# Patient Record
Sex: Male | Born: 1955
Health system: Southern US, Community
[De-identification: ages and names within clinical notes are randomized; demographics above are authoritative.]

## PROBLEM LIST (undated history)

## (undated) DIAGNOSIS — J309 Allergic rhinitis, unspecified: Secondary | ICD-10-CM

## (undated) DIAGNOSIS — I10 Essential (primary) hypertension: Secondary | ICD-10-CM

## (undated) DIAGNOSIS — K603 Anal fistula, unspecified: Secondary | ICD-10-CM

## (undated) DIAGNOSIS — G8929 Other chronic pain: Secondary | ICD-10-CM

## (undated) DIAGNOSIS — R0600 Dyspnea, unspecified: Secondary | ICD-10-CM

## (undated) DIAGNOSIS — M1990 Unspecified osteoarthritis, unspecified site: Secondary | ICD-10-CM

## (undated) DIAGNOSIS — K219 Gastro-esophageal reflux disease without esophagitis: Secondary | ICD-10-CM

## (undated) DIAGNOSIS — G4733 Obstructive sleep apnea (adult) (pediatric): Secondary | ICD-10-CM

## (undated) DIAGNOSIS — E1142 Type 2 diabetes mellitus with diabetic polyneuropathy: Secondary | ICD-10-CM

## (undated) DIAGNOSIS — H269 Unspecified cataract: Secondary | ICD-10-CM

## (undated) DIAGNOSIS — E119 Type 2 diabetes mellitus without complications: Secondary | ICD-10-CM

## (undated) DIAGNOSIS — Z9989 Dependence on other enabling machines and devices: Secondary | ICD-10-CM

## (undated) DIAGNOSIS — Z9889 Other specified postprocedural states: Secondary | ICD-10-CM

## (undated) DIAGNOSIS — IMO0002 Reserved for concepts with insufficient information to code with codable children: Secondary | ICD-10-CM

## (undated) DIAGNOSIS — E785 Hyperlipidemia, unspecified: Secondary | ICD-10-CM

## (undated) DIAGNOSIS — R112 Nausea with vomiting, unspecified: Secondary | ICD-10-CM

## (undated) HISTORY — DX: Morbid (severe) obesity due to excess calories: E66.01

## (undated) HISTORY — PX: CERVICAL FUSION: SHX112

## (undated) HISTORY — DX: Hyperlipidemia, unspecified: E78.5

## (undated) HISTORY — DX: Essential (primary) hypertension: I10

## (undated) HISTORY — DX: Allergic rhinitis, unspecified: J30.9

## (undated) HISTORY — DX: Other chronic pain: G89.29

## (undated) HISTORY — DX: Type 2 diabetes mellitus with diabetic polyneuropathy: E11.42

## (undated) HISTORY — DX: Unspecified cataract: H26.9

## (undated) HISTORY — PX: SHOULDER ARTHROSCOPY WITH ROTATOR CUFF REPAIR: SHX5685

## (undated) HISTORY — DX: Unspecified osteoarthritis, unspecified site: M19.90

## (undated) HISTORY — DX: Type 2 diabetes mellitus without complications: E11.9

---

## 1998-02-11 DIAGNOSIS — A084 Viral intestinal infection, unspecified: Secondary | ICD-10-CM

## 1998-02-11 HISTORY — DX: Viral intestinal infection, unspecified: A08.4

## 1999-07-26 ENCOUNTER — Emergency Department (HOSPITAL_COMMUNITY): Admission: EM | Admit: 1999-07-26 | Discharge: 1999-07-26 | Payer: Self-pay | Admitting: Emergency Medicine

## 1999-07-27 ENCOUNTER — Encounter: Payer: Self-pay | Admitting: Emergency Medicine

## 2001-06-17 ENCOUNTER — Emergency Department (HOSPITAL_COMMUNITY): Admission: EM | Admit: 2001-06-17 | Discharge: 2001-06-17 | Payer: Self-pay | Admitting: *Deleted

## 2001-07-28 ENCOUNTER — Ambulatory Visit (HOSPITAL_BASED_OUTPATIENT_CLINIC_OR_DEPARTMENT_OTHER): Admission: RE | Admit: 2001-07-28 | Discharge: 2001-07-28 | Payer: Self-pay | Admitting: Urology

## 2001-07-28 HISTORY — PX: OTHER SURGICAL HISTORY: SHX169

## 2002-01-01 ENCOUNTER — Ambulatory Visit (HOSPITAL_COMMUNITY): Admission: RE | Admit: 2002-01-01 | Discharge: 2002-01-01 | Payer: Self-pay | Admitting: *Deleted

## 2002-01-01 ENCOUNTER — Encounter: Payer: Self-pay | Admitting: *Deleted

## 2002-02-16 ENCOUNTER — Encounter: Payer: Self-pay | Admitting: *Deleted

## 2002-02-16 ENCOUNTER — Emergency Department (HOSPITAL_COMMUNITY): Admission: EM | Admit: 2002-02-16 | Discharge: 2002-02-16 | Payer: Self-pay | Admitting: *Deleted

## 2002-04-09 ENCOUNTER — Encounter: Payer: Self-pay | Admitting: Family Medicine

## 2002-04-09 ENCOUNTER — Ambulatory Visit (HOSPITAL_COMMUNITY): Admission: RE | Admit: 2002-04-09 | Discharge: 2002-04-09 | Payer: Self-pay | Admitting: Family Medicine

## 2006-06-13 ENCOUNTER — Ambulatory Visit: Payer: Self-pay | Admitting: Internal Medicine

## 2008-02-12 HISTORY — PX: LUMBAR FUSION: SHX111

## 2008-05-04 ENCOUNTER — Ambulatory Visit: Admission: RE | Admit: 2008-05-04 | Discharge: 2008-05-04 | Payer: Self-pay | Admitting: Family Medicine

## 2008-11-24 ENCOUNTER — Ambulatory Visit (HOSPITAL_COMMUNITY): Admission: RE | Admit: 2008-11-24 | Discharge: 2008-11-24 | Payer: Self-pay | Admitting: Neurosurgery

## 2010-06-26 NOTE — Procedures (Signed)
NAMEJOSPH, Evan Smith              ACCOUNT NO.:  [id number]   MEDICAL RECORD NO.:  [id number]          PATIENT TYPE:  OUT   LOCATION:  SLEE                          FACILITY:  [facility]   PHYSICIAN:  Kofi A. Gerilyn Pilgrim, M.D. DATE OF BIRTH:  Jun 12, 1955   DATE OF PROCEDURE:  05/04/2008  DATE OF DISCHARGE:  05/04/2008                             SLEEP DISORDER REPORT   REFERRING PHYSICIAN:  Ernestina Penna, M.D.   INDICATIONS:  A 52-year lady who presents with hyposomnia, snoring,  witnessed apnea and is being evaluated for obstructive sleep apnea  syndrome.   MEDICATIONS:  Singulair, amlodipine, benazepril, aspirin.   Epworth sleepiness scale 18.  BMI 42.   ARCHITECTURAL SUMMARY:  This is a split night recording with the first  half being a diagnostic and the second a titration. The total recording  time is 392 minutes.  Sleep efficiency 89%, sleep latency 13 minutes,  REM latency 53 minutes.   RESPIRATORY SUMMARY:  Baseline oxygen saturation 97%.  Lowest saturation  84%. The diagnostic AHI is 23.  The patient was titrated pressures  between 5 and 16 with the optimal pressure being 16 resulting in  resolution of obstructive events.   LIMB MOVEMENT SUMMARY:  PLM index 0.   ELECTROCARDIOGRAM SUMMARY:  Average heart rate 77 with isolated PVCs  observed.   IMPRESSION:  Moderate obstructive sleep apnea syndrome which responds  well to a CPAP of 16.  Thanks for this referral      Kofi A. Gerilyn Pilgrim, M.D.  Electronically Signed     KAD/MEDQ  D:  05/16/2008  T:  05/16/2008  Job:  [id number]

## 2010-06-29 NOTE — Op Note (Signed)
[facility]  Patient:    Evan Smith, Evan Smith Visit Number: [id number] MRN: [id number]          Service Type: NES Location: NESC Attending Physician:  Evlyn Clines Dictated by:   Excell Seltzer. Annabell Howells, M.D. Proc. Date: 07/28/01 Admit Date:  07/28/2001   CC:         Caren Macadam, M.D. [facility]   Operative Report  PROCEDURE:  Right hydrocelectomy.  PREOPERATIVE DIAGNOSIS:  Right hydrocele.  POSTOPERATIVE DIAGNOSIS:  Right hydrocele.  SURGEON:  Excell Seltzer. Annabell Howells, M.D.  ANESTHESIA:  General.  COMPLICATIONS:  None.  DRAIN:  Penrose.  INDICATIONS:  Mr. Melhorn is a 55 year old black male with a grapefruit-size, symptomatic right hydrocele.  He is to undergo hydrocelectomy.  FINDINGS AND PROCEDURE:  He was taken to the operating room where he was placed in a supine position.  A general anesthetic was induced.  His scrotum was shaved.  He was prepped with Betadine solution and draped in the usual sterile fashion.  An oblique right anterior scrotal incision was made along the skin folds with a knife.  This was carried down through the dartos with the Bovie.  The testicle within the hydrocele sac was delivered from the wound.  The hydrocele sac was opened, and the redundant sac was excised.  The sac was then embrocated behind the testicle in a water-bottle fashion with a running lock 3-0 chromic stitch.  Once hemostasis had been achieved, the testicle was delivered back into the right hemiscrotum.  A quarter-inch Penrose drain was placed through a separate stab wound in the dependent portion of the scrotum.  It was initially secured with a towel clip.  The wound was then closed in two layers using a running 3-0 chromic on the dartos and a running vertical mattress 3-0 chromic on the skin.  The drain was trimmed to length.  The towel clip was removed.  A dressing of 4 x 4 fluff Kerlix and a scrotal support was applied.  The  patients anesthetic was then reversed.  He was removed to the recovery room in stable condition.  There were no complications. Dictated by:   Excell Seltzer. Annabell Howells, M.D. Attending Physician:  Evlyn Clines DD:  07/28/01 TD:  07/29/01 Job: [id number]

## 2012-02-06 ENCOUNTER — Encounter (HOSPITAL_COMMUNITY): Payer: Self-pay | Admitting: Emergency Medicine

## 2012-02-06 ENCOUNTER — Emergency Department (HOSPITAL_COMMUNITY)
Admission: EM | Admit: 2012-02-06 | Discharge: 2012-02-06 | Disposition: A | Discharge status: Home / Self Care | Payer: Medicare Other | Attending: Emergency Medicine | Admitting: Emergency Medicine

## 2012-02-06 DIAGNOSIS — Z87891 Personal history of nicotine dependence: Secondary | ICD-10-CM | POA: Insufficient documentation

## 2012-02-06 DIAGNOSIS — R197 Diarrhea, unspecified: Secondary | ICD-10-CM | POA: Insufficient documentation

## 2012-02-06 DIAGNOSIS — Z79899 Other long term (current) drug therapy: Secondary | ICD-10-CM | POA: Insufficient documentation

## 2012-02-06 DIAGNOSIS — Z7982 Long term (current) use of aspirin: Secondary | ICD-10-CM | POA: Insufficient documentation

## 2012-02-06 DIAGNOSIS — K649 Unspecified hemorrhoids: Secondary | ICD-10-CM

## 2012-02-06 DIAGNOSIS — K644 Residual hemorrhoidal skin tags: Secondary | ICD-10-CM | POA: Insufficient documentation

## 2012-02-06 MED ORDER — DIPHENOXYLATE-ATROPINE 2.5-0.025 MG PO TABS
2.0000 | ORAL_TABLET | Freq: Once | ORAL | Status: AC
  Administered 2012-02-06: 2 via ORAL
  Filled 2012-02-06: qty 2

## 2012-02-06 MED ORDER — LIDOCAINE VISCOUS 2 % MT SOLN
OROMUCOSAL | Status: AC
  Filled 2012-02-06: qty 15

## 2012-02-06 MED ORDER — PRAMOXINE HCL 1 % RE FOAM: RECTAL | Status: DC | PRN

## 2012-02-06 MED ORDER — HYDROCORTISONE ACETATE 25 MG RE SUPP
25.0000 mg | Freq: Two times a day (BID) | RECTAL | Status: DC
  Filled 2012-02-06 (×3): qty 1

## 2012-02-06 MED ORDER — ONDANSETRON 4 MG PO TBDP
4.0000 mg | ORAL_TABLET | Freq: Once | ORAL | Status: AC
  Administered 2012-02-06: 4 mg via ORAL
  Filled 2012-02-06: qty 1

## 2012-02-06 MED ORDER — HYDROCODONE-ACETAMINOPHEN 5-325 MG PO TABS
1.0000 | ORAL_TABLET | Freq: Once | ORAL | Status: AC
  Administered 2012-02-06: 1 via ORAL
  Filled 2012-02-06: qty 1

## 2012-02-06 MED ORDER — HYDROCORTISONE ACETATE 25 MG RE SUPP: 25.0000 mg | Freq: Two times a day (BID) | RECTAL | Status: DC

## 2012-02-06 MED ORDER — HYDROCORTISONE 2.5 % RE CREA: TOPICAL_CREAM | RECTAL | Status: DC

## 2012-02-06 NOTE — ED Notes (Addendum)
Has tried creams, suppositories, "everything" with no relief.  Has small spots of bleeding from rectum at intervals.  Pain at all times - walking, sitting and currently while positioned on right side.

## 2012-02-06 NOTE — ED Provider Notes (Signed)
History     CSN: [id number]  Arrival date & time 02/06/12  1478   First MD Initiated Contact with Patient 02/06/12 [phone]      Chief Complaint  Patient presents with  . Hemorrhoids    (Consider location/radiation/quality/duration/timing/severity/associated sxs/prior treatment) HPI Evan Smith is a 56 y.o. male who presents to the Emergency Department complaining of diarrhea illness resulting in  hemorrhoid pain for a week. No nausea or vomiting. Hemorroids now very painful with wiping.  History reviewed. No pertinent past medical history.  Past Surgical History  Procedure Date  . Back surgery   . Shoulder arthroscopy with rotator cuff repair     LEFT  . Spine surgery     screws  - due to injury - ruptured disc  . Cervical disc surgery   . Hydrocele excision     No family history on file.  History  Substance Use Topics  . Smoking status: Former Games developer  . Smokeless tobacco: Not on file  . Alcohol Use: Yes     Comment: occassional      Review of Systems  Constitutional: Negative for fever.       10 Systems reviewed and are negative for acute change except as noted in the HPI.  HENT: Negative for congestion.   Eyes: Negative for discharge and redness.  Respiratory: Negative for cough and shortness of breath.   Cardiovascular: Negative for chest pain.  Gastrointestinal: Positive for rectal pain. Negative for vomiting and abdominal pain.  Musculoskeletal: Negative for back pain.  Skin: Negative for rash.  Neurological: Negative for syncope, numbness and headaches.  Psychiatric/Behavioral:       No behavior change.    Allergies  Hydrocodeine  Home Medications   Current Outpatient Rx  Name  Route  Sig  Dispense  Refill  . ASPIRIN 81 MG PO TABS   Oral   Take 81 mg by mouth daily.         . CHOLECALCIFEROL 400 UNITS PO TABS   Oral   Take 2,000 Units by mouth.         . MELOXICAM 15 MG PO TABS   Oral   Take 15 mg by mouth daily.         Marland Kitchen  OLMESARTAN MEDOXOMIL-HCTZ 40-25 MG PO TABS   Oral   Take 1 tablet by mouth daily.         Marland Kitchen OMEPRAZOLE 40 MG PO CPDR   Oral   Take 40 mg by mouth daily.         Marland Kitchen SIMVASTATIN 20 MG PO TABS   Oral   Take 20 mg by mouth every evening.           BP 125/70  Pulse 90  Temp 98.7 F (37.1 C) (Oral)  Resp 20  Ht 5\' 8"  (1.727 m)  Wt 300 lb (136.079 kg)  BMI 45.61 kg/m2  SpO2 97%  Physical Exam  Nursing note and vitals reviewed. Constitutional:       Awake, alert, nontoxic appearance.  HENT:  Head: Atraumatic.  Eyes: Right eye exhibits no discharge. Left eye exhibits no discharge.  Neck: Neck supple.  Cardiovascular: Normal heart sounds.   Pulmonary/Chest: Effort normal and breath sounds normal. He exhibits no tenderness.  Abdominal: Soft. There is no tenderness. There is no rebound.  Genitourinary:       External hemorrhoid with erythema, no thrombosis. Extremely tender with digital exam. Hemorrhoids at 4 and 8 o'clock position, no thrombosis.  Musculoskeletal:  He exhibits no tenderness.       Baseline ROM, no obvious new focal weakness.  Neurological:       Mental status and motor strength appears baseline for patient and situation.  Skin: No rash noted.  Psychiatric: He has a normal mood and affect.    ED Course  Procedures (including critical care time)     MDM  Patient with diarrheal illness causing hemorrhoid pain. Given analgesic, antiemetic, antidiarrheal . Pt stable in ED with no significant deterioration in condition.The patient appears reasonably screened and/or stabilized for discharge and I doubt any other medical condition or other [facility] requiring further screening, evaluation, or treatment in the ED at this time prior to discharge.  MDM Reviewed: nursing note and vitals           Nicoletta Dress. Colon Branch, MD 02/06/12 0630

## 2012-02-06 NOTE — ED Notes (Signed)
Onset of painful hemorrhoids a week ago, then two days ago more frequent bowel movements - loose and caused irritation to rectal area.  Painful now, cannot tolerate tissue touching him

## 2012-02-10 ENCOUNTER — Encounter (HOSPITAL_COMMUNITY): Payer: Self-pay | Admitting: Emergency Medicine

## 2012-02-10 ENCOUNTER — Emergency Department (HOSPITAL_COMMUNITY)
Admission: EM | Admit: 2012-02-10 | Discharge: 2012-02-10 | Disposition: A | Discharge status: Home / Self Care | Payer: Medicare Other | Attending: Emergency Medicine | Admitting: Emergency Medicine

## 2012-02-10 ENCOUNTER — Encounter (HOSPITAL_COMMUNITY): Payer: Self-pay | Admitting: *Deleted

## 2012-02-10 ENCOUNTER — Emergency Department (HOSPITAL_COMMUNITY): Payer: Medicare Other

## 2012-02-10 DIAGNOSIS — R198 Other specified symptoms and signs involving the digestive system and abdomen: Secondary | ICD-10-CM | POA: Insufficient documentation

## 2012-02-10 DIAGNOSIS — K649 Unspecified hemorrhoids: Secondary | ICD-10-CM

## 2012-02-10 DIAGNOSIS — K612 Anorectal abscess: Secondary | ICD-10-CM | POA: Insufficient documentation

## 2012-02-10 DIAGNOSIS — Z9889 Other specified postprocedural states: Secondary | ICD-10-CM | POA: Insufficient documentation

## 2012-02-10 DIAGNOSIS — Z7982 Long term (current) use of aspirin: Secondary | ICD-10-CM | POA: Insufficient documentation

## 2012-02-10 DIAGNOSIS — Z79899 Other long term (current) drug therapy: Secondary | ICD-10-CM | POA: Insufficient documentation

## 2012-02-10 DIAGNOSIS — Z87891 Personal history of nicotine dependence: Secondary | ICD-10-CM | POA: Insufficient documentation

## 2012-02-10 DIAGNOSIS — K6289 Other specified diseases of anus and rectum: Secondary | ICD-10-CM | POA: Insufficient documentation

## 2012-02-10 DIAGNOSIS — K5732 Diverticulitis of large intestine without perforation or abscess without bleeding: Secondary | ICD-10-CM | POA: Insufficient documentation

## 2012-02-10 DIAGNOSIS — K610 Anal abscess: Secondary | ICD-10-CM

## 2012-02-10 DIAGNOSIS — K5792 Diverticulitis of intestine, part unspecified, without perforation or abscess without bleeding: Secondary | ICD-10-CM

## 2012-02-10 DIAGNOSIS — K5900 Constipation, unspecified: Secondary | ICD-10-CM | POA: Insufficient documentation

## 2012-02-10 DIAGNOSIS — Z8669 Personal history of other diseases of the nervous system and sense organs: Secondary | ICD-10-CM | POA: Insufficient documentation

## 2012-02-10 LAB — BASIC METABOLIC PANEL
Calcium: 9.9 mg/dL (ref 8.4–10.5)
GFR calc Af Amer: 61 mL/min — ABNORMAL LOW (ref >90)
GFR calc non Af Amer: 53 mL/min — ABNORMAL LOW (ref >90)
Glucose, Bld: 105 mg/dL — ABNORMAL HIGH (ref 70–99)
Potassium: 4.4 mEq/L (ref 3.5–5.1)
Sodium: 137 mEq/L (ref 135–145)

## 2012-02-10 LAB — CBC WITH DIFFERENTIAL/PLATELET
Basophils Absolute: 0 10*3/uL (ref 0.0–0.1)
Basophils Relative: 0 % (ref 0–1)
Eosinophils Absolute: 0.1 10*3/uL (ref 0.0–0.7)
Lymphs Abs: 1.4 10*3/uL (ref 0.7–4.0)
MCH: 31.8 pg (ref 26.0–34.0)
Neutrophils Relative %: 76 % (ref 43–77)
Platelets: 202 10*3/uL (ref 150–400)
RBC: 4.06 MIL/uL — ABNORMAL LOW (ref 4.22–5.81)
RDW: 13.3 % (ref 11.5–15.5)

## 2012-02-10 MED ORDER — HYDROCODONE-ACETAMINOPHEN 5-325 MG PO TABS
1.0000 | ORAL_TABLET | Freq: Once | ORAL | Status: AC
  Administered 2012-02-10: 1 via ORAL
  Filled 2012-02-10: qty 1

## 2012-02-10 MED ORDER — OXYCODONE-ACETAMINOPHEN 5-325 MG PO TABS
1.0000 | ORAL_TABLET | Freq: Once | ORAL | Status: AC
  Administered 2012-02-10: 1 via ORAL
  Filled 2012-02-10: qty 1

## 2012-02-10 MED ORDER — ONDANSETRON HCL 4 MG/2ML IJ SOLN: 4.0000 mg | Freq: Once | INTRAMUSCULAR | Status: DC

## 2012-02-10 MED ORDER — HYDROMORPHONE HCL PF 1 MG/ML IJ SOLN: 0.5000 mg | Freq: Once | INTRAMUSCULAR | Status: DC

## 2012-02-10 MED ORDER — METRONIDAZOLE 500 MG PO TABS: 500.0000 mg | ORAL_TABLET | Freq: Four times a day (QID) | ORAL | Status: DC

## 2012-02-10 MED ORDER — HYDROCODONE-ACETAMINOPHEN 5-325 MG PO TABS: 1.0000 | ORAL_TABLET | ORAL | Status: DC | PRN

## 2012-02-10 MED ORDER — CIPROFLOXACIN HCL 750 MG PO TABS: 750.0000 mg | ORAL_TABLET | Freq: Two times a day (BID) | ORAL | Status: DC

## 2012-02-10 MED ORDER — ONDANSETRON 8 MG PO TBDP: 8.0000 mg | ORAL_TABLET | Freq: Three times a day (TID) | ORAL | Status: DC | PRN

## 2012-02-10 MED ORDER — SODIUM CHLORIDE 0.9 % IV BOLUS (SEPSIS): 500.0000 mL | Freq: Once | INTRAVENOUS | Status: DC

## 2012-02-10 MED ORDER — POLYETHYLENE GLYCOL 3350 17 GM/SCOOP PO POWD: 17.0000 g | Freq: Every day | ORAL | Status: DC

## 2012-02-10 NOTE — ED Notes (Signed)
Pt sent from Dr. Malvin Johns office for rectal abscess.

## 2012-02-10 NOTE — ED Notes (Signed)
Pt returning to the er about hemorrhoids. Was seen this week about the same. Pt states so tender now he cant take it. Needing something for pain.

## 2012-02-10 NOTE — ED Notes (Signed)
Patient states he was sent over for evaluation of rectal abscess by Dr. Malvin Johns.

## 2012-02-10 NOTE — ED Provider Notes (Signed)
History     CSN: [id number]  Arrival date & time 02/10/12  1218   First MD Initiated Contact with Patient 02/10/12 1423      Chief Complaint  Patient presents with  . Abscess    (Consider location/radiation/quality/duration/timing/severity/associated sxs/prior treatment) Patient is a 56 y.o. male presenting with abscess. The history is provided by the patient.  Abscess  Pertinent negatives include no diarrhea and no vomiting.   patient has had some rectal pain over the last week. His been seen in ER twice and was diagnosed with hemorrhoids. He followed up today with Dr. Malvin Johns from general surgery. He reportedly put a probe or cotton swab internally in his rectum and had clot and purulence come out. Patient states he was sent in to get a CT scan. No fevers. He states he has had decreased bowel movements. He has not had episodes of this before. Dr. Malvin Johns called and states he does not want to see the patient in followup. No abdominal pain.  History reviewed. No pertinent past medical history.  Past Surgical History  Procedure Date  . Back surgery   . Shoulder arthroscopy with rotator cuff repair     LEFT  . Spine surgery     screws  - due to injury - ruptured disc  . Cervical disc surgery   . Hydrocele excision     History reviewed. No pertinent family history.  History  Substance Use Topics  . Smoking status: Former Games developer  . Smokeless tobacco: Not on file  . Alcohol Use: Yes     Comment: occassional      Review of Systems  Constitutional: Negative for activity change and appetite change.  HENT: Negative for neck stiffness.   Eyes: Negative for pain.  Respiratory: Negative for chest tightness and shortness of breath.   Cardiovascular: Negative for chest pain and leg swelling.  Gastrointestinal: Positive for constipation and rectal pain. Negative for nausea, vomiting, abdominal pain and diarrhea.  Genitourinary: Negative for flank pain and discharge.    Musculoskeletal: Negative for back pain.  Skin: Negative for rash.  Neurological: Negative for weakness, numbness and headaches.  Psychiatric/Behavioral: Negative for behavioral problems.    Allergies  Hydrocodeine  Home Medications   Current Outpatient Rx  Name  Route  Sig  Dispense  Refill  . OLMESARTAN-AMLODIPINE-HCTZ 40-5-25 MG PO TABS   Oral   Take 1 tablet by mouth daily.         . ASPIRIN 81 MG PO TABS   Oral   Take 81 mg by mouth daily.         . CHOLECALCIFEROL 400 UNITS PO TABS   Oral   Take 2,000 Units by mouth.         Marland Kitchen CIPROFLOXACIN HCL 750 MG PO TABS   Oral   Take 1 tablet (750 mg total) by mouth 2 (two) times daily.   20 tablet   0   . HYDROCODONE-ACETAMINOPHEN 5-325 MG PO TABS   Oral   Take 1 tablet by mouth every 4 (four) hours as needed for pain.   15 tablet   0   . HYDROCORTISONE ACETATE 25 MG RE SUPP   Rectal   Place 1 suppository (25 mg total) rectally 2 (two) times daily.   12 suppository   0   . MELOXICAM 15 MG PO TABS   Oral   Take 15 mg by mouth daily.         Marland Kitchen METRONIDAZOLE 500 MG PO TABS  Oral   Take 1 tablet (500 mg total) by mouth 4 (four) times daily.   50 tablet   0   . OMEPRAZOLE 40 MG PO CPDR   Oral   Take 40 mg by mouth daily.         Marland Kitchen POLYETHYLENE GLYCOL 3350 PO POWD   Oral   Take 17 g by mouth daily.   255 g   0   . PRAMOXINE HCL 1 % RE FOAM   Rectal   Place rectally every 2 (two) hours as needed for hemorrhoids.   15 g   0   . SIMVASTATIN 20 MG PO TABS   Oral   Take 20 mg by mouth every evening.           BP 118/58  Pulse 98  Temp 99.3 F (37.4 C) (Oral)  Resp 20  Ht 5\' 8"  (1.727 m)  Wt 300 lb (136.079 kg)  BMI 45.61 kg/m2  SpO2 99%  Physical Exam  Nursing note and vitals reviewed. Constitutional: He is oriented to person, place, and time. He appears well-developed and well-nourished.  HENT:  Head: Normocephalic and atraumatic.  Eyes: EOM are normal. Pupils are equal,  round, and reactive to light.  Neck: Normal range of motion. Neck supple.  Cardiovascular: Normal rate, regular rhythm and normal heart sounds.   No murmur heard. Pulmonary/Chest: Effort normal and breath sounds normal.  Abdominal: Soft. Bowel sounds are normal. He exhibits no distension and no mass. There is no tenderness. There is no rebound and no guarding.       Some tenderness on external rectal exam. Internal rectal exam not done.  Musculoskeletal: Normal range of motion. He exhibits no edema.  Neurological: He is alert and oriented to person, place, and time. No cranial nerve deficit.  Skin: Skin is warm and dry.  Psychiatric: He has a normal mood and affect.    ED Course  Procedures (including critical care time)  Labs Reviewed  CBC WITH DIFFERENTIAL - Abnormal; Notable for the following:    RBC 4.06 (*)     Hemoglobin 12.9 (*)     HCT 37.8 (*)     All other components within normal limits  BASIC METABOLIC PANEL - Abnormal; Notable for the following:    Glucose, Bld 105 (*)     Creatinine, Ser 1.44 (*)     GFR calc non Af Amer 53 (*)     GFR calc Af Amer 61 (*)     All other components within normal limits   Ct Pelvis Wo Contrast  02/10/2012  *RADIOLOGY REPORT*  Clinical Data:  Chronic rectal pain.  Worsening over the past week. Question perirectal abscess.  Unable to obtain IV contrast.  CT PELVIS WITHOUT CONTRAST  Technique:  Multidetector CT imaging of the pelvis was performed following the standard protocol without intravenous contrast.  Comparison:  None  Findings:  Decrease sensitivity exam secondary to unenhanced technique.  There is subtle asymmetry to the perianal fat plans, eccentric right, including on image 52/series 2.  No well-defined fluid or gas collection is seen.  The appearance of the rectum and perirectal fat is normal.  Sigmoid diverticulosis.  A suggestion of mild pericolonic edema and fascial thickening adjacent the descending/sigmoid junction, including  on image 18/series 2.  Normal terminal ileum and appendix.  Pelvic small bowel loops are within normal limits.  Mildly prominent bilateral inguinal nodes, which are likely reactive. Normal urinary bladder and prostate.  No significant free  fluid.  Lumbosacral spine fixation.  Right-sided iliac sclerosis is favored to be degenerative.  Similar to 2010.  IMPRESSION:  1.  Low sensitivity exam, secondary to unenhanced CT technique. 2.  Subtle soft tissue asymmetry about the right perianal region. Cannot exclude cellulitis or a tiny perianal abscess/fistula.  The test of choice is dedicated anorectal MRI with contrast.  This could be performed as an outpatient. 3.  Findings suspicious for descending/sigmoid colon junction diverticulitis.   Original Report Authenticated By: Jeronimo Greaves, M.D.      1. Perianal abscess   2. Diverticulitis       MDM  Patient sent from Dr. Daisy Blossom office. possible hernia or abscess. We have been unable to get IV access on him. CT scan was done with only oral contrast. It showed possible perianal cellulitis versus tiny abscess. No clear drainability on my exam. Patient also had diverticulitis on the CT. He is not tender on the abdomen, but has had some bowel symptoms. He'll be treated with antibiotics and will followup with Dr. Leretha Pol.Marland Kitchen He        Juliet Rude. Rubin Payor, MD 02/10/12 [phone]

## 2012-02-10 NOTE — ED Notes (Signed)
No IV access able to be obtained. MD aware, given verbal order to do CT without contrast.

## 2012-02-10 NOTE — ED Provider Notes (Signed)
History     CSN: [id number]  Arrival date & time 02/10/12  0448   First MD Initiated Contact with Patient 02/10/12 [phone]      Chief Complaint  Patient presents with  . Hemorrhoids    (Consider location/radiation/quality/duration/timing/severity/associated sxs/prior treatment) HPI Evan Smith is a 56 y.o. male who presents to the Emergency Department complaining of continuing hemorrhoid pain. He was seen in the ER last week and given both protofoam and suppositories. He has used both with some improvement in pain. The diarrhea illness he had last week has resolved. The pain with wiping has become worse. He is here for pain relief. He has been using Preparation H with no relief.  History reviewed. No pertinent past medical history.  Past Surgical History  Procedure Date  . Back surgery   . Shoulder arthroscopy with rotator cuff repair     LEFT  . Spine surgery     screws  - due to injury - ruptured disc  . Cervical disc surgery   . Hydrocele excision     No family history on file.  History  Substance Use Topics  . Smoking status: Former Games developer  . Smokeless tobacco: Not on file  . Alcohol Use: Yes     Comment: occassional      Review of Systems  Constitutional: Negative for fever.       10 Systems reviewed and are negative for acute change except as noted in the HPI.  HENT: Negative for congestion.   Eyes: Negative for discharge and redness.  Respiratory: Negative for cough and shortness of breath.   Cardiovascular: Negative for chest pain.  Gastrointestinal: Negative for vomiting and abdominal pain.       Anal pain  Musculoskeletal: Negative for back pain.  Skin: Negative for rash.  Neurological: Negative for syncope, numbness and headaches.  Psychiatric/Behavioral:       No behavior change.    Allergies  Hydrocodeine  Home Medications   Current Outpatient Rx  Name  Route  Sig  Dispense  Refill  . ASPIRIN 81 MG PO TABS   Oral   Take 81 mg by mouth  daily.         . CHOLECALCIFEROL 400 UNITS PO TABS   Oral   Take 2,000 Units by mouth.         Marland Kitchen HYDROCORTISONE 2.5 % RE CREA      Apply rectally 2 times daily   30 g   0   . HYDROCORTISONE ACETATE 25 MG RE SUPP   Rectal   Place 1 suppository (25 mg total) rectally 2 (two) times daily.   12 suppository   0   . MELOXICAM 15 MG PO TABS   Oral   Take 15 mg by mouth daily.         Marland Kitchen OLMESARTAN MEDOXOMIL-HCTZ 40-25 MG PO TABS   Oral   Take 1 tablet by mouth daily.         Marland Kitchen OMEPRAZOLE 40 MG PO CPDR   Oral   Take 40 mg by mouth daily.         Marland Kitchen PRAMOXINE HCL 1 % RE FOAM   Rectal   Place rectally every 2 (two) hours as needed for hemorrhoids.   15 g   0   . SIMVASTATIN 20 MG PO TABS   Oral   Take 20 mg by mouth every evening.           BP 149/79  Pulse 92  Temp 97.9 F (36.6 C) (Oral)  Resp 18  Ht 5\' 8"  (1.727 m)  Wt 300 lb (136.079 kg)  BMI 45.61 kg/m2  SpO2 97%  Physical Exam  Nursing note and vitals reviewed. Constitutional:       Awake, alert, nontoxic appearance.  HENT:  Head: Atraumatic.  Eyes: Right eye exhibits no discharge. Left eye exhibits no discharge.  Neck: Neck supple.  Cardiovascular: Normal heart sounds.   Pulmonary/Chest: Effort normal and breath sounds normal. He exhibits no tenderness.  Abdominal: Soft. There is no tenderness. There is no rebound.  Musculoskeletal: He exhibits no tenderness.       Baseline ROM, no obvious new focal weakness.  Neurological:       Mental status and motor strength appears baseline for patient and situation.  Skin: No rash noted.  Psychiatric: He has a normal mood and affect.    ED Course  Procedures (including critical care time)     MDM  Patient presents with continued hemorrhoidal pain. Given analgesic. Referral to general surgery. Pt stable in ED with no significant deterioration in condition.The patient appears reasonably screened and/or stabilized for discharge and I doubt any  other medical condition or other [facility] requiring further screening, evaluation, or treatment in the ED at this time prior to discharge.  MDM Reviewed: nursing note, previous chart and vitals            Nicoletta Dress. Colon Branch, MD 02/10/12 [phone]

## 2012-02-13 ENCOUNTER — Encounter (HOSPITAL_COMMUNITY)
Admission: EM | Disposition: A | Discharge status: Home / Self Care | Payer: Self-pay | Source: Home / Self Care | Attending: Emergency Medicine

## 2012-02-13 ENCOUNTER — Inpatient Hospital Stay: Admit: 2012-02-13 | Payer: Self-pay | Admitting: Surgery

## 2012-02-13 ENCOUNTER — Encounter (HOSPITAL_COMMUNITY): Payer: Self-pay | Admitting: Certified Registered"

## 2012-02-13 ENCOUNTER — Emergency Department (HOSPITAL_COMMUNITY): Payer: Medicare Other | Admitting: Certified Registered"

## 2012-02-13 ENCOUNTER — Encounter (HOSPITAL_COMMUNITY): Payer: Self-pay | Admitting: Cardiology

## 2012-02-13 ENCOUNTER — Observation Stay (HOSPITAL_COMMUNITY)
Admission: EM | Admit: 2012-02-13 | Discharge: 2012-02-16 | Disposition: A | Discharge status: Home / Self Care | Payer: Medicare Other | Attending: Surgery | Admitting: Surgery

## 2012-02-13 ENCOUNTER — Encounter (HOSPITAL_COMMUNITY): Payer: Self-pay | Admitting: Surgery

## 2012-02-13 DIAGNOSIS — I10 Essential (primary) hypertension: Secondary | ICD-10-CM | POA: Insufficient documentation

## 2012-02-13 DIAGNOSIS — K611 Rectal abscess: Secondary | ICD-10-CM | POA: Diagnosis present

## 2012-02-13 DIAGNOSIS — E785 Hyperlipidemia, unspecified: Secondary | ICD-10-CM | POA: Insufficient documentation

## 2012-02-13 DIAGNOSIS — K612 Anorectal abscess: Secondary | ICD-10-CM

## 2012-02-13 DIAGNOSIS — G4730 Sleep apnea, unspecified: Secondary | ICD-10-CM | POA: Insufficient documentation

## 2012-02-13 DIAGNOSIS — K6289 Other specified diseases of anus and rectum: Secondary | ICD-10-CM | POA: Insufficient documentation

## 2012-02-13 HISTORY — PX: INCISION AND DRAINAGE PERIRECTAL ABSCESS: SHX1804

## 2012-02-13 LAB — CBC WITH DIFFERENTIAL/PLATELET
Hemoglobin: 12.1 g/dL — ABNORMAL LOW (ref 13.0–17.0)
Lymphocytes Relative: 6 % — ABNORMAL LOW (ref 12–46)
Lymphs Abs: 1 10*3/uL (ref 0.7–4.0)
Monocytes Relative: 9 % (ref 3–12)
Neutro Abs: 15.6 10*3/uL — ABNORMAL HIGH (ref 1.7–7.7)
Neutrophils Relative %: 86 % — ABNORMAL HIGH (ref 43–77)
Platelets: 248 10*3/uL (ref 150–400)
RBC: 3.89 MIL/uL — ABNORMAL LOW (ref 4.22–5.81)
WBC: 18.2 10*3/uL — ABNORMAL HIGH (ref 4.0–10.5)

## 2012-02-13 LAB — BASIC METABOLIC PANEL
BUN: 28 mg/dL — ABNORMAL HIGH (ref 6–23)
Chloride: 95 mEq/L — ABNORMAL LOW (ref 96–112)
GFR calc non Af Amer: 37 mL/min — ABNORMAL LOW (ref >90)
Glucose, Bld: 110 mg/dL — ABNORMAL HIGH (ref 70–99)
Potassium: 4.1 mEq/L (ref 3.5–5.1)
Sodium: 134 mEq/L — ABNORMAL LOW (ref 135–145)

## 2012-02-13 SURGERY — INCISION AND DRAINAGE, ABSCESS, PERIRECTAL
Anesthesia: General | Site: Buttocks | Wound class: Dirty or Infected

## 2012-02-13 MED ORDER — SODIUM CHLORIDE 0.9 % IV SOLN
INTRAVENOUS | Status: DC
  Administered 2012-02-13: 13:00:00 via INTRAVENOUS

## 2012-02-13 MED ORDER — ONDANSETRON HCL 4 MG/2ML IJ SOLN
INTRAMUSCULAR | Status: DC | PRN
  Administered 2012-02-13: 8 mg via INTRAVENOUS

## 2012-02-13 MED ORDER — SODIUM CHLORIDE 0.9 % IR SOLN
Status: DC | PRN
  Administered 2012-02-13: 1

## 2012-02-13 MED ORDER — KCL IN DEXTROSE-NACL 30-5-0.45 MEQ/L-%-% IV SOLN
INTRAVENOUS | Status: DC
  Administered 2012-02-13: 20:00:00 via INTRAVENOUS
  Administered 2012-02-14: 1000 mL via INTRAVENOUS
  Filled 2012-02-13 (×7): qty 1000

## 2012-02-13 MED ORDER — MIDAZOLAM HCL 5 MG/5ML IJ SOLN
INTRAMUSCULAR | Status: DC | PRN
  Administered 2012-02-13: 2 mg via INTRAVENOUS

## 2012-02-13 MED ORDER — SODIUM CHLORIDE 0.9 % IV SOLN
3.0000 g | Freq: Four times a day (QID) | INTRAVENOUS | Status: DC
  Administered 2012-02-13 – 2012-02-14 (×3): 3 g via INTRAVENOUS
  Filled 2012-02-13 (×9): qty 3

## 2012-02-13 MED ORDER — FENTANYL CITRATE 0.05 MG/ML IJ SOLN
INTRAMUSCULAR | Status: DC | PRN
  Administered 2012-02-13: 50 ug via INTRAVENOUS
  Administered 2012-02-13: 100 ug via INTRAVENOUS
  Administered 2012-02-13 (×2): 50 ug via INTRAVENOUS

## 2012-02-13 MED ORDER — AMLODIPINE BESYLATE 5 MG PO TABS
5.0000 mg | ORAL_TABLET | Freq: Every day | ORAL | Status: DC
  Administered 2012-02-13 – 2012-02-16 (×4): 5 mg via ORAL
  Filled 2012-02-13 (×4): qty 1

## 2012-02-13 MED ORDER — HYDROMORPHONE HCL PF 1 MG/ML IJ SOLN
0.2500 mg | INTRAMUSCULAR | Status: DC | PRN
  Administered 2012-02-13 (×2): 0.5 mg via INTRAVENOUS

## 2012-02-13 MED ORDER — ONDANSETRON HCL 4 MG PO TABS
4.0000 mg | ORAL_TABLET | Freq: Four times a day (QID) | ORAL | Status: DC | PRN
  Administered 2012-02-16: 4 mg via ORAL
  Filled 2012-02-13: qty 1

## 2012-02-13 MED ORDER — HYDROMORPHONE HCL 2 MG PO TABS
2.0000 mg | ORAL_TABLET | ORAL | Status: DC | PRN
  Administered 2012-02-15 (×2): 2 mg via ORAL
  Filled 2012-02-13 (×2): qty 1

## 2012-02-13 MED ORDER — IRBESARTAN 300 MG PO TABS
300.0000 mg | ORAL_TABLET | Freq: Every day | ORAL | Status: DC
  Administered 2012-02-13 – 2012-02-16 (×4): 300 mg via ORAL
  Filled 2012-02-13 (×4): qty 1

## 2012-02-13 MED ORDER — METOCLOPRAMIDE HCL 5 MG/ML IJ SOLN
INTRAMUSCULAR | Status: DC | PRN
  Administered 2012-02-13: 10 mg via INTRAVENOUS

## 2012-02-13 MED ORDER — SUCCINYLCHOLINE CHLORIDE 20 MG/ML IJ SOLN
INTRAMUSCULAR | Status: DC | PRN
  Administered 2012-02-13: 140 mg via INTRAVENOUS

## 2012-02-13 MED ORDER — OXYCODONE HCL 5 MG/5ML PO SOLN: 5.0000 mg | Freq: Once | ORAL | Status: DC | PRN

## 2012-02-13 MED ORDER — HYDROMORPHONE HCL PF 1 MG/ML IJ SOLN
INTRAMUSCULAR | Status: AC
  Filled 2012-02-13: qty 1

## 2012-02-13 MED ORDER — LIDOCAINE HCL (CARDIAC) 20 MG/ML IV SOLN
INTRAVENOUS | Status: DC | PRN
  Administered 2012-02-13: 100 mg via INTRAVENOUS

## 2012-02-13 MED ORDER — MORPHINE SULFATE 4 MG/ML IJ SOLN
4.0000 mg | Freq: Once | INTRAMUSCULAR | Status: AC
  Administered 2012-02-13: 4 mg via INTRAVENOUS
  Filled 2012-02-13: qty 1

## 2012-02-13 MED ORDER — ONDANSETRON HCL 4 MG/2ML IJ SOLN: 4.0000 mg | Freq: Four times a day (QID) | INTRAMUSCULAR | Status: DC | PRN

## 2012-02-13 MED ORDER — PROPOFOL 10 MG/ML IV BOLUS
INTRAVENOUS | Status: DC | PRN
  Administered 2012-02-13: 200 mg via INTRAVENOUS

## 2012-02-13 MED ORDER — ARTIFICIAL TEARS OP OINT
TOPICAL_OINTMENT | OPHTHALMIC | Status: DC | PRN
  Administered 2012-02-13: 1 via OPHTHALMIC

## 2012-02-13 MED ORDER — HYDROMORPHONE HCL PF 1 MG/ML IJ SOLN
1.0000 mg | INTRAMUSCULAR | Status: DC | PRN
  Administered 2012-02-13 – 2012-02-14 (×12): 1 mg via INTRAVENOUS
  Filled 2012-02-13: qty 1
  Filled 2012-02-13: qty 2
  Filled 2012-02-13 (×10): qty 1

## 2012-02-13 MED ORDER — HYDROCHLOROTHIAZIDE 25 MG PO TABS
25.0000 mg | ORAL_TABLET | Freq: Every day | ORAL | Status: DC
  Administered 2012-02-13 – 2012-02-16 (×4): 25 mg via ORAL
  Filled 2012-02-13 (×4): qty 1

## 2012-02-13 MED ORDER — PHENYLEPHRINE HCL 10 MG/ML IJ SOLN
INTRAMUSCULAR | Status: DC | PRN
  Administered 2012-02-13 (×2): 120 ug via INTRAVENOUS
  Administered 2012-02-13: 160 ug via INTRAVENOUS
  Administered 2012-02-13: 120 ug via INTRAVENOUS

## 2012-02-13 MED ORDER — OXYCODONE HCL 5 MG PO TABS: 5.0000 mg | ORAL_TABLET | Freq: Once | ORAL | Status: DC | PRN

## 2012-02-13 MED ORDER — ACETAMINOPHEN 325 MG PO TABS: 650.0000 mg | ORAL_TABLET | ORAL | Status: DC | PRN

## 2012-02-13 MED ORDER — ONDANSETRON HCL 4 MG/2ML IJ SOLN
4.0000 mg | Freq: Once | INTRAMUSCULAR | Status: AC
  Administered 2012-02-13: 4 mg via INTRAVENOUS
  Filled 2012-02-13: qty 2

## 2012-02-13 MED ORDER — SODIUM CHLORIDE 0.9 % IV SOLN
3.0000 g | Freq: Once | INTRAVENOUS | Status: AC
  Administered 2012-02-13: 3 g via INTRAVENOUS
  Filled 2012-02-13: qty 3

## 2012-02-13 MED ORDER — OLMESARTAN-AMLODIPINE-HCTZ 40-5-25 MG PO TABS: 1.0000 | ORAL_TABLET | Freq: Every day | ORAL | Status: DC

## 2012-02-13 MED ORDER — METOCLOPRAMIDE HCL 5 MG/ML IJ SOLN: 10.0000 mg | Freq: Once | INTRAMUSCULAR | Status: DC | PRN

## 2012-02-13 SURGICAL SUPPLY — 35 items
BANDAGE GAUZE ELAST BULKY 4 IN (GAUZE/BANDAGES/DRESSINGS) IMPLANT
BLADE SURG 15 STRL LF DISP TIS (BLADE) ×1 IMPLANT
BLADE SURG 15 STRL SS (BLADE) ×1
CANISTER SUCTION 2500CC (MISCELLANEOUS) ×2 IMPLANT
CLEANER TIP ELECTROSURG 2X2 (MISCELLANEOUS) IMPLANT
CLOTH BEACON ORANGE TIMEOUT ST (SAFETY) ×2 IMPLANT
COVER SURGICAL LIGHT HANDLE (MISCELLANEOUS) ×2 IMPLANT
DRAPE LAPAROTOMY T 102X78X121 (DRAPES) IMPLANT
DRAPE UTILITY 15X26 W/TAPE STR (DRAPE) IMPLANT
DRSG PAD ABDOMINAL 8X10 ST (GAUZE/BANDAGES/DRESSINGS) ×2 IMPLANT
GAUZE PACKING IODOFORM 1 (PACKING) ×4 IMPLANT
GAUZE SPONGE 4X4 16PLY XRAY LF (GAUZE/BANDAGES/DRESSINGS) ×2 IMPLANT
GLOVE BIO SURGEON STRL SZ7.5 (GLOVE) ×2 IMPLANT
GLOVE BIOGEL PI IND STRL 6.5 (GLOVE) ×1 IMPLANT
GLOVE BIOGEL PI INDICATOR 6.5 (GLOVE) ×1
GLOVE SURG ORTHO 8.0 STRL STRW (GLOVE) ×2 IMPLANT
GOWN STRL NON-REIN LRG LVL3 (GOWN DISPOSABLE) ×2 IMPLANT
GOWN STRL REIN XL XLG (GOWN DISPOSABLE) ×2 IMPLANT
KIT BASIN OR (CUSTOM PROCEDURE TRAY) ×2 IMPLANT
KIT ROOM TURNOVER OR (KITS) ×2 IMPLANT
NS IRRIG 1000ML POUR BTL (IV SOLUTION) ×2 IMPLANT
PACK LITHOTOMY IV (CUSTOM PROCEDURE TRAY) ×2 IMPLANT
PAD ARMBOARD 7.5X6 YLW CONV (MISCELLANEOUS) ×4 IMPLANT
PENCIL BUTTON HOLSTER BLD 10FT (ELECTRODE) IMPLANT
SPONGE GAUZE 4X4 12PLY (GAUZE/BANDAGES/DRESSINGS) ×2 IMPLANT
SPONGE LAP 18X18 X RAY DECT (DISPOSABLE) IMPLANT
SWAB COLLECTION DEVICE MRSA (MISCELLANEOUS) ×2 IMPLANT
TAPE CLOTH SURG 6X10 WHT LF (GAUZE/BANDAGES/DRESSINGS) ×2 IMPLANT
TOWEL OR 17X24 6PK STRL BLUE (TOWEL DISPOSABLE) IMPLANT
TOWEL OR 17X26 10 PK STRL BLUE (TOWEL DISPOSABLE) ×2 IMPLANT
TUBE ANAEROBIC SPECIMEN COL (MISCELLANEOUS) ×2 IMPLANT
TUBE CONNECTING 12X1/4 (SUCTIONS) ×2 IMPLANT
UNDERPAD 30X30 INCONTINENT (UNDERPADS AND DIAPERS) ×2 IMPLANT
WATER STERILE IRR 1000ML POUR (IV SOLUTION) ×2 IMPLANT
YANKAUER SUCT BULB TIP NO VENT (SUCTIONS) ×2 IMPLANT

## 2012-02-13 NOTE — Anesthesia Preprocedure Evaluation (Addendum)
Anesthesia Evaluation  Patient identified by MRN, date of birth, ID band Patient awake    Reviewed: Allergy & Precautions, H&P , NPO status , Patient's Chart, lab work & pertinent test results, reviewed documented beta blocker date and time   History of Anesthesia Complications (+) PROLONGED EMERGENCE  Airway Mallampati: III TM Distance: >3 FB Neck ROM: full    Dental  (+) Teeth Intact, Missing, Dental Advisory Given and Chipped   Pulmonary sleep apnea ,  breath sounds clear to auscultation        Cardiovascular hypertension, Rhythm:regular     Neuro/Psych negative neurological ROS  negative psych ROS   GI/Hepatic negative GI ROS, Neg liver ROS,   Endo/Other  negative endocrine ROS  Renal/GU negative Renal ROS  negative genitourinary   Musculoskeletal   Abdominal   Peds  Hematology negative hematology ROS (+)   Anesthesia Other Findings See surgeon's H&P   Reproductive/Obstetrics negative OB ROS                         Anesthesia Physical Anesthesia Plan  ASA: III and emergent  Anesthesia Plan: General   Post-op Pain Management:    Induction: Intravenous  Airway Management Planned: Oral ETT and Video Laryngoscope Planned  Additional Equipment:   Intra-op Plan:   Post-operative Plan: Extubation in OR  Informed Consent: I have reviewed the patients History and Physical, chart, labs and discussed the procedure including the risks, benefits and alternatives for the proposed anesthesia with the patient or authorized representative who has indicated his/her understanding and acceptance.   Dental Advisory Given  Plan Discussed with: CRNA and Surgeon  Anesthesia Plan Comments:         Anesthesia Quick Evaluation

## 2012-02-13 NOTE — Anesthesia Procedure Notes (Signed)
Procedure Name: Intubation Date/Time: 02/13/2012 4:26 PM Performed by: Jefm Miles E Pre-anesthesia Checklist: Patient identified, Timeout performed, Emergency Drugs available, Suction available and Patient being monitored Patient Re-evaluated:Patient Re-evaluated prior to inductionOxygen Delivery Method: Circle system utilized Preoxygenation: Pre-oxygenation with 100% oxygen Intubation Type: IV induction, Rapid sequence and Cricoid Pressure applied Laryngoscope Size: Mac Grade View: Grade I Tube type: Oral Tube size: 7.5 mm Number of attempts: 1 Airway Equipment and Method: Stylet and Video-laryngoscopy Placement Confirmation: ETT inserted through vocal cords under direct vision,  breath sounds checked- equal and bilateral and positive ETCO2 Secured at: 23 cm Tube secured with: Tape Dental Injury: Teeth and Oropharynx as per pre-operative assessment

## 2012-02-13 NOTE — ED Provider Notes (Signed)
History     CSN: [id number]  Arrival date & time 02/13/12  1130   First MD Initiated Contact with Patient 02/13/12 1201      Chief Complaint  Patient presents with  . Abdominal Pain    (Consider location/radiation/quality/duration/timing/severity/associated sxs/prior treatment) HPI Comments: Patient reports that he has been having rectal pain for about a week. He was seen by his primary doctor initially treated for hemorrhoids. Patient reports that he was seen at [facility] 2 days ago and had a CAT scan. At that time he was noted that he had abscess and he was started on antibiotics. Patient reports that his pain and swelling has progressively worsened. He now has severe pain and cannot sit down.  Patient is a 57 y.o. male presenting with abdominal pain.  Abdominal Pain The primary symptoms of the illness do not include fever.    History reviewed. No pertinent past medical history.  Past Surgical History  Procedure Date  . Back surgery   . Shoulder arthroscopy with rotator cuff repair     LEFT  . Spine surgery     screws  - due to injury - ruptured disc  . Cervical disc surgery   . Hydrocele excision     History reviewed. No pertinent family history.  History  Substance Use Topics  . Smoking status: Former Games developer  . Smokeless tobacco: Not on file  . Alcohol Use: Yes     Comment: occassional      Review of Systems  Constitutional: Negative for fever.  Gastrointestinal: Positive for blood in stool and rectal pain.  Skin:       Pain and swelling right buttock  All other systems reviewed and are negative.    Allergies  Hydrocodeine  Home Medications   Current Outpatient Rx  Name  Route  Sig  Dispense  Refill  . ASPIRIN 81 MG PO TABS   Oral   Take 81 mg by mouth daily.         . CHOLECALCIFEROL 400 UNITS PO TABS   Oral   Take 2,000 Units by mouth.         Marland Kitchen CIPROFLOXACIN HCL 750 MG PO TABS   Oral   Take 1 tablet (750 mg total) by mouth 2  (two) times daily.   20 tablet   0   . HYDROCODONE-ACETAMINOPHEN 5-325 MG PO TABS   Oral   Take 1 tablet by mouth every 4 (four) hours as needed for pain.   15 tablet   0   . HYDROCORTISONE ACETATE 25 MG RE SUPP   Rectal   Place 1 suppository (25 mg total) rectally 2 (two) times daily.   12 suppository   0   . MELOXICAM 15 MG PO TABS   Oral   Take 15 mg by mouth daily.         Marland Kitchen METRONIDAZOLE 500 MG PO TABS   Oral   Take 1 tablet (500 mg total) by mouth 4 (four) times daily.   50 tablet   0   . OLMESARTAN-AMLODIPINE-HCTZ 40-5-25 MG PO TABS   Oral   Take 1 tablet by mouth daily.         Marland Kitchen OMEPRAZOLE 40 MG PO CPDR   Oral   Take 40 mg by mouth daily.         Marland Kitchen POLYETHYLENE GLYCOL 3350 PO POWD   Oral   Take 17 g by mouth daily.   255 g   0   .  PRAMOXINE HCL 1 % RE FOAM   Rectal   Place rectally every 2 (two) hours as needed for hemorrhoids.   15 g   0   . PROMETHAZINE HCL 25 MG PO TABS   Oral   Take 25 mg by mouth every 6 (six) hours as needed. For nausea         . SIMVASTATIN 20 MG PO TABS   Oral   Take 20 mg by mouth every evening.           There were no vitals taken for this visit.  Physical Exam  Constitutional: He is oriented to person, place, and time. He appears well-developed and well-nourished. No distress.  HENT:  Head: Normocephalic and atraumatic.  Right Ear: Hearing normal.  Nose: Nose normal.  Mouth/Throat: Oropharynx is clear and moist and mucous membranes are normal.  Eyes: Conjunctivae normal and EOM are normal. Pupils are equal, round, and reactive to light.  Neck: Normal range of motion. Neck supple.  Cardiovascular: Normal rate, regular rhythm, S1 normal and S2 normal.  Exam reveals no gallop and no friction rub.   No murmur heard. Pulmonary/Chest: Effort normal and breath sounds normal. No respiratory distress. He exhibits no tenderness.  Abdominal: Soft. Normal appearance and bowel sounds are normal. There is no  hepatosplenomegaly. There is no tenderness. There is no rebound, no guarding, no tenderness at McBurney's point and negative Murphy's sign. No hernia.  Genitourinary: Rectal exam shows tenderness.          Patient declined rectal exam. External examination, her, reveals significant erythema, swelling, tenderness and induration of much of the right buttock  Musculoskeletal: Normal range of motion.  Neurological: He is alert and oriented to person, place, and time. He has normal strength. No cranial nerve deficit or sensory deficit. Coordination normal. GCS eye subscore is 4. GCS verbal subscore is 5. GCS motor subscore is 6.  Skin: Skin is warm, dry and intact. No rash noted. There is erythema. No cyanosis.     Psychiatric: He has a normal mood and affect. His speech is normal and behavior is normal. Thought content normal.    ED Course  Procedures (including critical care time)  Labs Reviewed - No data to display No results found.   No diagnosis found.    MDM  Patient presents to ER for progression of right-sided buttock and rectal pain over a period of the week. He was initially treated for hemorrhoids without improvement. He then was seen in the patent had a CAT scan. The CAT scan showed possible early diverticulitis as well as possible cellulitis with an area of possible abscess in the soft tissues of the buttock. He was treated with oral antibiotics and has worsened. Examination today shows a significant area of induration without any obvious fluctuant region for drainage. I do suspect deep abscess. Case discussed with general surgery who will evaluate the patient in the ER.        Gilda Crease, MD 02/13/12 1921

## 2012-02-13 NOTE — ED Notes (Signed)
General sx at bedside assessing pt.

## 2012-02-13 NOTE — Consult Note (Signed)
Reason for Consult: Rectal/buttock pain Referring Physician: Dr. Blinda Leatherwood (ED physician)  Evan Smith is an 57 y.o. male.  HPI: 57 y/o male reports that he has been having rectal pain for about a week. He was seen by his primary doctor multiple times and treated for hemorrhoids over the last 5 years. He was again seen by his PCP who suspected a peri-rectal abscess and started him on oral antibiotics.  Patient reports that he was seen at [facility] 2 days ago and had a CT scan.  He saw general surgeon Dr. Malvin Johns who I&D'ed an area and he was started on antibiotics. The CT scan showed subtle soft tissue asymmetry about the right perianal region consistent with cellulitis or abscess.    Patient returns to [facility] and states that his pain and swelling has progressively worsened despite being on cipro and flagyl. He now has severe pain and cannot sit down.  Pt denies discharge from the rectum but noticed BRBPR.  He denies any fevers   History reviewed. No pertinent past medical history.  Past Surgical History  Procedure Date  . Back surgery   . Shoulder arthroscopy with rotator cuff repair     LEFT  . Spine surgery     screws  - due to injury - ruptured disc  . Cervical disc surgery   . Hydrocele excision     History reviewed. No pertinent family history.  Social History:  reports that he has quit smoking. He does not have any smokeless tobacco history on file. He reports that he drinks alcohol. His drug history not on file.  Allergies:  Allergies  Allergen Reactions  . Hydrocodeine (Dihydrocodeine) Nausea And Vomiting    Medications: reviewed  Results for orders placed during the hospital encounter of 02/13/12 (from the past 48 hour(s))  CBC WITH DIFFERENTIAL     Status: Abnormal   Collection Time   02/13/12 12:55 PM      Component Value Range Comment   WBC 18.2 (*) 4.0 - 10.5 K/uL    RBC 3.89 (*) 4.22 - 5.81 MIL/uL    Hemoglobin 12.1 (*) 13.0 - 17.0 g/dL    HCT 16.1  (*) 09.6 - 52.0 %    MCV 92.5  78.0 - 100.0 fL    MCH 31.1  26.0 - 34.0 pg    MCHC 33.6  30.0 - 36.0 g/dL    RDW 04.5  40.9 - 81.1 %    Platelets 248  150 - 400 K/uL    Neutrophils Relative 86 (*) 43 - 77 %    Neutro Abs 15.6 (*) 1.7 - 7.7 K/uL    Lymphocytes Relative 6 (*) 12 - 46 %    Lymphs Abs 1.0  0.7 - 4.0 K/uL    Monocytes Relative 9  3 - 12 %    Monocytes Absolute 1.6 (*) 0.1 - 1.0 K/uL    Eosinophils Relative 0  0 - 5 %    Eosinophils Absolute 0.0  0.0 - 0.7 K/uL    Basophils Relative 0  0 - 1 %    Basophils Absolute 0.0  0.0 - 0.1 K/uL   BASIC METABOLIC PANEL     Status: Abnormal   Collection Time   02/13/12 12:55 PM      Component Value Range Comment   Sodium 134 (*) 135 - 145 mEq/L    Potassium 4.1  3.5 - 5.1 mEq/L    Chloride 95 (*) 96 - 112 mEq/L  CO2 26  19 - 32 mEq/L    Glucose, Bld 110 (*) 70 - 99 mg/dL    BUN 28 (*) 6 - 23 mg/dL    Creatinine, Ser 4.54 (*) 0.50 - 1.35 mg/dL    Calcium 9.4  8.4 - 09.8 mg/dL    GFR calc non Af Amer 37 (*) >90 mL/min    GFR calc Af Amer 42 (*) >90 mL/min     No results found.  Review of Systems  Constitutional: Positive for chills, malaise/fatigue and diaphoresis. Negative for fever.  Respiratory: Negative for shortness of breath and wheezing.   Cardiovascular: Negative for chest pain.  Gastrointestinal: Positive for nausea, diarrhea and blood in stool (blood tinged toilet paper). Negative for heartburn, vomiting, abdominal pain and constipation.       Rectal/perirectal pain, swelling, redness  Genitourinary: Negative for dysuria.  Skin: Negative for rash.   Blood pressure 139/57, pulse 107, temperature 99.8 F (37.7 C), temperature source Oral, resp. rate 18, SpO2 95.00%. Physical Exam  Constitutional: He is oriented to person, place, and time. He appears well-developed and well-nourished.  HENT:  Head: Normocephalic and atraumatic.  Eyes: Conjunctivae normal are normal. Pupils are equal, round, and reactive to light.  Right eye exhibits no discharge.  Neck: Normal range of motion.  Cardiovascular: Normal rate and regular rhythm.  Exam reveals no gallop and no friction rub.   No murmur heard. Respiratory: Effort normal and breath sounds normal. No respiratory distress. He has no wheezes. He has no rales.  GI: Soft. Bowel sounds are normal. He exhibits no distension. There is no tenderness. There is no rebound and no guarding.  Genitourinary:       Large area of rectal/perirectal pain on right buttock, +induration, very firm, no evident fluctuance or purulent drainage, +BRBPR  Neurological: He is alert and oriented to person, place, and time.  Skin: Skin is warm and dry.  Psychiatric: He has a normal mood and affect. His behavior is normal.    Assessment/Plan: 57 y/o male with likely perirectal buttock abscess 1.  Admit to [facility] for I&D of perirectal/buttock abscess today 2.  IVF, Antibiotics, pain control   DORT, Sloane Palmer 02/13/2012, 3:20 PM

## 2012-02-13 NOTE — ED Notes (Signed)
Pt reports he was recently dx with abcess on his intestines, and had been given antibiotics. States he was seen by Dr. Malvin Johns and was initially dx there. Bright red blood noted in his stools. Denies any n/v. Feels like the pain is getting worse and is unable to sit down.

## 2012-02-13 NOTE — Transfer of Care (Signed)
Immediate Anesthesia Transfer of Care Note  Patient: Evan Smith  Procedure(s) Performed: Procedure(s) (LRB) with comments: IRRIGATION AND DEBRIDEMENT PERIRECTAL ABSCESS (N/A)  Patient Location: PACU  Anesthesia Type:General  Level of Consciousness: awake, alert  and oriented  Airway & Oxygen Therapy: Patient Spontanous Breathing and Patient connected to nasal cannula oxygen  Post-op Assessment: Report given to PACU RN  Post vital signs: Reviewed and stable  Complications: No apparent anesthesia complications

## 2012-02-13 NOTE — ED Notes (Signed)
Pt c/o pain in buttocks.

## 2012-02-13 NOTE — Brief Op Note (Signed)
02/13/2012  4:59 PM  PATIENT:  [name]  57 y.o. male  PRE-OPERATIVE DIAGNOSIS:  Peri-rectal Abscess  POST-OPERATIVE DIAGNOSIS:  same  PROCEDURE:  Incision, drainage, and open packing of right sided perirectal abscess  SURGEON:  Surgeon(s) and Role:    * Velora Heckler, MD - Primary  ANESTHESIA:   general  EBL:     BLOOD ADMINISTERED:none  DRAINS: 1 inch iodoform gauze packing strip   LOCAL MEDICATIONS USED:  NONE  SPECIMEN:  Source of Specimen:  abscess, aerobic and anaerobic cultures to lab  DISPOSITION OF SPECIMEN:  lab  COUNTS:  YES  TOURNIQUET:  * No tourniquets in log *  DICTATION: .Other Dictation: Dictation Number [id number]  PLAN OF CARE: Admit for overnight observation  PATIENT DISPOSITION:  PACU - hemodynamically stable.   Delay start of Pharmacological VTE agent (>24hrs) due to surgical blood loss or risk of bleeding: yes  Velora Heckler, MD, [facility] Office: [phone]

## 2012-02-13 NOTE — Anesthesia Postprocedure Evaluation (Signed)
Anesthesia Post Note  Patient: Evan Smith  Procedure(s) Performed: Procedure(s) (LRB): IRRIGATION AND DEBRIDEMENT PERIRECTAL ABSCESS (N/A)  Anesthesia type: general  Patient location: PACU  Post pain: Pain level controlled  Post assessment: Patient's Cardiovascular Status Stable  Last Vitals:  Filed Vitals:   02/13/12 1741  BP:   Pulse: 112  Temp:   Resp: 21    Post vital signs: Reviewed and stable  Level of consciousness: sedated  Complications: No apparent anesthesia complications

## 2012-02-13 NOTE — Consult Note (Signed)
General Surgery [facility]  Patient seen and examined in ER.  Patient has a large perirectal abscess extending into the right buttock on physical exam.  Will proceed to OR urgently for I&D.  Patient and wife understand and agree to proceed.  The risks and benefits of the procedure have been discussed at length with the patient.  The patient understands the proposed procedure, potential alternative treatments, and the course of recovery to be expected.  All of the patient's questions have been answered at this time.  The patient wishes to proceed with surgery.  Velora Heckler, MD, [facility] Office: [phone]

## 2012-02-13 NOTE — ED Notes (Signed)
Unable to get 2nd set of blood culture. Dr. Blinda Leatherwood aware.

## 2012-02-13 NOTE — Preoperative (Signed)
Beta Blockers   Reason not to administer Beta Blockers:Not Applicable 

## 2012-02-13 NOTE — ED Notes (Signed)
Pt c/o pain in rectum and right buttocks. Pt reports he feels like the right side of buttocks is more swollen and hard than left. Pt reports he has been dx with an abscess in his intestine & has noticed some bright red spotting when he wipes. Pt reports this has been going on for years and he always thought it was hemorrhoids but just found out that it is an abscess. Pt was given antibiotics for abscess by PCP.

## 2012-02-13 NOTE — ED Notes (Signed)
OR called, ready for pt

## 2012-02-13 NOTE — ED Notes (Signed)
Blood culture collected

## 2012-02-14 LAB — CBC
HCT: 33.4 % — ABNORMAL LOW (ref 39.0–52.0)
MCH: 30.9 pg (ref 26.0–34.0)
MCV: 93 fL (ref 78.0–100.0)
RDW: 13.7 % (ref 11.5–15.5)
WBC: 16 10*3/uL — ABNORMAL HIGH (ref 4.0–10.5)

## 2012-02-14 MED ORDER — CLONAZEPAM 0.5 MG PO TABS
0.5000 mg | ORAL_TABLET | Freq: Three times a day (TID) | ORAL | Status: DC | PRN
  Administered 2012-02-14: 0.5 mg via ORAL
  Filled 2012-02-14: qty 1

## 2012-02-14 MED ORDER — CHLORPROMAZINE HCL 25 MG PO TABS
25.0000 mg | ORAL_TABLET | Freq: Four times a day (QID) | ORAL | Status: DC | PRN
  Filled 2012-02-14 (×2): qty 1

## 2012-02-14 MED ORDER — AMOXICILLIN-POT CLAVULANATE 875-125 MG PO TABS: 1.0000 | ORAL_TABLET | Freq: Two times a day (BID) | ORAL | Status: DC

## 2012-02-14 MED ORDER — HYDROMORPHONE HCL PF 1 MG/ML IJ SOLN: 2.0000 mg | Freq: Once | INTRAMUSCULAR | Status: AC

## 2012-02-14 MED ORDER — AMOXICILLIN-POT CLAVULANATE 875-125 MG PO TABS
1.0000 | ORAL_TABLET | Freq: Two times a day (BID) | ORAL | Status: DC
  Administered 2012-02-14 – 2012-02-16 (×4): 1 via ORAL
  Filled 2012-02-14 (×6): qty 1

## 2012-02-14 MED ORDER — HYDROCODONE-ACETAMINOPHEN 5-325 MG PO TABS
0.5000 | ORAL_TABLET | ORAL | Status: DC | PRN
  Administered 2012-02-14 – 2012-02-15 (×3): 2 via ORAL
  Filled 2012-02-14 (×3): qty 2

## 2012-02-14 MED ORDER — HYDROCODONE-ACETAMINOPHEN 5-325 MG PO TABS: 0.5000 | ORAL_TABLET | ORAL | Status: DC | PRN

## 2012-02-14 NOTE — Care Management Note (Signed)
  Page 1 of 1   02/14/2012     2:23:48 PM   CARE MANAGEMENT NOTE 02/14/2012  Patient:  Evan Smith, Evan Smith   Account Number:  [id number]  Date Initiated:  02/14/2012  Documentation initiated by:  Ronny Flurry  Subjective/Objective Assessment:     Action/Plan:   Anticipated DC Date:     Anticipated DC Plan:  HOME W HOME HEALTH SERVICES         Choice offered to / List presented to:          [facility] arranged  HH-1 RN      [facility] agency  [facility]   Status of service:  In process, will continue to follow Medicare Important Message given?   (If response is "NO", the following Medicare IM given date fields will be blank) Date Medicare IM given:   Date Additional Medicare IM given:    Discharge Disposition:  HOME W HOME HEALTH SERVICES  Per UR Regulation:  Reviewed for med. necessity/level of care/duration of stay  If discussed at Long Length of Stay Meetings, dates discussed:    Comments:  02-14-12 Facesheet information correct. Ronny Flurry RN BSN [phone]

## 2012-02-14 NOTE — Progress Notes (Signed)
2350 Patient up to bathroom to have bm. Dressing to buttocks removed due to stool on dressing. Outer dressing replaced packing intact.

## 2012-02-14 NOTE — Discharge Summary (Signed)
Physician Discharge Summary  Patient ID: Evan Smith MRN: [id number] DOB/AGE: [birth year] 57 y.o.  Admit date: 02/13/2012 Discharge date: 02/16/2012  Admitting Diagnosis: Perirectal Abscess and Cellulitis Buttock/rectal pain Leukocytosis  Discharge Diagnosis Patient Active Problem List   Diagnosis Date Noted  . Hypertension 02/13/2012  . Hyperlipidemia 02/13/2012  . Sleep apnea 02/13/2012  . Perirectal cellulitis 02/13/2012  . Perirectal abscess 02/13/2012    Consultants None  Procedures Dr Gerrit Friends (02/13/12):  Incision, drainage, and open packing of perirectal abscess.  Hospital Course:  57 y/o male reports that he has been having rectal pain for about a week. He was seen by his primary doctor multiple times and treated for hemorrhoids over the last 5 years. He was again seen by his PCP who suspected a peri-rectal abscess and started him on oral antibiotics. Patient reports that he was seen at [facility] 2 days ago and had a CT scan. He saw general surgeon Dr. Malvin Johns who I&D'ed an area and he was started on antibiotics. The CT scan showed subtle soft tissue asymmetry about the right perianal region consistent with cellulitis or abscess.   Patient returns to [facility] and states that his pain and swelling has progressively worsened despite being on cipro and flagyl. He now has severe pain and cannot sit down. Pt denies discharge from the rectum but noticed BRBPR. He denies any fevers.  Workup showed a wide area of cellulitis and evidence of perirectal abscess.  Patient was admitted and underwent procedure listed above.  Tolerated procedure well and was transferred to the floor.  He experienced constant diarrhea on POD #1.  It was suspected that he arrived to the ED with a viral GI illness.  Diet was advanced as tolerated.  On POD#3, the patient was voiding well, tolerating diet, ambulating well, pain well controlled, vital signs stable, packing in place and felt stable for  discharge home.  Patient will follow up in our office in 2 weeks and knows to call with questions or concerns.      Medication List     As of 02/16/2012  9:35 AM    STOP taking these medications         ciprofloxacin 750 MG tablet   Commonly known as: CIPRO      HYDROcodone-acetaminophen 5-325 MG per tablet   Commonly known as: NORCO/VICODIN      hydrocortisone 25 MG suppository   Commonly known as: ANUSOL-HC      metroNIDAZOLE 500 MG tablet   Commonly known as: FLAGYL      pramoxine 1 % foam   Commonly known as: PROCTOFOAM      promethazine 25 MG tablet   Commonly known as: PHENERGAN      TAKE these medications         amoxicillin-clavulanate 875-125 MG per tablet   Commonly known as: AUGMENTIN   Take 1 tablet by mouth every 12 (twelve) hours.      aspirin 81 MG tablet   Take 81 mg by mouth daily.      cholecalciferol 400 UNITS Tabs   Commonly known as: VITAMIN D   Take 2,000 Units by mouth.      loperamide 2 MG capsule   Commonly known as: IMODIUM   Take 1 capsule (2 mg total) by mouth as needed for diarrhea or loose stools.      meloxicam 15 MG tablet   Commonly known as: MOBIC   Take 15 mg by mouth daily.  omeprazole 40 MG capsule   Commonly known as: PRILOSEC   Take 40 mg by mouth daily.      oxyCODONE-acetaminophen 7.5-325 MG per tablet   Commonly known as: PERCOCET   Take 1-2 tablets by mouth every 4 (four) hours as needed for pain.      polyethylene glycol powder powder   Commonly known as: GLYCOLAX/MIRALAX   Take 17 g by mouth daily.      simvastatin 20 MG tablet   Commonly known as: ZOCOR   Take 20 mg by mouth every evening.      TRIBENZOR 40-5-25 MG Tabs   Generic drug: Olmesartan-Amlodipine-HCTZ   Take 1 tablet by mouth daily.              Follow-up Information    Follow up with [facility]. Schedule an appointment as soon as possible for a visit in 2 weeks.   Contact information:   [address]   [place] [address] [phone]          Signed: Aris Georgia, [facility] [phone]  Freeman Caldron, PA-C Pager: [phone]    02/14/2012, 1:44 PM

## 2012-02-14 NOTE — OR Nursing (Signed)
02-14-12 late entry to surgical dressing.

## 2012-02-14 NOTE — Progress Notes (Signed)
[facility]  Patient Status: New  [facility] is providing the following services: RN  If patient discharges after hours, please call [phone].   Wynelle Bourgeois 02/14/2012, 3:17 PM

## 2012-02-14 NOTE — Progress Notes (Signed)
General Surgery [facility]  Patient improved post op.  Less pain.  WBC down to 16K.  Diarrhea present pre-op.  Local wound care.  Continue abx po.  Velora Heckler, MD, [facility] Office: [phone]

## 2012-02-14 NOTE — Op Note (Signed)
Evan Smith, Evan Smith NO.:  [id number]  MEDICAL RECORD NO.:  [id number]  LOCATION:  6N12C                        FACILITY:  [facility]  PHYSICIAN:  Velora Heckler, MD      DATE OF BIRTH:  April 01, 1955  DATE OF PROCEDURE:  02/13/2012                               OPERATIVE REPORT   PREOPERATIVE DIAGNOSIS:  Perirectal abscess.  POSTOPERATIVE DIAGNOSIS:  Perirectal abscess.  PROCEDURE:  Incision, drainage, and open packing of perirectal abscess.  SURGEON:  Velora Heckler, MD, FACS  ANESTHESIA:  General per Dr. Hart Robinsons.  ESTIMATED BLOOD LOSS:  Minimal.  PREPARATION:  Betadine.  COMPLICATIONS:  None.  INDICATIONS:  The patient is a 57 year old black male, who presented to the emergency department with a 1-1/2 week history of perianal pain. The patient had been evaluated on 3 occasions in the last 3 days at [facility] Emergency Department.  He had been referred to Dr. Malvin Johns in [place] for evaluation.  The patient became frustrated and came to [facility] Emergency Department today for evaluation.  The patient was seen by the emergency room physician.  His white blood cell count was elevated 18,000.  General Surgery was consulted and a diagnosis of perirectal abscess was made.  The patient was prepared urgently and brought to the operating room for incision and drainage.  BODY OF REPORT:  Procedures done in OR #1 at the [facility].  The patient was brought to the operating room, placed in supine position on the operating room table.  Following administration of general anesthesia, the patient was placed in lithotomy and prepped and draped in the usual aseptic fashion.  After ascertaining that an adequate level of anesthesia had been achieved, the abscess in the medial right buttock was incised with a #15 blade.  Yellowish golden purulent fluid is evacuated from the abscess cavity.  This may contain some fecal  material.  An ellipse of skin measuring 2 cm x 1 cm was excised with the electrocautery.  Cavity is manually explored and loculations were broken up.  Cavity was then irrigated with a Betadine solution extensively. Cavity was then packed with 1 inch iodoform gauze packing and covered with dry gauze dressings and an ABD pad.  The patient tolerated the procedure well.  He was taken out of lithotomy and brought to the recovery room in stable condition.  Velora Heckler, MD, [facility] Office: [phone]    TMG/MEDQ  D:  02/13/2012  T:  02/14/2012  Job:  [id number]

## 2012-02-14 NOTE — Progress Notes (Signed)
0630 Patient has had dressing to buttocks changed x4 due to going to [facility]. Patient had two small bowel movements area clean well with NS and dry dressing applied each time. Packing still intact.

## 2012-02-14 NOTE — Progress Notes (Signed)
1 Day Post-Op  Subjective: Pt's pain much improved, well controlled with pain meds.  Pt having frequent BM's and having to have wound dressings replaced.  Pt ambulating without assistance.  Pt tolerating diet.  Objective: Vital signs in last 24 hours: Temp:  [98 F (36.7 C)-101.4 F (38.6 C)] 98.7 F (37.1 C) (01/03 0520) Pulse Rate:  [98-117] 98  (01/03 0520) Resp:  [16-21] 18  (01/03 0520) BP: (109-139)/(51-81) 122/68 mmHg (01/03 0520) SpO2:  [94 %-100 %] 97 % (01/03 0520) Last BM Date: 02/13/12  Intake/Output from previous day: 01/02 0701 - 01/03 0700 In: 2255 [P.O.:360; I.V.:1595; IV Piggyback:300] Out: -   PE: Gen:  Alert, NAD, pleasant Perirectal:  Packing intact, dry; will change packing with RN at next wound check  Lab Results:   Basename 02/13/12 1255  WBC 18.2*  HGB 12.1*  HCT 36.0*  PLT 248   BMET  Basename 02/13/12 1255  NA 134*  K 4.1  CL 95*  CO2 26  GLUCOSE 110*  BUN 28*  CREATININE 1.94*  CALCIUM 9.4   PT/INR No results found for this basename: LABPROT:2,INR:2 in the last 72 hours CMP     Component Value Date/Time   NA 134* 02/13/2012 1255   K 4.1 02/13/2012 1255   CL 95* 02/13/2012 1255   CO2 26 02/13/2012 1255   GLUCOSE 110* 02/13/2012 1255   BUN 28* 02/13/2012 1255   CREATININE 1.94* 02/13/2012 1255   CALCIUM 9.4 02/13/2012 1255   GFRNONAA 37* 02/13/2012 1255   GFRAA 42* 02/13/2012 1255   Lipase  No results found for this basename: lipase       Studies/Results: No results found.  Anti-infectives: Anti-infectives     Start     Dose/Rate Route Frequency Ordered Stop   02/13/12 1800   Ampicillin-Sulbactam (UNASYN) 3 g in sodium chloride 0.9 % 100 mL IVPB        3 g 100 mL/hr over 60 Minutes Intravenous Every 6 hours 02/13/12 1719     02/13/12 1545   Ampicillin-Sulbactam (UNASYN) 3 g in sodium chloride 0.9 % 100 mL IVPB        3 g 100 mL/hr over 60 Minutes Intravenous  Once 02/13/12 1530 02/13/12 1634           Assessment/Plan 57 y/o  male with perirectal buttock abscess/cellulitis, s/p surgical I&D 1.  Wound care,  will switch to W/D dresings and sitz baths 2.  Cont. IV antibiotics, will switch to oral antibiotics upon discharge (Augmentin) 3.  Pain control, ambulation 4.  Reg diet 5.  Pt will need to follow up in the clinic in 1-2 weeks for recheck  Diarrhea/abdominal cramping 1.  Likely GI illness from outpatient, pt was apparently stooling in the OR just after arriving 2.  Try rectal tube, sitz baths/shower after every BM 3.  Switch ABX to orals 4.  Will hold off on packing patient until diarrhea lessens    LOS: 1 day    DORT, Conway Fedora 02/14/2012, 8:19 AM Pager: [phone]

## 2012-02-15 LAB — CBC
Hemoglobin: 10.4 g/dL — ABNORMAL LOW (ref 13.0–17.0)
MCH: 30.8 pg (ref 26.0–34.0)
MCHC: 32.9 g/dL (ref 30.0–36.0)
RDW: 13.8 % (ref 11.5–15.5)

## 2012-02-15 MED ORDER — LOPERAMIDE HCL 2 MG PO CAPS: 2.0000 mg | ORAL_CAPSULE | ORAL | Status: DC | PRN

## 2012-02-15 MED ORDER — OXYCODONE HCL 5 MG PO TABS
5.0000 mg | ORAL_TABLET | ORAL | Status: DC | PRN
  Administered 2012-02-15 – 2012-02-16 (×5): 15 mg via ORAL
  Filled 2012-02-15 (×5): qty 3

## 2012-02-15 MED ORDER — HYDROMORPHONE HCL PF 1 MG/ML IJ SOLN: 1.0000 mg | INTRAMUSCULAR | Status: DC | PRN

## 2012-02-15 MED ORDER — LOPERAMIDE HCL 2 MG PO CAPS: 4.0000 mg | ORAL_CAPSULE | Freq: Once | ORAL | Status: DC

## 2012-02-15 NOTE — Progress Notes (Signed)
General Surgery [facility]  Patient complains of pain.  Clinically much improved.  Agree with removing rectal tube, Rx for diarrhea, sitz baths, home later today or tomorrow.  Velora Heckler, MD, [facility] Office: [phone]

## 2012-02-15 NOTE — Progress Notes (Signed)
Lost IV access, pt did not want restart at this time because he said IV team had a difficult time getting in the IV.

## 2012-02-15 NOTE — Progress Notes (Signed)
Patient ID: Evan Smith, male   DOB: Jun 18, 1955, 57 y.o.   MRN: [id number]   LOS: 2 days   Subjective: Oral pain medicine not very effective. Had no drainage from rectal tube since insertion until just now.  Objective: Vital signs in last 24 hours: Temp:  [97.9 F (36.6 C)-98.9 F (37.2 C)] 97.9 F (36.6 C) (01/04 0527) Pulse Rate:  [96-97] 97  (01/04 0527) Resp:  [16-18] 18  (01/04 0527) BP: (107-122)/(56-68) 122/56 mmHg (01/04 0527) SpO2:  [95 %-100 %] 100 % (01/04 0527) Last BM Date: 02/14/12  Lab Results:  CBC  Basename 02/15/12 0625 02/14/12 1240  WBC 9.2 16.0*  HGB 10.4* 11.1*  HCT 31.6* 33.4*  PLT 241 215    General appearance: alert and mild distress Incision/Wound: Moderate TTP, no worsening of his cellulitis   Assessment/Plan: Perirectal abscess s/p I&D -- Continue sitz baths tid, increase Norco dose. Enteritis -- Will d/c rectal tube, give loperamide Dispo -- Home if pain better controlled, diarrhea    Freeman Caldron, PA-C Pager: [phone] General Trauma PA Pager: [phone]   02/15/2012

## 2012-02-16 LAB — CULTURE, ROUTINE-ABSCESS

## 2012-02-16 MED ORDER — LOPERAMIDE HCL 2 MG PO CAPS: 2.0000 mg | ORAL_CAPSULE | ORAL | Status: DC | PRN

## 2012-02-16 MED ORDER — OXYCODONE-ACETAMINOPHEN 7.5-325 MG PO TABS: 1.0000 | ORAL_TABLET | ORAL | Status: DC | PRN

## 2012-02-16 NOTE — Progress Notes (Signed)
Patient ID: Evan Smith, male   DOB: 1955-04-21, 57 y.o.   MRN: [id number]   LOS: 3 days   Subjective: No new c/o. Diarrhea has slowed but not stopped. Pain better controlled.  Objective: Vital signs in last 24 hours: Temp:  [97.9 F (36.6 C)-98.5 F (36.9 C)] 97.9 F (36.6 C) (01/05 0501) Pulse Rate:  [92-108] 99  (01/05 0501) Resp:  [18-20] 18  (01/05 0501) BP: (113-127)/(68-93) 127/68 mmHg (01/05 0501) SpO2:  [96 %-100 %] 97 % (01/05 0501) Last BM Date: 02/15/12   GI: Wound unchanged. A few ml of pus were able to be expressed.   Assessment/Plan: Perirectal abscess s/p I&D -- Continue sitz baths tid Enteritis  Dispo -- Home     Freeman Caldron, PA-C Pager: [phone] General Trauma PA Pager: [phone]   02/16/2012

## 2012-02-16 NOTE — Progress Notes (Signed)
General Surgery [facility]  Agree with attached.  Continue local wound care, sitz baths.  Will follow up at [facility] office 1-2 weeks.  Velora Heckler, MD, [facility] Office: [phone]

## 2012-02-16 NOTE — Discharge Summary (Signed)
General Surgery [facility]  As noted.  Will follow up at [facility] office.  Velora Heckler, MD, [facility] Office: [phone]

## 2012-02-17 ENCOUNTER — Encounter (HOSPITAL_COMMUNITY): Payer: Self-pay | Admitting: Surgery

## 2012-02-18 LAB — ANAEROBIC CULTURE

## 2012-02-19 ENCOUNTER — Telehealth (INDEPENDENT_AMBULATORY_CARE_PROVIDER_SITE_OTHER): Payer: Self-pay

## 2012-02-19 LAB — CULTURE, BLOOD (ROUTINE X 2): Culture: NO GROWTH

## 2012-02-19 NOTE — Telephone Encounter (Signed)
Pt home doing well. F/U appt made for 03-10-12. Pt to call with any concerns.

## 2012-03-10 ENCOUNTER — Ambulatory Visit (INDEPENDENT_AMBULATORY_CARE_PROVIDER_SITE_OTHER): Payer: Medicare Other | Admitting: Surgery

## 2012-03-10 ENCOUNTER — Encounter (INDEPENDENT_AMBULATORY_CARE_PROVIDER_SITE_OTHER): Payer: Self-pay | Admitting: Surgery

## 2012-03-10 VITALS — BP 122/78 | HR 71 | Temp 97.6°F | Resp 18 | Ht 68.0 in | Wt 296.2 lb

## 2012-03-10 DIAGNOSIS — K612 Anorectal abscess: Secondary | ICD-10-CM

## 2012-03-10 DIAGNOSIS — K611 Rectal abscess: Secondary | ICD-10-CM

## 2012-03-10 NOTE — Progress Notes (Signed)
General Surgery [facility]  Visit Diagnoses: 1. Perirectal abscess     HISTORY: The patient is a 57 year old black male who underwent incision drainage and open packing of a large perirectal abscess on 02/13/2012. Postoperative course required 3 days of inpatient stay for cellulitis with intravenous antibiotics. Patient has been irrigating the wound with saline and packing with moist gauze.  EXAM: External examination shows a relatively normal anoderm. Surgical wound in the right posterior position is clean and granulating. It measures 3 cm in length by less than 1 cm in width by less than 1 cm in depth. There is no drainage. There is no bleeding. There is no induration. There is no sign of residual cellulitis.  IMPRESSION: Status post incision and drainage of large perirectal abscess with cellulitis  PLAN: Patient will cleanse the wound daily in the shower with soap and water. I've asked him to wear a pad to track the drainage. The wound is not require further irrigation and repacking at this time. Patient will return in 4 weeks for final wound check.  We did discuss the possibility of fistula formation. We will continue to monitor for this.  Velora Heckler, MD, FACS General & Endocrine Surgery [facility]

## 2012-03-10 NOTE — Patient Instructions (Signed)
Stop saline irrigation and packing of wound.  Shower once daily and cleanse with soap and water.  Wear pad to track drainage.  Velora Heckler, MD, [facility] Office: [phone]

## 2012-04-07 ENCOUNTER — Ambulatory Visit (INDEPENDENT_AMBULATORY_CARE_PROVIDER_SITE_OTHER): Payer: Medicare Other | Admitting: General Surgery

## 2012-04-07 ENCOUNTER — Encounter (INDEPENDENT_AMBULATORY_CARE_PROVIDER_SITE_OTHER): Payer: Self-pay | Admitting: General Surgery

## 2012-04-07 VITALS — BP 110/78 | HR 87 | Temp 97.6°F | Resp 18 | Ht 68.0 in | Wt 303.6 lb

## 2012-04-07 DIAGNOSIS — K612 Anorectal abscess: Secondary | ICD-10-CM

## 2012-04-07 DIAGNOSIS — K611 Rectal abscess: Secondary | ICD-10-CM

## 2012-04-07 MED ORDER — SULFAMETHOXAZOLE-TRIMETHOPRIM 800-160 MG PO TABS: 1.0000 | ORAL_TABLET | Freq: Two times a day (BID) | ORAL | Status: AC

## 2012-04-07 NOTE — Patient Instructions (Signed)
Pick up antibiotic Call if symptoms worsen or if develop Temp >101, pain with urination Take a laxative (milk of magnesia or Miralax) if you go more than 1 day without urination Drink plenty of liquids

## 2012-04-07 NOTE — Progress Notes (Signed)
Subjective:     Patient ID: Evan Smith, male   DOB: 1955-12-18, 57 y.o.   MRN: [id number]  HPI 57 year old African American male comes in complaining of increased rectal bleeding. The patient underwent incision and drainage of a large right Perirectal abscess in early January. He last saw Dr. Gerrit Friends on January 28. He states he has had continued intermittent clear drainage. He changes his pad several times a day; however, a few days ago he started having some increased bloody drainage mainly after having a bowel movement when wiping. The blood would be on the toilet paper. He denies any pain with defecation. He denies any fever or chills. He reports a good appetite. He denies any pain with urination.He states that he is having about 2 bowel movements a day. He is doing modified version of the sitz bath  Review of Systems     Objective:   Physical Exam BP 110/78  Pulse 87  Temp(Src) 97.6 F (36.4 C) (Temporal)  Resp 18  Ht 5\' 8"  (1.727 m)  Wt 303 lb 9.6 oz (137.712 kg)  BMI 46.17 kg/m2 Alert, no apparent distress Rectal-right anterior incision is about 2-1/2 cm long by half a centimeter wide, minimal depth; granulation tissue. There is a little bit of underlying induration. I do not appreciate any cellulitis or fluctuance. He is a little bit tender on digital rectal exam today but I do not appreciate any palpable or fluctuant masses    Assessment:     Status post incision and drainage of a large right perirectal abscess January 2014     Plan:     I do not appreciate any fluctuance or cellulitis. However there is a little bit of induration directly underneath the prior incision. This could just be normal healing process or early signs of recurrence. I believe the bleeding is from the incision's granulation tissue. Because this may be an early sign of recurrence, I will put him on a seven-day course of Bactrim DS. He was instructed to call for worsening symptoms. I encouraged him to drink  plenty of water and to avoid constipation. Followup with Dr. Gerrit Friends this Monday   Mary Sella. Andrey Campanile, MD, FACS General, Bariatric, & Minimally Invasive Surgery [facility], [place]

## 2012-04-13 ENCOUNTER — Encounter (INDEPENDENT_AMBULATORY_CARE_PROVIDER_SITE_OTHER): Payer: Self-pay | Admitting: Surgery

## 2012-04-13 ENCOUNTER — Ambulatory Visit (INDEPENDENT_AMBULATORY_CARE_PROVIDER_SITE_OTHER): Payer: Medicare Other | Admitting: Surgery

## 2012-04-13 VITALS — BP 138/72 | HR 103 | Temp 97.3°F | Resp 18 | Ht 68.0 in | Wt 301.8 lb

## 2012-04-13 DIAGNOSIS — K612 Anorectal abscess: Secondary | ICD-10-CM

## 2012-04-13 DIAGNOSIS — K6289 Other specified diseases of anus and rectum: Secondary | ICD-10-CM

## 2012-04-13 DIAGNOSIS — K611 Rectal abscess: Secondary | ICD-10-CM

## 2012-04-13 MED ORDER — HYDROCORTISONE ACETATE 25 MG RE SUPP: 25.0000 mg | Freq: Two times a day (BID) | RECTAL | Status: DC

## 2012-04-13 MED ORDER — HYDROCORTISONE ACE-PRAMOXINE 2.5-1 % RE CREA: TOPICAL_CREAM | RECTAL | Status: DC

## 2012-04-13 NOTE — Patient Instructions (Signed)
ANORECTAL PROCEDURES: 1.  Tub soaks 2-3 times daily in warm water (may add Epsom salts if desired) 2.  Stool softener for one month (store brand Miralax or Colace) 3.  Avoid toilet paper - use baby wipes or Tucks pads 4.  Increase water intake - 6-8 glasses daily 5.  Apply dry pad to area until drainage stops 

## 2012-04-13 NOTE — Progress Notes (Signed)
General Surgery [facility]  Visit Diagnoses: 1. Perirectal abscess   2. Anal or rectal pain     HISTORY: The patient returns for followup. He had undergone incision and drainage of perirectal abscess. Patient now complains of a "raw" feeling around the anus. He has seen bleeding per rectum with bowel movements on 2 occasions.  EXAM: External examination shows a relatively normal anoderm. The site of incision and drainage in the right anterior anal region is completely epithelialized. There is no fluctuance. There is no drainage. There is no erythema.  IMPRESSION: #1 resolved perirectal abscess #2 probable internal hemorrhoids  PLAN: Anoscopy is deferred today due to patient discomfort. I am going to treat him empirically with topical Analpram cream externally and Anusol suppositories rectally. Hopefully this will provide symptomatic relief. If the patient has persistent symptoms, we may consider referring him for colonoscopy sensitive has been 7 or 8 years since his last study. I did reassure the patient that there was no evidence on examination today of persistent perirectal abscess. I saw no evidence of fistula.  Patient will contact us if symptoms persist.  Velora Heckler, MD, FACS General & Endocrine Surgery [facility]

## 2012-04-15 ENCOUNTER — Encounter (INDEPENDENT_AMBULATORY_CARE_PROVIDER_SITE_OTHER): Payer: Medicare Other | Admitting: Surgery

## 2012-06-03 ENCOUNTER — Telehealth: Payer: Self-pay | Admitting: Nurse Practitioner

## 2012-06-04 NOTE — Telephone Encounter (Signed)
Please advise 

## 2012-06-04 NOTE — Telephone Encounter (Signed)
SAMPLES UP FRONT  

## 2012-06-04 NOTE — Telephone Encounter (Signed)
Ok for [employer]

## 2012-07-29 ENCOUNTER — Ambulatory Visit (INDEPENDENT_AMBULATORY_CARE_PROVIDER_SITE_OTHER): Payer: Medicare Other | Admitting: Surgery

## 2012-07-29 ENCOUNTER — Encounter (INDEPENDENT_AMBULATORY_CARE_PROVIDER_SITE_OTHER): Payer: Self-pay | Admitting: Surgery

## 2012-07-29 VITALS — BP 152/86 | HR 92 | Temp 98.2°F | Resp 16 | Ht 68.0 in | Wt 307.8 lb

## 2012-07-29 DIAGNOSIS — K603 Anal fistula, unspecified: Secondary | ICD-10-CM

## 2012-07-29 DIAGNOSIS — R159 Full incontinence of feces: Secondary | ICD-10-CM

## 2012-07-29 NOTE — Patient Instructions (Signed)
Anal Fistula °An anal fistula is an abnormal tunnel that develops between the bowel and skin near the outside of the anus, where feces comes out. The anus has a number of tiny glands that make lubricating fluid. Sometimes these glands can become plugged and infected. This may lead to the development of a fluid-filled pocket (abscess). An anal fistula often develops after this infection or abscess. It is nearly always caused by a past or current anal abscess.  °CAUSES  °Though an anal fistula is almost always caused by a past or current anal abscess, other causes can include: °· A complication of surgery. °· Trauma to the rectal area. °· Radiation to the area. °· Other medical conditions or diseases, such as:   °· Chronic inflammatory bowel disease, such as Crohn disease or ulcerative colitis.   °· Colon or rectal cancer.   °· Diverticular disease, such as diverticulitis.   °· A sexually transmitted disease, such as gonorrhea, chlamydia, or syphilis. °· An HIV infection or AIDS.   °SYMPTOMS  °· Throbbing or constant pain that may be worse when sitting.   °· Swelling or irritation around the anus.   °· Drainage of pus or blood from an opening near the anus.   °· Pain with bowel movements. °· Fever or chills. °DIAGNOSIS  °Your caregiver will examine the area to find the openings of the anal fistula and the fistula tract. The external opening of the anal fistula may be seen during a physical examination. Other examinations that may be performed include:  °· Examination of the rectal area with a gloved hand (digital rectal exam).   °· Examination with a probe or scope to help locate the internal opening of the fistula.   °· Injection of a dye into the fistula opening. X-rays can be taken to find the exact location and path of the fistula.   °· An MRI or ultrasound of the anal area.   °Other tests may be performed to find the cause of the anal fistula.    °TREATMENT  °The most common treatment for an anal fistula is  surgery. There are different surgery options depending on where your fistula is located and how complex the fistula is. Surgical options include: °· A fistulotomy. This surgery involves opening up the whole fistula and draining the contents inside to promote healing. °· Seton placement. A silk string (seton) is placed into the fistula during a fistulotomy to drain any infection to promote healing. °· Advancement flap procedure. Tissue is removed from your rectum or the skin around the anus and is attached to the opening of the fistula. °· Bioprosthetic plug. A cone-shaped plug is made from your tissue and is used to block the opening of the fistula. °Some anal fistulas do not require surgery. A fibrin glue is a non-surgical option that involves injecting the glue to seal the fistula. You also may be prescribed an antibiotic medicine to treat an infection.  °HOME CARE INSTRUCTIONS  °· Take your antibiotics as directed. Finish them even if you start to feel better. °· Only take over-the-counter or prescription medicines as directed by your caregiver. Use a stool softener or laxative, if recommended.   °· Eat a high-fiber diet to help avoid constipation or as directed by your caregiver. °· Drink enough water to keep your urine clear or pale yellow.   °· A warm sitz bath may be soothing and help with healing. You may take warm sitz baths for 15 20 minutes, 3 4 times a day to ease pain and discomfort.   °· Follow excellent hygiene to keep the   anal area as clean and dry as possible. Use wet toilet paper or moist towelettes after each bowel movement.   °SEEK MEDICAL CARE IF: °You have increased pain not controlled with medicines.  °SEEK IMMEDIATE MEDICAL CARE IF: °· You have severe, intolerable pain. °· You have new swelling, redness, or discharge around the anal area. °· You have tenderness or warmth around the anal area. °· You have chills or diarrhea. °· You have severe problems urinating or having a bowel movement.    °· You have a fever or persistent symptoms for more than 2 3 days.   °· You have a fever and your symptoms suddenly get worse.   °MAKE SURE YOU:  °· Understand these instructions. °· Will watch your condition. °· Will get help right away if you are not doing well or get worse. °Document Released: 01/11/2008 Document Revised: 01/15/2012 Document Reviewed: 12/03/2010 °ExitCare® Patient Information ©2014 [employer]. ° °

## 2012-07-29 NOTE — Progress Notes (Signed)
General Surgery [facility]  Visit Diagnoses: 1. Anal fistula   2. Fecal incontinence, mild     HISTORY: Patient is a 57 year old male treated for large perirectal abscess approximately 6 months ago on the general surgical service. Postoperatively the patient did well. Over the past few months however he has noted occasional bleeding with bowel movements. He has had mild discomfort with bowel movements. And he has noted passing gas through a small opening external to the anus.  Patient has had no signs or symptoms of recurrent infection. He does note mild fecal incontinence on occasion.  PERTINENT REVIEW OF SYSTEMS: Intermittent bleeding with bowel movements as noted. Passage of gas and small particulate matter external to the anus. Occasional mild fecal incontinence.  EXAM: HEENT: normocephalic; pupils equal and reactive; sclerae clear; dentition good; mucous membranes moist NECK:  symmetric on extension; no palpable anterior or posterior cervical lymphadenopathy; no supraclavicular masses; no tenderness RECTAL: External examination shows a well-healed scar in the right anterior position; with eversion there is a punctate opening measuring approximately 5 mm at the base of the scar adjacent to the anal orifice; this is probed with a blunt tipped probe for a depth of approximately 1 cm with moderate discomfort; no fluctuance; digital rectal exam shows some induration along the right anterior anal canal EXT:  non-tender without edema; no deformity NEURO: no gross focal deficits; no sign of tremor   IMPRESSION: #1 fistula in ano following treatment of perirectal abscess #2 mild fecal incontinence  PLAN: I provided the patient with written literature regarding anal fistula and there surgical management. We reviewed the diagrams in the brochure. I explained to him that this would require a surgical procedure for treatment.  Given the patient's history of mild fecal  incontinence, I would like him to be evaluated by my partner, Dr. Romie Levee, who is a colorectal specialist. We have made arrangements for consultation with her tomorrow morning.  Velora Heckler, MD, [facility] Office: [phone]

## 2012-07-30 ENCOUNTER — Encounter (INDEPENDENT_AMBULATORY_CARE_PROVIDER_SITE_OTHER): Payer: Self-pay | Admitting: General Surgery

## 2012-07-30 ENCOUNTER — Ambulatory Visit (INDEPENDENT_AMBULATORY_CARE_PROVIDER_SITE_OTHER): Payer: Medicare Other | Admitting: General Surgery

## 2012-07-30 ENCOUNTER — Encounter (HOSPITAL_BASED_OUTPATIENT_CLINIC_OR_DEPARTMENT_OTHER): Payer: Self-pay | Admitting: *Deleted

## 2012-07-30 VITALS — BP 132/78 | HR 72 | Temp 97.4°F | Resp 14 | Ht 68.0 in | Wt 310.0 lb

## 2012-07-30 DIAGNOSIS — K603 Anal fistula: Secondary | ICD-10-CM

## 2012-07-30 NOTE — Progress Notes (Signed)
Chief Complaint  Patient presents with  . established patient    eval fistula and mild fecal incont.     HISTORY:  Patient is a 57-year-old male treated for large perirectal abscess approximately 6 months ago on the general surgical service. Postoperatively the patient did well. Over the past few months however he has noted occasional bleeding with bowel movements. He has had mild discomfort with bowel movements, and he has noted passing gas through a small opening external to the anus.  It is intermittent in nature.  His bowel habits are regular and his bowel movements are soft.  His fiber intake is dietary.  His last colonoscopy was ~5-6 yrs ago.  He is due again in 4-5 yrs.     Past Medical History  Diagnosis Date  . Sleep apnea     CPAP q night- last study 2010  . Arthritis   . Hypertension       Past Surgical History  Procedure Laterality Date  . Back surgery    . Shoulder arthroscopy with rotator cuff repair      LEFT  . Spine surgery      screws  - due to injury - ruptured disc  . Cervical disc surgery    . Hydrocele excision    . Incision and drainage perirectal abscess  02/13/2012    Procedure: IRRIGATION AND DEBRIDEMENT PERIRECTAL ABSCESS;  Surgeon: Todd M Gerkin, MD;  Location: [facility] OR;  Service: General;  Laterality: N/A;        Current Outpatient Prescriptions  Medication Sig Dispense Refill  . aspirin 81 MG tablet Take 81 mg by mouth daily.      . cholecalciferol (VITAMIN D) 400 UNITS TABS Take 2,000 Units by mouth.      . hydrocortisone-pramoxine (ANALPRAM-HC) 2.5-1 % rectal cream Apply to anal area three times daily as needed.  Do NOT use applicator.  30 g  1  . loperamide (IMODIUM) 2 MG capsule Take 1 capsule (2 mg total) by mouth as needed for diarrhea or loose stools.  30 capsule  0  . meloxicam (MOBIC) 15 MG tablet Take 15 mg by mouth daily.      . Olmesartan-Amlodipine-HCTZ (TRIBENZOR) 40-5-25 MG TABS Take 1 tablet by mouth daily.      . omeprazole (PRILOSEC) 40  MG capsule Take 40 mg by mouth daily.      . simvastatin (ZOCOR) 20 MG tablet Take 20 mg by mouth every evening.      . sulfamethoxazole-trimethoprim (BACTRIM DS) 800-160 MG per tablet        No current facility-administered medications for this visit.      Allergies  Allergen Reactions  . Hydrocodeine (Dihydrocodeine) Nausea And Vomiting      History reviewed. No pertinent family history.  History   Social History  . Marital Status: Married    Spouse Name: N/A    Number of Children: N/A  . Years of Education: N/A   Social History Main Topics  . Smoking status: Former Smoker    Types: Cigarettes    Quit date: 02/12/2004  . Smokeless tobacco: Never Used  . Alcohol Use: Yes     Comment: occassional  . Drug Use: No  . Sexually Active:    Other Topics Concern  . None   Social History Narrative  . None      REVIEW OF SYSTEMS - PERTINENT POSITIVES ONLY: Review of Systems - General ROS: negative for - chills, fever or weight loss Hematological and   Lymphatic ROS: negative for - bleeding problems, blood clots or bruising Respiratory ROS: no cough, shortness of breath, or wheezing Cardiovascular ROS: no chest pain or dyspnea on exertion Gastrointestinal ROS: no abdominal pain, change in bowel habits, or black or bloody stools Genito-Urinary ROS: no dysuria, trouble voiding, or hematuria  EXAM: Filed Vitals:   07/30/12 1057  BP: 132/78  Pulse: 72  Temp: 97.4 F (36.3 C)  Resp: 14    General appearance: alert and cooperative Resp: clear to auscultation bilaterally Cardio: regular rate and rhythm GI: normal findings: soft, non-tender  Anal Exam Findings: R ant external opening, no palpable internal masses noted.  Good sphincter tone and squeeze    ASSESSMENT AND PLAN: Evan Smith is a 57 y.o. M s/p abscess I&D several months prior.  He is still having occasional bloody drainage from a site external to his anus.  On exam he has an external opening at the R  anterior position.  I think he would benefit from an anal EUA.  Given his mild incontinence to gas, there is a strong possibility that he would need a seton placed for drainage and possible MAF or LIFT procedure after the tract has matured.  If the fistula tract is superficial, then I will just perform a fistulotomy.  We discussed the different procedures in detail, including risk of incontinence and cure rates.  He understands that the final decision will be made intraoperatively.      Jakhia Buxton C Jarissa Sheriff, MD Colon and Rectal Surgery / General Surgery [facility]      Visit Diagnoses: 1. Perianal fistula     Primary Care Physician: STEADMAN, DONNA F, NP  

## 2012-07-30 NOTE — Patient Instructions (Addendum)

## 2012-07-30 NOTE — Progress Notes (Signed)
NPO AFTER MN. ARRIVES AT 0600. NEEDS ISTAT AND EKG. WILL TAKE PRILOSEC AM OF SURG W/ SIP OF WATER.

## 2012-08-03 ENCOUNTER — Ambulatory Visit (HOSPITAL_BASED_OUTPATIENT_CLINIC_OR_DEPARTMENT_OTHER)
Admission: RE | Admit: 2012-08-03 | Discharge: 2012-08-03 | Disposition: A | Discharge status: Home / Self Care | Payer: Medicare Other | Source: Ambulatory Visit | Attending: General Surgery | Admitting: General Surgery

## 2012-08-03 ENCOUNTER — Encounter (HOSPITAL_BASED_OUTPATIENT_CLINIC_OR_DEPARTMENT_OTHER)
Admission: RE | Disposition: A | Discharge status: Home / Self Care | Payer: Self-pay | Source: Ambulatory Visit | Attending: General Surgery

## 2012-08-03 ENCOUNTER — Ambulatory Visit (HOSPITAL_BASED_OUTPATIENT_CLINIC_OR_DEPARTMENT_OTHER): Payer: Medicare Other | Admitting: Anesthesiology

## 2012-08-03 ENCOUNTER — Encounter (HOSPITAL_BASED_OUTPATIENT_CLINIC_OR_DEPARTMENT_OTHER): Payer: Self-pay | Admitting: *Deleted

## 2012-08-03 ENCOUNTER — Encounter (HOSPITAL_BASED_OUTPATIENT_CLINIC_OR_DEPARTMENT_OTHER): Payer: Self-pay | Admitting: Anesthesiology

## 2012-08-03 DIAGNOSIS — K603 Anal fistula, unspecified: Secondary | ICD-10-CM | POA: Insufficient documentation

## 2012-08-03 DIAGNOSIS — I10 Essential (primary) hypertension: Secondary | ICD-10-CM | POA: Insufficient documentation

## 2012-08-03 DIAGNOSIS — K219 Gastro-esophageal reflux disease without esophagitis: Secondary | ICD-10-CM | POA: Insufficient documentation

## 2012-08-03 DIAGNOSIS — Z7982 Long term (current) use of aspirin: Secondary | ICD-10-CM | POA: Insufficient documentation

## 2012-08-03 DIAGNOSIS — Z79899 Other long term (current) drug therapy: Secondary | ICD-10-CM | POA: Insufficient documentation

## 2012-08-03 DIAGNOSIS — G4730 Sleep apnea, unspecified: Secondary | ICD-10-CM | POA: Insufficient documentation

## 2012-08-03 DIAGNOSIS — K648 Other hemorrhoids: Secondary | ICD-10-CM | POA: Insufficient documentation

## 2012-08-03 HISTORY — DX: Reserved for concepts with insufficient information to code with codable children: IMO0002

## 2012-08-03 HISTORY — DX: Obstructive sleep apnea (adult) (pediatric): G47.33

## 2012-08-03 HISTORY — DX: Anal fistula, unspecified: K60.30

## 2012-08-03 HISTORY — DX: Obstructive sleep apnea (adult) (pediatric): Z99.89

## 2012-08-03 HISTORY — DX: Other specified postprocedural states: Z98.890

## 2012-08-03 HISTORY — DX: Nausea with vomiting, unspecified: R11.2

## 2012-08-03 HISTORY — DX: Anal fistula: K60.3

## 2012-08-03 HISTORY — DX: Gastro-esophageal reflux disease without esophagitis: K21.9

## 2012-08-03 HISTORY — PX: EVALUATION UNDER ANESTHESIA WITH ANAL FISTULECTOMY: SHX5621

## 2012-08-03 LAB — POCT I-STAT 4, (NA,K, GLUC, HGB,HCT)
Hemoglobin: 16 g/dL (ref 13.0–17.0)
Sodium: 139 mEq/L (ref 135–145)

## 2012-08-03 SURGERY — EXAM UNDER ANESTHESIA WITH ANAL FISTULECTOMY
Anesthesia: Monitor Anesthesia Care | Site: Anus | Wound class: Clean Contaminated

## 2012-08-03 MED ORDER — SODIUM CHLORIDE 0.9 % IJ SOLN
3.0000 mL | INTRAMUSCULAR | Status: DC | PRN
  Filled 2012-08-03: qty 3

## 2012-08-03 MED ORDER — LACTATED RINGERS IV SOLN
INTRAVENOUS | Status: DC
  Administered 2012-08-03: 07:00:00 via INTRAVENOUS
  Filled 2012-08-03: qty 1000

## 2012-08-03 MED ORDER — DIAZEPAM 5 MG PO TABS: 5.0000 mg | ORAL_TABLET | Freq: Four times a day (QID) | ORAL | Status: DC | PRN

## 2012-08-03 MED ORDER — PROPOFOL 10 MG/ML IV BOLUS
INTRAVENOUS | Status: DC | PRN
  Administered 2012-08-03 (×2): 20 mg via INTRAVENOUS

## 2012-08-03 MED ORDER — ACETAMINOPHEN 325 MG PO TABS
650.0000 mg | ORAL_TABLET | ORAL | Status: DC | PRN
  Filled 2012-08-03: qty 2

## 2012-08-03 MED ORDER — MIDAZOLAM HCL 5 MG/5ML IJ SOLN
INTRAMUSCULAR | Status: DC | PRN
  Administered 2012-08-03: 2 mg via INTRAVENOUS
  Administered 2012-08-03 (×2): 1 mg via INTRAVENOUS

## 2012-08-03 MED ORDER — LIDOCAINE 5 % EX OINT
TOPICAL_OINTMENT | CUTANEOUS | Status: DC | PRN
  Administered 2012-08-03: 1

## 2012-08-03 MED ORDER — DEXAMETHASONE SODIUM PHOSPHATE 4 MG/ML IJ SOLN
INTRAMUSCULAR | Status: DC | PRN
  Administered 2012-08-03: 10 mg via INTRAVENOUS

## 2012-08-03 MED ORDER — MEPERIDINE HCL 25 MG/ML IJ SOLN
6.2500 mg | INTRAMUSCULAR | Status: DC | PRN
  Filled 2012-08-03: qty 1

## 2012-08-03 MED ORDER — OXYCODONE HCL 5 MG PO TABS
5.0000 mg | ORAL_TABLET | ORAL | Status: DC | PRN
  Filled 2012-08-03: qty 2

## 2012-08-03 MED ORDER — ACETAMINOPHEN 10 MG/ML IV SOLN
1000.0000 mg | Freq: Once | INTRAVENOUS | Status: DC | PRN
  Filled 2012-08-03: qty 100

## 2012-08-03 MED ORDER — DOCUSATE SODIUM 100 MG PO CAPS: 100.0000 mg | ORAL_CAPSULE | Freq: Two times a day (BID) | ORAL | Status: DC

## 2012-08-03 MED ORDER — PROPOFOL 10 MG/ML IV EMUL
INTRAVENOUS | Status: DC | PRN
  Administered 2012-08-03: 50 ug/kg/min via INTRAVENOUS

## 2012-08-03 MED ORDER — KETOROLAC TROMETHAMINE 30 MG/ML IJ SOLN
INTRAMUSCULAR | Status: DC | PRN
  Administered 2012-08-03: 30 mg via INTRAVENOUS

## 2012-08-03 MED ORDER — OXYCODONE HCL 5 MG PO TABS
5.0000 mg | ORAL_TABLET | Freq: Once | ORAL | Status: DC | PRN
  Filled 2012-08-03: qty 1

## 2012-08-03 MED ORDER — BUPIVACAINE-EPINEPHRINE 0.25% -1:200000 IJ SOLN
INTRAMUSCULAR | Status: DC | PRN
  Administered 2012-08-03: 20 mL

## 2012-08-03 MED ORDER — ONDANSETRON HCL 4 MG/2ML IJ SOLN
INTRAMUSCULAR | Status: DC | PRN
  Administered 2012-08-03: 4 mg via INTRAVENOUS

## 2012-08-03 MED ORDER — ACETAMINOPHEN 650 MG RE SUPP
650.0000 mg | RECTAL | Status: DC | PRN
  Filled 2012-08-03: qty 1

## 2012-08-03 MED ORDER — OXYCODONE HCL 5 MG/5ML PO SOLN
5.0000 mg | Freq: Once | ORAL | Status: DC | PRN
  Filled 2012-08-03: qty 5

## 2012-08-03 MED ORDER — HYDROMORPHONE HCL PF 1 MG/ML IJ SOLN
0.2500 mg | INTRAMUSCULAR | Status: DC | PRN
  Filled 2012-08-03: qty 1

## 2012-08-03 MED ORDER — LACTATED RINGERS IV SOLN
INTRAVENOUS | Status: DC | PRN
  Administered 2012-08-03: 07:00:00 via INTRAVENOUS

## 2012-08-03 MED ORDER — PROMETHAZINE HCL 25 MG/ML IJ SOLN
6.2500 mg | INTRAMUSCULAR | Status: DC | PRN
  Filled 2012-08-03: qty 1

## 2012-08-03 MED ORDER — FENTANYL CITRATE 0.05 MG/ML IJ SOLN
INTRAMUSCULAR | Status: DC | PRN
  Administered 2012-08-03 (×8): 12.5 ug via INTRAVENOUS

## 2012-08-03 MED ORDER — LIDOCAINE HCL (CARDIAC) 20 MG/ML IV SOLN
INTRAVENOUS | Status: DC | PRN
  Administered 2012-08-03: 60 mg via INTRAVENOUS

## 2012-08-03 MED ORDER — ONDANSETRON HCL 4 MG/2ML IJ SOLN
4.0000 mg | Freq: Four times a day (QID) | INTRAMUSCULAR | Status: DC | PRN
  Filled 2012-08-03: qty 2

## 2012-08-03 MED ORDER — OXYCODONE HCL 5 MG PO TABS
5.0000 mg | ORAL_TABLET | Freq: Four times a day (QID) | ORAL | Status: DC | PRN
Start: 2012-08-03 — End: 2012-10-08

## 2012-08-03 MED ORDER — PSYLLIUM 28 % PO PACK: 1.0000 | PACK | Freq: Two times a day (BID) | ORAL | Status: DC

## 2012-08-03 MED ORDER — SODIUM CHLORIDE 0.9 % IV SOLN
250.0000 mL | INTRAVENOUS | Status: DC | PRN
  Filled 2012-08-03: qty 250

## 2012-08-03 MED ORDER — SODIUM CHLORIDE 0.9 % IJ SOLN
3.0000 mL | Freq: Two times a day (BID) | INTRAMUSCULAR | Status: DC
  Filled 2012-08-03: qty 3

## 2012-08-03 SURGICAL SUPPLY — 47 items
BENZOIN TINCTURE PRP APPL 2/3 (GAUZE/BANDAGES/DRESSINGS) ×2 IMPLANT
BLADE HEX COATED 2.75 (ELECTRODE) ×2 IMPLANT
BLADE SURG 10 STRL SS (BLADE) IMPLANT
BLADE SURG 15 STRL LF DISP TIS (BLADE) ×1 IMPLANT
BLADE SURG 15 STRL SS (BLADE) ×1
BRIEF STRETCH FOR OB PAD LRG (UNDERPADS AND DIAPERS) ×2 IMPLANT
CANISTER SUCTION 2500CC (MISCELLANEOUS) ×2 IMPLANT
CLOTH BEACON ORANGE TIMEOUT ST (SAFETY) ×2 IMPLANT
COVER MAYO STAND STRL (DRAPES) ×2 IMPLANT
COVER TABLE BACK 60X90 (DRAPES) ×2 IMPLANT
DECANTER SPIKE VIAL GLASS SM (MISCELLANEOUS) IMPLANT
DRAPE LG THREE QUARTER DISP (DRAPES) ×4 IMPLANT
DRAPE PED LAPAROTOMY (DRAPES) ×2 IMPLANT
DRAPE UNDERBUTTOCKS STRL (DRAPE) IMPLANT
DRSG PAD ABDOMINAL 8X10 ST (GAUZE/BANDAGES/DRESSINGS) ×2 IMPLANT
ELECT BLADE 6.5 .24CM SHAFT (ELECTRODE) IMPLANT
ELECT REM PT RETURN 9FT ADLT (ELECTROSURGICAL) ×2
ELECTRODE REM PT RTRN 9FT ADLT (ELECTROSURGICAL) ×1 IMPLANT
GAUZE SPONGE 4X4 12PLY STRL LF (GAUZE/BANDAGES/DRESSINGS) ×2 IMPLANT
GAUZE SPONGE 4X4 16PLY XRAY LF (GAUZE/BANDAGES/DRESSINGS) IMPLANT
GAUZE VASELINE 3X9 (GAUZE/BANDAGES/DRESSINGS) IMPLANT
GLOVE BIO SURGEON STRL SZ 6.5 (GLOVE) ×4 IMPLANT
GLOVE INDICATOR 7.0 STRL GRN (GLOVE) ×4 IMPLANT
GOWN PREVENTION PLUS LG XLONG (DISPOSABLE) ×2 IMPLANT
GOWN PREVENTION PLUS XLARGE (GOWN DISPOSABLE) IMPLANT
GOWN STRL REIN XL XLG (GOWN DISPOSABLE) IMPLANT
LEGGING LITHOTOMY PAIR STRL (DRAPES) IMPLANT
MESH PANTY ×2 IMPLANT
NDL SAFETY ECLIPSE 18X1.5 (NEEDLE) IMPLANT
NEEDLE HYPO 18GX1.5 SHARP (NEEDLE)
NEEDLE HYPO 25X1 1.5 SAFETY (NEEDLE) ×2 IMPLANT
NS IRRIG 500ML POUR BTL (IV SOLUTION) ×2 IMPLANT
PACK BASIN DAY SURGERY FS (CUSTOM PROCEDURE TRAY) ×2 IMPLANT
PENCIL BUTTON HOLSTER BLD 10FT (ELECTRODE) ×2 IMPLANT
SPONGE GAUZE 4X4 12PLY (GAUZE/BANDAGES/DRESSINGS) ×2 IMPLANT
SPONGE SURGIFOAM ABS GEL 12-7 (HEMOSTASIS) IMPLANT
SUT CHROMIC 2 0 SH (SUTURE) ×4 IMPLANT
SUT CHROMIC 3 0 SH 27 (SUTURE) IMPLANT
SUT MON AB 3-0 SH 27 (SUTURE) ×1
SUT MON AB 3-0 SH27 (SUTURE) ×1 IMPLANT
SUT VIC AB 4-0 P-3 18XBRD (SUTURE) IMPLANT
SUT VIC AB 4-0 P3 18 (SUTURE)
SYR CONTROL 10ML LL (SYRINGE) ×2 IMPLANT
TOWEL OR 17X24 6PK STRL BLUE (TOWEL DISPOSABLE) ×2 IMPLANT
TRAY DSU PREP LF (CUSTOM PROCEDURE TRAY) ×2 IMPLANT
TUBE CONNECTING 12X1/4 (SUCTIONS) ×2 IMPLANT
YANKAUER SUCT BULB TIP NO VENT (SUCTIONS) ×2 IMPLANT

## 2012-08-03 NOTE — Anesthesia Postprocedure Evaluation (Signed)
Anesthesia Post Note  Patient: Evan Smith  Procedure(s) Performed: Procedure(s) (LRB): EXAM UNDER ANESTHESIA WITH ANAL FISTULECTOMY  (N/A)  Anesthesia type: MAC  Patient location: PACU  Post pain: Pain level controlled  Post assessment: Post-op Vital signs reviewed  Last Vitals: BP 116/73  Pulse 111  Temp(Src) 36.5 C (Oral)  Resp 16  Ht 5\' 8"  (1.727 m)  Wt 304 lb 5 oz (138.035 kg)  BMI 46.28 kg/m2  SpO2 97%  Post vital signs: Reviewed  Level of consciousness: awake  Complications: No apparent anesthesia complications

## 2012-08-03 NOTE — Anesthesia Procedure Notes (Signed)
Procedure Name: MAC Date/Time: 08/03/2012 7:37 AM Performed by: Jessica Priest Pre-anesthesia Checklist: Patient identified, Emergency Drugs available, Patient being monitored, Suction available and Timeout performed Patient Re-evaluated:Patient Re-evaluated prior to inductionOxygen Delivery Method: Simple face mask Preoxygenation: Pre-oxygenation with 100% oxygen Intubation Type: IV induction

## 2012-08-03 NOTE — Anesthesia Preprocedure Evaluation (Addendum)
Anesthesia Evaluation  Patient identified by MRN, date of birth, ID band Patient awake    Reviewed: Allergy & Precautions, H&P , NPO status , Patient's Chart, lab work & pertinent test results, reviewed documented beta blocker date and time   History of Anesthesia Complications (+) PONV and PROLONGED EMERGENCE  Airway Mallampati: III TM Distance: >3 FB Neck ROM: full    Dental  (+) Teeth Intact, Missing, Dental Advisory Given and Chipped   Pulmonary sleep apnea and Continuous Positive Airway Pressure Ventilation ,  breath sounds clear to auscultation        Cardiovascular hypertension, Pt. on medications Rhythm:regular     Neuro/Psych negative neurological ROS  negative psych ROS   GI/Hepatic Neg liver ROS, GERD-  Medicated,  Endo/Other  Morbid obesity  Renal/GU negative Renal ROS     Musculoskeletal   Abdominal   Peds  Hematology negative hematology ROS (+)   Anesthesia Other Findings See surgeon's H&P   Reproductive/Obstetrics                         Anesthesia Physical  Anesthesia Plan  ASA: III and emergent  Anesthesia Plan: MAC   Post-op Pain Management:    Induction: Intravenous  Airway Management Planned: Simple Face Mask and Nasal Cannula  Additional Equipment:   Intra-op Plan:   Post-operative Plan:   Informed Consent: I have reviewed the patients History and Physical, chart, labs and discussed the procedure including the risks, benefits and alternatives for the proposed anesthesia with the patient or authorized representative who has indicated his/her understanding and acceptance.   Dental advisory given  Plan Discussed with: CRNA  Anesthesia Plan Comments:         Anesthesia Quick Evaluation

## 2012-08-03 NOTE — Interval H&P Note (Signed)
History and Physical Interval Note:  08/03/2012 7:27 AM  Evan Smith  has presented today for surgery, with the diagnosis of anal fistula  The various methods of treatment have been discussed with the patient and family. After consideration of risks, benefits and other options for treatment, the patient has consented to  Procedure(s): EXAM UNDER ANESTHESIA WITH ANAL FISTULECTOMY POSSIBLE SECTON PLACEMENT (N/A) as a surgical intervention .  The patient's history has been reviewed, patient examined, no change in status, stable for surgery.  I have reviewed the patient's chart and labs.  Questions were answered to the patient's satisfaction.    We discussed the risks of surgery once again.  These mainly include pain and bleeding.  There is a risk of incontinence with fistulotomy.  I believe he understands these risks and has agreed to proceed.

## 2012-08-03 NOTE — H&P (View-Only) (Signed)
Chief Complaint  Patient presents with  . established patient    eval fistula and mild fecal incont.     HISTORY:  Patient is a 57 year old male treated for large perirectal abscess approximately 6 months ago on the general surgical service. Postoperatively the patient did well. Over the past few months however he has noted occasional bleeding with bowel movements. He has had mild discomfort with bowel movements, and he has noted passing gas through a small opening external to the anus.  It is intermittent in nature.  His bowel habits are regular and his bowel movements are soft.  His fiber intake is dietary.  His last colonoscopy was ~5-6 yrs ago.  He is due again in 4-5 yrs.     Past Medical History  Diagnosis Date  . Sleep apnea     CPAP q night- last study 2010  . Arthritis   . Hypertension       Past Surgical History  Procedure Laterality Date  . Back surgery    . Shoulder arthroscopy with rotator cuff repair      LEFT  . Spine surgery      screws  - due to injury - ruptured disc  . Cervical disc surgery    . Hydrocele excision    . Incision and drainage perirectal abscess  02/13/2012    Procedure: IRRIGATION AND DEBRIDEMENT PERIRECTAL ABSCESS;  Surgeon: Velora Heckler, MD;  Location: [facility] OR;  Service: General;  Laterality: N/A;        Current Outpatient Prescriptions  Medication Sig Dispense Refill  . aspirin 81 MG tablet Take 81 mg by mouth daily.      . cholecalciferol (VITAMIN D) 400 UNITS TABS Take 2,000 Units by mouth.      . hydrocortisone-pramoxine (ANALPRAM-HC) 2.5-1 % rectal cream Apply to anal area three times daily as needed.  Do NOT use applicator.  30 g  1  . loperamide (IMODIUM) 2 MG capsule Take 1 capsule (2 mg total) by mouth as needed for diarrhea or loose stools.  30 capsule  0  . meloxicam (MOBIC) 15 MG tablet Take 15 mg by mouth daily.      . Olmesartan-Amlodipine-HCTZ (TRIBENZOR) 40-5-25 MG TABS Take 1 tablet by mouth daily.      Marland Kitchen omeprazole (PRILOSEC) 40  MG capsule Take 40 mg by mouth daily.      . simvastatin (ZOCOR) 20 MG tablet Take 20 mg by mouth every evening.      . sulfamethoxazole-trimethoprim (BACTRIM DS) 800-160 MG per tablet        No current facility-administered medications for this visit.      Allergies  Allergen Reactions  . Hydrocodeine (Dihydrocodeine) Nausea And Vomiting      History reviewed. No pertinent family history.  History   Social History  . Marital Status: Married    Spouse Name: N/A    Number of Children: N/A  . Years of Education: N/A   Social History Main Topics  . Smoking status: Former Smoker    Types: Cigarettes    Quit date: 02/12/2004  . Smokeless tobacco: Never Used  . Alcohol Use: Yes     Comment: occassional  . Drug Use: No  . Sexually Active:    Other Topics Concern  . None   Social History Narrative  . None      REVIEW OF SYSTEMS - PERTINENT POSITIVES ONLY: Review of Systems - General ROS: negative for - chills, fever or weight loss Hematological and  Lymphatic ROS: negative for - bleeding problems, blood clots or bruising Respiratory ROS: no cough, shortness of breath, or wheezing Cardiovascular ROS: no chest pain or dyspnea on exertion Gastrointestinal ROS: no abdominal pain, change in bowel habits, or black or bloody stools Genito-Urinary ROS: no dysuria, trouble voiding, or hematuria  EXAM: Filed Vitals:   07/30/12 1057  BP: 132/78  Pulse: 72  Temp: 97.4 F (36.3 C)  Resp: 14    General appearance: alert and cooperative Resp: clear to auscultation bilaterally Cardio: regular rate and rhythm GI: normal findings: soft, non-tender  Anal Exam Findings: R ant external opening, no palpable internal masses noted.  Good sphincter tone and squeeze    ASSESSMENT AND PLAN: Evan Smith is a 57 y.o. M s/p abscess I&D several months prior.  He is still having occasional bloody drainage from a site external to his anus.  On exam he has an external opening at the R  anterior position.  I think he would benefit from an anal EUA.  Given his mild incontinence to gas, there is a strong possibility that he would need a seton placed for drainage and possible MAF or LIFT procedure after the tract has matured.  If the fistula tract is superficial, then I will just perform a fistulotomy.  We discussed the different procedures in detail, including risk of incontinence and cure rates.  He understands that the final decision will be made intraoperatively.      Vanita Panda, MD Colon and Rectal Surgery / General Surgery [facility]      Visit Diagnoses: 1. Perianal fistula     Primary Care Physician: Phill Myron, NP

## 2012-08-03 NOTE — Transfer of Care (Signed)
  Immediate Anesthesia Transfer of Care Note  Patient: Evan Smith  Procedure(s) Performed: Procedure(s) (LRB): EXAM UNDER ANESTHESIA WITH ANAL FISTULECTOMY  (N/A)  Patient Location: PACU  Anesthesia Type: General  Level of Consciousness: awake, sedated, patient cooperative and responds to stimulation  Airway & Oxygen Therapy: Patient Spontanous Breathing and Patient connected to face mask oxygen  Post-op Assessment: Report given to PACU RN, Post -op Vital signs reviewed and stable and Patient moving all extremities  Post vital signs: Reviewed and stable  Complications: No apparent anesthesia complications

## 2012-08-03 NOTE — Op Note (Signed)
08/03/2012  8:10 AM  PATIENT:  Evan Smith  57 y.o. male  Patient Care Team: Phill Myron, NP as PCP - General (Nurse Practitioner)  PRE-OPERATIVE DIAGNOSIS:  anal fistula  POST-OPERATIVE DIAGNOSIS:  Anal fistula  PROCEDURE:   EXAM UNDER ANESTHESIA WITH ANAL FISTULOTOMY  SURGEON:  Surgeon(s): Romie Levee, MD  ASSISTANT: none   ANESTHESIA:   local and MAC  EBL: min (<54ml)     Delay start of Pharmacological VTE agent (>24hrs) due to surgical blood loss or risk of bleeding:  no  DRAINS: none   SPECIMEN:  No Specimen  DISPOSITION OF SPECIMEN:  PATHOLOGY  COUNTS:  YES  PLAN OF CARE: Discharge to home after PACU  PATIENT DISPOSITION:  PACU - hemodynamically stable.  INDICATION: is a 57 year old male who is status post abscess drainage by Dr. Gerrit Friends.  He was referred to me for a perianal fistula after abscess had healed. He is here today for possible fistulotomy versus seton placement. The risks and benefits of the procedure were explained to the patient to the OR consent signed and placed on chart.  OR FINDINGS: superficial perianal fistula in the right anterior position. Minimal internal sphincter involved no external sphincter involved. Anal sphincter hypertension noted upon exam.  DESCRIPTION: The patient was identified in the preoperative holding area and taken to the OR where they were laid prone on the operating room table. MAC anesthesia was smoothly induced.  The patient was placed in the jackknife position with his buttocks gently taped apart. He was then prepped and draped in the usual sterile fashion. A surgical timeout was performed indicating the correct patient, procedure, positioning and preoperative antibiotics. SCDs were noted to be in place and functioning prior to the operation.  I began with a perirectal block using quarters percent Marcaine with epinephrine. This was done in standard circumferential fashion. I then performed a anoscopy using a  Hill-Ferguson anoscope. The patient had moderate internal hemorrhoid disease with no inflammation. There were no masses palpated or visualized. Anal sphincter hypertension was noted upon placement of the anoscope. The fistulas noted in the right anterior position with an internal opening noted in a direct radial fashion. A fistula probe was placed through the fistula easily. The fistula involved a small portion of the internal anal sphincter. There was no external anal sphincter involvement.  Given the minimal monofilament of the internal sphincter and the anal sphincter hypertension noted upon initial evaluation it was decided to perform fistulotomy. This was done the Bovie electric cautery. The fistula was marsupialized using a 2-0 chromic running suture. Lidocaine ointment was placed on this and a sterile dressing was applied. Patient was awakened anesthesia sent to the postanesthesia care unit in stable condition. All counts are correct the operative staff.

## 2012-08-04 ENCOUNTER — Encounter (HOSPITAL_BASED_OUTPATIENT_CLINIC_OR_DEPARTMENT_OTHER): Payer: Self-pay | Admitting: General Surgery

## 2012-08-18 ENCOUNTER — Telehealth: Payer: Self-pay | Admitting: Nurse Practitioner

## 2012-08-18 ENCOUNTER — Ambulatory Visit (INDEPENDENT_AMBULATORY_CARE_PROVIDER_SITE_OTHER): Payer: Medicare Other | Admitting: General Surgery

## 2012-08-18 ENCOUNTER — Encounter (INDEPENDENT_AMBULATORY_CARE_PROVIDER_SITE_OTHER): Payer: Self-pay | Admitting: General Surgery

## 2012-08-18 VITALS — BP 138/84 | HR 70 | Temp 98.6°F | Resp 16 | Ht 68.0 in | Wt 310.4 lb

## 2012-08-18 DIAGNOSIS — Z9889 Other specified postprocedural states: Secondary | ICD-10-CM

## 2012-08-18 NOTE — Progress Notes (Signed)
Evan Smith is a 57 y.o. male who is status post a fistulotomy on 6/23.  He is doing well.  He had minimal pain.  He is having regular BM's and no bleeding.   Objective: Filed Vitals:   08/18/12 1027  BP: 138/84  Pulse: 70  Temp: 98.6 F (37 C)  Resp: 16    General appearance: alert and cooperative GI: normal findings: soft, non-tender  Incision: healing well   Assessment: s/p  Patient Active Problem List   Diagnosis Date Noted  . Anal fistula 07/29/2012  . Fecal incontinence, mild 07/29/2012  . Anal or rectal pain 04/13/2012  . Hypertension 02/13/2012  . Hyperlipidemia 02/13/2012  . Sleep apnea 02/13/2012    Plan: Doing well.  Follow up as needed    .Vanita Panda, MD [facility], [place] [phone]   08/18/2012 10:35 AM

## 2012-08-18 NOTE — Patient Instructions (Signed)
Return to the office as needed 

## 2012-08-19 NOTE — Telephone Encounter (Signed)
Patient aware we are out of samples.  

## 2012-08-20 ENCOUNTER — Telehealth: Payer: Self-pay | Admitting: Nurse Practitioner

## 2012-08-21 ENCOUNTER — Other Ambulatory Visit: Payer: Self-pay

## 2012-08-21 MED ORDER — OLMESARTAN-AMLODIPINE-HCTZ 40-5-25 MG PO TABS: 1.0000 | ORAL_TABLET | Freq: Every day | ORAL | Status: DC

## 2012-08-21 NOTE — Telephone Encounter (Signed)
Done by Eunice Blase today

## 2012-08-21 NOTE — Telephone Encounter (Signed)
Last seen 11.14.13    DFS   No upcoming appt. scheduled

## 2012-09-28 ENCOUNTER — Other Ambulatory Visit: Payer: Self-pay

## 2012-09-28 MED ORDER — OLMESARTAN-AMLODIPINE-HCTZ 40-5-25 MG PO TABS: 1.0000 | ORAL_TABLET | Freq: Every day | ORAL | Status: DC

## 2012-09-28 NOTE — Telephone Encounter (Signed)
Last seen 11/4.13  DFS

## 2012-10-08 ENCOUNTER — Encounter: Payer: Self-pay | Admitting: Family Medicine

## 2012-10-08 ENCOUNTER — Ambulatory Visit (INDEPENDENT_AMBULATORY_CARE_PROVIDER_SITE_OTHER): Payer: Medicare Other | Admitting: Family Medicine

## 2012-10-08 VITALS — BP 138/84 | HR 85 | Temp 97.9°F | Ht 68.0 in | Wt 311.2 lb

## 2012-10-08 DIAGNOSIS — K219 Gastro-esophageal reflux disease without esophagitis: Secondary | ICD-10-CM

## 2012-10-08 DIAGNOSIS — M549 Dorsalgia, unspecified: Secondary | ICD-10-CM

## 2012-10-08 DIAGNOSIS — E785 Hyperlipidemia, unspecified: Secondary | ICD-10-CM

## 2012-10-08 DIAGNOSIS — I10 Essential (primary) hypertension: Secondary | ICD-10-CM

## 2012-10-08 MED ORDER — SIMVASTATIN 20 MG PO TABS: 20.0000 mg | ORAL_TABLET | Freq: Every evening | ORAL | Status: DC

## 2012-10-08 MED ORDER — OMEPRAZOLE 40 MG PO CPDR: 40.0000 mg | DELAYED_RELEASE_CAPSULE | Freq: Every morning | ORAL | Status: DC

## 2012-10-08 MED ORDER — AMLODIPINE BESYLATE 5 MG PO TABS: 5.0000 mg | ORAL_TABLET | Freq: Every day | ORAL | Status: DC

## 2012-10-08 MED ORDER — PROMETHAZINE HCL 25 MG PO TABS: 25.0000 mg | ORAL_TABLET | Freq: Four times a day (QID) | ORAL | Status: DC | PRN

## 2012-10-08 MED ORDER — OXYCODONE HCL 5 MG PO TABS: 5.0000 mg | ORAL_TABLET | Freq: Four times a day (QID) | ORAL | Status: DC | PRN

## 2012-10-08 MED ORDER — LOSARTAN POTASSIUM-HCTZ 100-25 MG PO TABS: 1.0000 | ORAL_TABLET | Freq: Every day | ORAL | Status: DC

## 2012-10-08 NOTE — Progress Notes (Signed)
  Subjective:    Patient ID: Evan Smith, male    DOB: 1955-08-17, 57 y.o.   MRN: [id number]  HPI This 57 y.o. male presents for evaluation of back pain.  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  He is c/o of some decreased ROM and lumbar muscle pain and denies Any radicular sx's.   He has hx of hypertension and is having difficulty affording his tribenzor and was Wondering if he could get his bp meds changed. He is not fasting and is not prepared for labs this am.   Review of Systems C/o back pain. No chest pain, SOB, HA, dizziness, vision change, N/V, diarrhea, constipation, dysuria, urinary urgency or frequency, myalgias, arthralgias or rash.     Objective:   Physical Exam  Vital signs noted  Well developed well nourished obese AA male.  HEENT - Head atraumatic Normocephalic                Eyes - PERRLA, Conjuctiva - clear Sclera- Clear EOMI                Ears - EAC's Wnl TM's Wnl Gross Hearing WNL                Nose - Nares patent                 Throat - oropharanx wnl Respiratory - Lungs CTA bilateral Cardiac - RRR S1 and S2 without murmur GI - Abdomen soft Nontender and bowel sounds active x 4 Extremities - No edema. Neuro - Grossly intact. MS - Decreased ROM LS spine and TTP bilateral lumbar paraspinous muscles.      Assessment & Plan:  Back pain - Plan: oxyCODONE (OXY IR/ROXICODONE) 5 MG immediate release tablet  GERD (gastroesophageal reflux disease) - Plan: omeprazole (PRILOSEC) 40 MG capsule  Essential hypertension, benign - Plan: losartan-hydrochlorothiazide (HYZAAR) 100-25 MG per tablet, amLODipine (NORVASC) 5 MG tablet.  Take bp at home and if not less than 140/90 then follow up.  Other and unspecified hyperlipidemia - Plan: simvastatin (ZOCOR) 20 MG tablet  Follow up in 6 months fasting for CPE and follow up.

## 2012-10-08 NOTE — Patient Instructions (Signed)

## 2012-11-04 ENCOUNTER — Other Ambulatory Visit: Payer: Medicare Other

## 2012-11-04 ENCOUNTER — Other Ambulatory Visit: Payer: Self-pay | Admitting: Family Medicine

## 2012-11-04 DIAGNOSIS — I10 Essential (primary) hypertension: Secondary | ICD-10-CM

## 2012-11-05 ENCOUNTER — Other Ambulatory Visit: Payer: Medicare Other

## 2012-11-06 LAB — BMP8+EGFR
BUN/Creatinine Ratio: 11 (ref 9–20)
BUN: 15 mg/dL (ref 6–24)
CO2: 26 mmol/L (ref 18–29)
Calcium: 9.7 mg/dL (ref 8.7–10.2)
Chloride: 98 mmol/L (ref 97–108)
Creatinine, Ser: 1.34 mg/dL — ABNORMAL HIGH (ref 0.76–1.27)
GFR calc Af Amer: 67 mL/min/{1.73_m2} (ref >59)
GFR calc non Af Amer: 58 mL/min/{1.73_m2} — ABNORMAL LOW (ref >59)
Glucose: 101 mg/dL — ABNORMAL HIGH (ref 65–99)
Potassium: 4.4 mmol/L (ref 3.5–5.2)
Sodium: 137 mmol/L (ref 134–144)

## 2012-12-17 ENCOUNTER — Other Ambulatory Visit: Payer: Self-pay

## 2012-12-28 ENCOUNTER — Telehealth: Payer: Self-pay | Admitting: Family Medicine

## 2013-01-11 ENCOUNTER — Ambulatory Visit (INDEPENDENT_AMBULATORY_CARE_PROVIDER_SITE_OTHER): Payer: Medicare Other | Admitting: Family Medicine

## 2013-01-11 ENCOUNTER — Encounter: Payer: Self-pay | Admitting: Family Medicine

## 2013-01-11 VITALS — BP 143/80 | HR 102 | Temp 98.2°F | Ht 68.0 in | Wt 310.8 lb

## 2013-01-11 DIAGNOSIS — J329 Chronic sinusitis, unspecified: Secondary | ICD-10-CM

## 2013-01-11 MED ORDER — AMOXICILLIN 875 MG PO TABS: 875.0000 mg | ORAL_TABLET | Freq: Two times a day (BID) | ORAL | Status: DC

## 2013-01-11 MED ORDER — PREDNISONE 10 MG PO TABS: ORAL_TABLET | ORAL | Status: DC

## 2013-01-11 NOTE — Progress Notes (Signed)
   Subjective:    Patient ID: Evan Smith, male    DOB: 1955/12/07, 57 y.o.   MRN: [id number]  HPI  This 57 y.o. male presents for evaluation of uri sx's for over a week.  Review of Systems No chest pain, SOB, HA, dizziness, vision change, N/V, diarrhea, constipation, dysuria, urinary urgency or frequency, myalgias, arthralgias or rash.     Objective:   Physical Exam  Vital signs noted  Well developed well nourished male.  HEENT - Head atraumatic Normocephalic                Eyes - PERRLA, Conjuctiva - clear Sclera- Clear EOMI                Ears - EAC's Wnl TM's Wnl Gross Hearing WNL                Nose - Nares patent                 Throat - oropharanx wnl Respiratory - Lungs CTA bilateral Cardiac - RRR S1 and S2 without murmur GI - Abdomen soft Nontender and bowel sounds active x 4 Extremities - No edema. Neuro - Grossly intact.      Assessment & Plan:  Sinusitis - Plan: predniSONE (DELTASONE) 10 MG tablet, amoxicillin (AMOXIL) 875 MG tablet Push po fluids, rest and follow up prn  Deatra Canter FNP

## 2013-02-12 ENCOUNTER — Telehealth: Payer: Self-pay | Admitting: Family Medicine

## 2013-02-15 ENCOUNTER — Telehealth: Payer: Self-pay | Admitting: Family Medicine

## 2013-02-15 ENCOUNTER — Encounter: Payer: Self-pay | Admitting: Family Medicine

## 2013-02-15 ENCOUNTER — Ambulatory Visit (INDEPENDENT_AMBULATORY_CARE_PROVIDER_SITE_OTHER): Payer: Medicare HMO | Admitting: Family Medicine

## 2013-02-15 VITALS — BP 128/75 | HR 94 | Temp 98.9°F | Ht 68.0 in | Wt 308.0 lb

## 2013-02-15 DIAGNOSIS — R6889 Other general symptoms and signs: Secondary | ICD-10-CM

## 2013-02-15 DIAGNOSIS — J329 Chronic sinusitis, unspecified: Secondary | ICD-10-CM

## 2013-02-15 DIAGNOSIS — R509 Fever, unspecified: Secondary | ICD-10-CM

## 2013-02-15 DIAGNOSIS — B349 Viral infection, unspecified: Secondary | ICD-10-CM

## 2013-02-15 DIAGNOSIS — R059 Cough, unspecified: Secondary | ICD-10-CM

## 2013-02-15 DIAGNOSIS — R05 Cough: Secondary | ICD-10-CM

## 2013-02-15 DIAGNOSIS — J029 Acute pharyngitis, unspecified: Secondary | ICD-10-CM

## 2013-02-15 LAB — POCT RAPID STREP A (OFFICE): Rapid Strep A Screen: NEGATIVE

## 2013-02-15 LAB — POCT INFLUENZA A/B
INFLUENZA A, POC: NEGATIVE
INFLUENZA B, POC: NEGATIVE

## 2013-02-15 MED ORDER — AMOXICILLIN 875 MG PO TABS: 875.0000 mg | ORAL_TABLET | Freq: Two times a day (BID) | ORAL | Status: DC

## 2013-02-15 NOTE — Progress Notes (Signed)
Subjective:    Patient ID: Evan Smith, male    DOB: 04/20/1955, 58 y.o.   MRN: [id number]  HPI Patient here today for flu-like symptoms started on Friday. Patient comes in today with his wife after feeling bad for 3 days. He initially had 1 bout with diarrhea. Since then he has had chills and plus minus on the fever. He did not get a flu shot. He complains of sore throat headache drainage and some cough. The cough comes from the drainage in the back of his throat.      Patient Active Problem List   Diagnosis Date Noted  . Anal or rectal pain 04/13/2012  . Hypertension 02/13/2012  . Hyperlipidemia 02/13/2012  . Sleep apnea 02/13/2012   Outpatient Encounter Prescriptions as of 02/15/2013  Medication Sig  . aspirin 81 MG tablet Take 81 mg by mouth daily.  . Cholecalciferol (VITAMIN D3) 1000 UNITS CAPS Take 1 capsule by mouth daily.  Marland Kitchen. losartan-hydrochlorothiazide (HYZAAR) 100-25 MG per tablet Take 1 tablet by mouth daily.  Marland Kitchen. omeprazole (PRILOSEC) 40 MG capsule Take 1 capsule (40 mg total) by mouth every morning.  . Potassium Gluconate 80 MG TABS Take 99 mg by mouth.  . simvastatin (ZOCOR) 20 MG tablet Take 1 tablet (20 mg total) by mouth every evening.  . [DISCONTINUED] diazepam (VALIUM) 5 MG tablet Take 1 tablet (5 mg total) by mouth every 6 (six) hours as needed (muscle spasm, inability to urinate).  Marland Kitchen. amLODipine (NORVASC) 5 MG tablet Take 1 tablet (5 mg total) by mouth daily.  . [DISCONTINUED] amoxicillin (AMOXIL) 875 MG tablet Take 1 tablet (875 mg total) by mouth 2 (two) times daily.  . [DISCONTINUED] oxyCODONE (OXY IR/ROXICODONE) 5 MG immediate release tablet Take 1-2 tablets (5-10 mg total) by mouth every 6 (six) hours as needed for pain.  . [DISCONTINUED] predniSONE (DELTASONE) 10 MG tablet Take 4 po qd x 2 days then 3 po qd x 2days then 2po qd x 2 days 1 po qd x 2 days    Review of Systems  Constitutional: Positive for fever.  HENT: Positive for congestion, ear pain  (right is worse), postnasal drip, sinus pressure and sore throat.   Eyes: Positive for itching.  Respiratory: Positive for choking.   Cardiovascular: Negative.   Gastrointestinal: Positive for nausea. Diarrhea: some on friday.  Endocrine: Negative.   Genitourinary: Negative.   Musculoskeletal: Negative.   Skin: Negative.   Allergic/Immunologic: Negative.   Neurological: Positive for dizziness and headaches.  Hematological: Negative.   Psychiatric/Behavioral: Negative.        Objective:   Physical Exam  Nursing note and vitals reviewed. Constitutional: He is oriented to person, place, and time. He appears well-developed and well-nourished. No distress.  HENT:  Head: Normocephalic and atraumatic.  Right Ear: External ear normal.  Left Ear: External ear normal.  Mouth/Throat: Oropharynx is clear and moist. No oropharyngeal exudate.  The frontal ethmoid and maxillary sinus tenderness. Bilateral nasal congestion. There was slight swelling in the posterior throat but this was difficult to fully visualize do to a strong gag reflex.  Eyes: Conjunctivae and EOM are normal. Pupils are equal, round, and reactive to light. Right eye exhibits no discharge. Left eye exhibits no discharge. No scleral icterus.  Neck: Normal range of motion. Neck supple. No thyromegaly present.  No carotid bruits  Cardiovascular: Normal rate, regular rhythm, normal heart sounds and intact distal pulses.  Exam reveals no gallop and no friction rub.   No murmur  heard. At 84 per minute  Pulmonary/Chest: Effort normal and breath sounds normal. No respiratory distress. He has no wheezes. He has no rales. He exhibits no tenderness.  Dry cough  Abdominal: Soft. Bowel sounds are normal. He exhibits no mass. There is no tenderness. There is no rebound and no guarding.  Musculoskeletal: Normal range of motion.  Lymphadenopathy:    He has no cervical adenopathy.  Neurological: He is alert and oriented to person, place, and  time. He has normal reflexes.  Skin: Skin is warm and dry. No rash noted.  Psychiatric: He has a normal mood and affect. His behavior is normal. Judgment and thought content normal.   BP 128/75  Pulse 94  Temp(Src) 98.9 F (37.2 C) (Oral)  Ht 5\' 8"  (1.727 m)  Wt 308 lb (139.708 kg)  BMI 46.84 kg/m2  Results for orders placed in visit on 02/15/13  POCT INFLUENZA A/B      Result Value Range   Influenza A, POC Negative     Influenza B, POC Negative    POCT RAPID STREP A (OFFICE)      Result Value Range   Rapid Strep A Screen Negative  Negative         Assessment & Plan:  1. Flu-like symptoms - POCT Influenza A/B - POCT rapid strep A - Strep A culture, throat - amoxicillin (AMOXIL) 875 MG tablet; Take 1 tablet (875 mg total) by mouth 2 (two) times daily.  Dispense: 20 tablet; Refill: 0  2. Sinusitis - amoxicillin (AMOXIL) 875 MG tablet; Take 1 tablet (875 mg total) by mouth 2 (two) times daily.  Dispense: 20 tablet; Refill: 0  3. Viral syndrome  4. Sore throat Patient Instructions  Drink plenty of fluids Take Tylenol for aches pains and fever Use saline nose spray over-the-counter frequently Take Mucinex maximum strength blue and white in color, over-the-counter, one twice daily for cough and Use a cool mist humidifier in her bedroom at night   Nyra Capes MD

## 2013-02-15 NOTE — Patient Instructions (Signed)
Drink plenty of fluids Take Tylenol for aches pains and fever Use saline nose spray over-the-counter frequently Take Mucinex maximum strength blue and white in color, over-the-counter, one twice daily for cough and Use a cool mist humidifier in her bedroom at night

## 2013-02-15 NOTE — Telephone Encounter (Signed)
appt given for today 

## 2013-02-16 NOTE — Telephone Encounter (Signed)
Patient was seen in our office and new script written.

## 2013-02-17 LAB — STREP A CULTURE, THROAT: STREP A CULTURE: NEGATIVE

## 2013-04-12 ENCOUNTER — Ambulatory Visit (INDEPENDENT_AMBULATORY_CARE_PROVIDER_SITE_OTHER): Payer: Medicare HMO | Admitting: Family Medicine

## 2013-04-12 ENCOUNTER — Encounter: Payer: Self-pay | Admitting: Family Medicine

## 2013-04-12 VITALS — BP 125/77 | HR 83 | Temp 98.3°F | Ht 68.0 in | Wt 304.8 lb

## 2013-04-12 DIAGNOSIS — Z0000 Encounter for general adult medical examination without abnormal findings: Secondary | ICD-10-CM

## 2013-04-12 DIAGNOSIS — E785 Hyperlipidemia, unspecified: Secondary | ICD-10-CM

## 2013-04-12 DIAGNOSIS — M549 Dorsalgia, unspecified: Secondary | ICD-10-CM

## 2013-04-12 DIAGNOSIS — I10 Essential (primary) hypertension: Secondary | ICD-10-CM

## 2013-04-12 LAB — POCT CBC
Granulocyte percent: 55.2 %G (ref 37–80)
HCT, POC: 44.5 % (ref 43.5–53.7)
Hemoglobin: 13.9 g/dL — AB (ref 14.1–18.1)
Lymph, poc: 1.4 (ref 0.6–3.4)
MCH, POC: 29.4 pg (ref 27–31.2)
MCHC: 31.3 g/dL — AB (ref 31.8–35.4)
MCV: 93.9 fL (ref 80–97)
MPV: 7.8 fL (ref 0–99.8)
POC Granulocyte: 1.9 — AB (ref 2–6.9)
POC LYMPH PERCENT: 39.4 %L (ref 10–50)
Platelet Count, POC: 195 10*3/uL (ref 142–424)
RBC: 4.7 M/uL (ref 4.69–6.13)
RDW, POC: 13.8 %
WBC: 3.5 10*3/uL — AB (ref 4.6–10.2)

## 2013-04-12 MED ORDER — CYCLOBENZAPRINE HCL 10 MG PO TABS: 10.0000 mg | ORAL_TABLET | Freq: Three times a day (TID) | ORAL | Status: DC | PRN

## 2013-04-12 MED ORDER — MELOXICAM 15 MG PO TABS: 15.0000 mg | ORAL_TABLET | Freq: Every day | ORAL | Status: DC

## 2013-04-12 NOTE — Progress Notes (Signed)
   Subjective:    Patient ID: KULE GASCOIGNE, male    DOB: 08/27/1955, 58 y.o.   MRN: [id number]  HPI This 58 y.o. male presents for evaluation of back pain.  He has hx of DDD of the LS spine And he has had surgery.  He states he gets pain in his lower back and this causes him to Have reduced abilities with ADL's.  He has hx of hypertension and hyperlipidemia.   Review of Systems No chest pain, SOB, HA, dizziness, vision change, N/V, diarrhea, constipation, dysuria, urinary urgency or frequency, myalgias, arthralgias or rash.     Objective:   Physical Exam Vital signs noted  Well developed well nourished male.  HEENT - Head atraumatic Normocephalic                Eyes - PERRLA, Conjuctiva - clear Sclera- Clear EOMI                Ears - EAC's Wnl TM's Wnl Gross Hearing WNL                Nose - Nares patent                 Throat - oropharanx wnl Respiratory - Lungs CTA bilateral Cardiac - RRR S1 and S2 without murmur GI - Abdomen soft Nontender and bowel sounds active x 4 Extremities - No edema. Neuro - Grossly intact.       Assessment & Plan:  Back pain - Plan: meloxicam (MOBIC) 15 MG tablet, cyclobenzaprine (FLEXERIL) 10 MG tablet  Routine general medical examination at a health care facility - Plan: POCT CBC, CMP14+EGFR, Lipid panel, PSA, total and free, Thyroid Panel With TSH  Unspecified essential hypertension - Plan: POCT CBC, CMP14+EGFR  Other and unspecified hyperlipidemia - Plan: Lipid panel  Lysbeth Penner FNP

## 2013-04-12 NOTE — Patient Instructions (Signed)
Back Pain, Adult Low back pain is very common. About 1 in 5 people have back pain.The cause of low back pain is rarely dangerous. The pain often gets better over time.About half of people with a sudden onset of back pain feel better in just 2 weeks. About 8 in 10 people feel better by 6 weeks.  CAUSES Some common causes of back pain include:  Strain of the muscles or ligaments supporting the spine.  Wear and tear (degeneration) of the spinal discs.  Arthritis.  Direct injury to the back. DIAGNOSIS Most of the time, the direct cause of low back pain is not known.However, back pain can be treated effectively even when the exact cause of the pain is unknown.Answering your caregiver's questions about your overall health and symptoms is one of the most accurate ways to make sure the cause of your pain is not dangerous. If your caregiver needs more information, he or she may order lab work or imaging tests (X-rays or MRIs).However, even if imaging tests show changes in your back, this usually does not require surgery. HOME CARE INSTRUCTIONS For many people, back pain returns.Since low back pain is rarely dangerous, it is often a condition that people can learn to manageon their own.   Remain active. It is stressful on the back to sit or stand in one place. Do not sit, drive, or stand in one place for more than 30 minutes at a time. Take short walks on level surfaces as soon as pain allows.Try to increase the length of time you walk each day.  Do not stay in bed.Resting more than 1 or 2 days can delay your recovery.  Do not avoid exercise or work.Your body is made to move.It is not dangerous to be active, even though your back may hurt.Your back will likely heal faster if you return to being active before your pain is gone.  Pay attention to your body when you bend and lift. Many people have less discomfortwhen lifting if they bend their knees, keep the load close to their bodies,and  avoid twisting. Often, the most comfortable positions are those that put less stress on your recovering back.  Find a comfortable position to sleep. Use a firm mattress and lie on your side with your knees slightly bent. If you lie on your back, put a pillow under your knees.  Only take over-the-counter or prescription medicines as directed by your caregiver. Over-the-counter medicines to reduce pain and inflammation are often the most helpful.Your caregiver may prescribe muscle relaxant drugs.These medicines help dull your pain so you can more quickly return to your normal activities and healthy exercise.  Put ice on the injured area.  Put ice in a plastic bag.  Place a towel between your skin and the bag.  Leave the ice on for 15-20 minutes, 03-04 times a day for the first 2 to 3 days. After that, ice and heat may be alternated to reduce pain and spasms.  Ask your caregiver about trying back exercises and gentle massage. This may be of some benefit.  Avoid feeling anxious or stressed.Stress increases muscle tension and can worsen back pain.It is important to recognize when you are anxious or stressed and learn ways to manage it.Exercise is a great option. SEEK MEDICAL CARE IF:  You have pain that is not relieved with rest or medicine.  You have pain that does not improve in 1 week.  You have new symptoms.  You are generally not feeling well. SEEK   IMMEDIATE MEDICAL CARE IF:   You have pain that radiates from your back into your legs.  You develop new bowel or bladder control problems.  You have unusual weakness or numbness in your arms or legs.  You develop nausea or vomiting.  You develop abdominal pain.  You feel faint. Document Released: 01/28/2005 Document Revised: 07/30/2011 Document Reviewed: 06/18/2010 ExitCare Patient Information 2014 [employer].  

## 2013-04-13 ENCOUNTER — Other Ambulatory Visit: Payer: Self-pay | Admitting: Family Medicine

## 2013-04-13 LAB — LIPID PANEL
Chol/HDL Ratio: 4.8 ratio units (ref 0.0–5.0)
Cholesterol, Total: 253 mg/dL — ABNORMAL HIGH (ref 100–199)
HDL: 53 mg/dL (ref >39)
LDL Calculated: 161 mg/dL — ABNORMAL HIGH (ref 0–99)
Triglycerides: 197 mg/dL — ABNORMAL HIGH (ref 0–149)
VLDL Cholesterol Cal: 39 mg/dL (ref 5–40)

## 2013-04-13 LAB — CMP14+EGFR
ALT: 24 IU/L (ref 0–44)
AST: 23 IU/L (ref 0–40)
Albumin/Globulin Ratio: 1.9 (ref 1.1–2.5)
Albumin: 4.5 g/dL (ref 3.5–5.5)
Alkaline Phosphatase: 57 IU/L (ref 39–117)
BUN/Creatinine Ratio: 11 (ref 9–20)
BUN: 15 mg/dL (ref 6–24)
CO2: 25 mmol/L (ref 18–29)
Calcium: 9.6 mg/dL (ref 8.7–10.2)
Chloride: 97 mmol/L (ref 97–108)
Creatinine, Ser: 1.34 mg/dL — ABNORMAL HIGH (ref 0.76–1.27)
GFR calc Af Amer: 67 mL/min/{1.73_m2} (ref >59)
GFR calc non Af Amer: 58 mL/min/{1.73_m2} — ABNORMAL LOW (ref >59)
Globulin, Total: 2.4 g/dL (ref 1.5–4.5)
Glucose: 105 mg/dL — ABNORMAL HIGH (ref 65–99)
Potassium: 4.5 mmol/L (ref 3.5–5.2)
Sodium: 140 mmol/L (ref 134–144)
Total Bilirubin: 0.6 mg/dL (ref 0.0–1.2)
Total Protein: 6.9 g/dL (ref 6.0–8.5)

## 2013-04-13 LAB — THYROID PANEL WITH TSH
Free Thyroxine Index: 2 (ref 1.2–4.9)
T3 Uptake Ratio: 29 % (ref 24–39)
T4, Total: 6.9 ug/dL (ref 4.5–12.0)
TSH: 3.62 u[IU]/mL (ref 0.450–4.500)

## 2013-04-13 LAB — PSA, TOTAL AND FREE
PSA, Free Pct: 37.5 %
PSA, Free: 0.15 ng/mL
PSA: 0.4 ng/mL (ref 0.0–4.0)

## 2013-04-13 MED ORDER — SIMVASTATIN 40 MG PO TABS: 40.0000 mg | ORAL_TABLET | Freq: Every day | ORAL | Status: DC

## 2013-09-10 ENCOUNTER — Telehealth: Payer: Self-pay | Admitting: Family Medicine

## 2013-09-13 ENCOUNTER — Other Ambulatory Visit: Payer: Self-pay | Admitting: Family Medicine

## 2013-09-13 MED ORDER — SIMVASTATIN 40 MG PO TABS
40.0000 mg | ORAL_TABLET | Freq: Every day | ORAL | Status: DC
Start: 2013-09-13 — End: 2013-11-24

## 2013-09-13 NOTE — Telephone Encounter (Signed)
Simvastatin refilled and sent to pharm

## 2013-09-23 NOTE — Progress Notes (Signed)
Pharmacy questioned Zocor 40 mg since he is also taking Norvasc. Ander SladeBill Oxford, FNP wrote on fax to continue Zocor 40 and [facility]

## 2013-10-09 ENCOUNTER — Other Ambulatory Visit: Payer: Self-pay | Admitting: Family Medicine

## 2013-11-01 ENCOUNTER — Other Ambulatory Visit: Payer: Self-pay | Admitting: Family Medicine

## 2013-11-02 NOTE — Telephone Encounter (Signed)
Last ov 3/15. ntbs 

## 2013-11-24 ENCOUNTER — Ambulatory Visit (INDEPENDENT_AMBULATORY_CARE_PROVIDER_SITE_OTHER): Payer: Medicare HMO | Admitting: Family Medicine

## 2013-11-24 ENCOUNTER — Encounter: Payer: Self-pay | Admitting: Family Medicine

## 2013-11-24 VITALS — BP 122/78 | HR 71 | Temp 97.8°F | Ht 68.0 in | Wt 298.0 lb

## 2013-11-24 DIAGNOSIS — Z23 Encounter for immunization: Secondary | ICD-10-CM

## 2013-11-24 DIAGNOSIS — K2100 Gastro-esophageal reflux disease with esophagitis, without bleeding: Secondary | ICD-10-CM

## 2013-11-24 DIAGNOSIS — M1990 Unspecified osteoarthritis, unspecified site: Secondary | ICD-10-CM

## 2013-11-24 DIAGNOSIS — G629 Polyneuropathy, unspecified: Secondary | ICD-10-CM

## 2013-11-24 DIAGNOSIS — E785 Hyperlipidemia, unspecified: Secondary | ICD-10-CM

## 2013-11-24 DIAGNOSIS — I10 Essential (primary) hypertension: Secondary | ICD-10-CM

## 2013-11-24 LAB — POCT CBC
Granulocyte percent: 50 %G (ref 37–80)
HCT, POC: 43.4 % — AB (ref 43.5–53.7)
Hemoglobin: 13.9 g/dL — AB (ref 14.1–18.1)
Lymph, poc: 1.8 (ref 0.6–3.4)
MCH, POC: 29.9 pg (ref 27–31.2)
MCHC: 32.1 g/dL (ref 31.8–35.4)
MCV: 93.3 fL (ref 80–97)
MPV: 7.6 fL (ref 0–99.8)
POC Granulocyte: 2 (ref 2–6.9)
POC LYMPH PERCENT: 46.1 %L (ref 10–50)
Platelet Count, POC: 175 10*3/uL (ref 142–424)
RBC: 4.7 M/uL (ref 4.69–6.13)
RDW, POC: 13.5 %
WBC: 3.9 10*3/uL — AB (ref 4.6–10.2)

## 2013-11-24 MED ORDER — OMEPRAZOLE 40 MG PO CPDR: 40.0000 mg | DELAYED_RELEASE_CAPSULE | Freq: Every morning | ORAL | Status: DC

## 2013-11-24 MED ORDER — SIMVASTATIN 40 MG PO TABS: 40.0000 mg | ORAL_TABLET | Freq: Every day | ORAL | Status: DC

## 2013-11-24 MED ORDER — MELOXICAM 15 MG PO TABS: 15.0000 mg | ORAL_TABLET | Freq: Every day | ORAL | Status: DC

## 2013-11-24 MED ORDER — AMLODIPINE BESYLATE 5 MG PO TABS: 5.0000 mg | ORAL_TABLET | Freq: Every day | ORAL | Status: DC

## 2013-11-24 MED ORDER — CYCLOBENZAPRINE HCL 10 MG PO TABS: 10.0000 mg | ORAL_TABLET | Freq: Three times a day (TID) | ORAL | Status: DC | PRN

## 2013-11-24 MED ORDER — GABAPENTIN 300 MG PO CAPS: 300.0000 mg | ORAL_CAPSULE | Freq: Three times a day (TID) | ORAL | Status: DC

## 2013-11-24 MED ORDER — LOSARTAN POTASSIUM-HCTZ 100-25 MG PO TABS: ORAL_TABLET | ORAL | Status: DC

## 2013-11-24 NOTE — Progress Notes (Signed)
   Subjective:    Patient ID: Evan Smith, male    DOB: 04/11/1955, 58 y.o.   MRN: [id number]  HPI This 58 year old AA male presents for evaluation of routine follow up.  He has hx of htn and hyperlipidemia. He has hx of DDD of the lumbar and cervical spine and he has hx of surgery on back and neck. He has numbness and tingling in his feet and his hands and he has some discomfort that keeps up at night.   Review of Systems C/o numbness and tingling   No chest pain, SOB, HA, dizziness, vision change, N/V, diarrhea, constipation, dysuria, urinary urgency or frequency, myalgias, arthralgias or rash  Objective:   Physical Exam  Vital signs noted  Well developed well nourished male.  HEENT - Head atraumatic Normocephalic                Eyes - PERRLA, Conjuctiva - clear Sclera- Clear EOMI                Ears - EAC's Wnl TM's Wnl Gross Hearing WNL                Nose - Nares patent                 Throat - oropharanx wnl Respiratory - Lungs CTA bilateral Cardiac - RRR S1 and S2 without murmur GI - Abdomen soft Nontender and bowel sounds active x 4 Extremities - No edema. Neuro - Grossly intact.      Assessment & Plan:  Essential hypertension, benign - Plan: POCT CBC, CMP14+EGFR, losartan-hydrochlorothiazide (HYZAAR) 100-25 MG per tablet, amLODipine (NORVASC) 5 MG tablet  Hyperlipemia - Plan: CMP14+EGFR, Lipid panel, simvastatin (ZOCOR) 40 MG tablet  Gastroesophageal reflux disease with esophagitis - Plan: omeprazole (PRILOSEC) 40 MG capsule  Arthritis - Plan: meloxicam (MOBIC) 15 MG tablet  Neuropathy - Plan: gabapentin (NEURONTIN) 300 MG capsule, Vitamin B12  Encounter for immunization  Lysbeth Penner FNP

## 2013-11-25 LAB — LIPID PANEL
Chol/HDL Ratio: 3 ratio units (ref 0.0–5.0)
Cholesterol, Total: 148 mg/dL (ref 100–199)
HDL: 49 mg/dL (ref >39)
LDL Calculated: 74 mg/dL (ref 0–99)
Triglycerides: 124 mg/dL (ref 0–149)
VLDL Cholesterol Cal: 25 mg/dL (ref 5–40)

## 2013-11-25 LAB — CMP14+EGFR
ALT: 34 IU/L (ref 0–44)
AST: 29 IU/L (ref 0–40)
Albumin/Globulin Ratio: 2.1 (ref 1.1–2.5)
Albumin: 4.7 g/dL (ref 3.5–5.5)
Alkaline Phosphatase: 56 IU/L (ref 39–117)
BUN/Creatinine Ratio: 12 (ref 9–20)
BUN: 15 mg/dL (ref 6–24)
CO2: 25 mmol/L (ref 18–29)
Calcium: 9.5 mg/dL (ref 8.7–10.2)
Chloride: 100 mmol/L (ref 97–108)
Creatinine, Ser: 1.29 mg/dL — ABNORMAL HIGH (ref 0.76–1.27)
GFR calc Af Amer: 70 mL/min/{1.73_m2} (ref >59)
GFR calc non Af Amer: 60 mL/min/{1.73_m2} (ref >59)
Globulin, Total: 2.2 g/dL (ref 1.5–4.5)
Glucose: 101 mg/dL — ABNORMAL HIGH (ref 65–99)
Potassium: 5 mmol/L (ref 3.5–5.2)
Sodium: 140 mmol/L (ref 134–144)
Total Bilirubin: 0.4 mg/dL (ref 0.0–1.2)
Total Protein: 6.9 g/dL (ref 6.0–8.5)

## 2013-11-25 LAB — VITAMIN B12: Vitamin B-12: 565 pg/mL (ref 211–946)

## 2013-12-18 IMAGING — CT CT PELVIS W/O CM
2 of 4 series · 16 of 46 positions shown, 18 images · non-contrast
Comparison: None

CLINICAL DATA: Chronic rectal pain.  Worsening over the past week.
Question perirectal abscess.  Unable to obtain IV contrast.

CT PELVIS WITHOUT CONTRAST
TECHNIQUE: Multidetector CT imaging of the pelvis was performed
following the standard protocol without intravenous contrast.

[Series 2: pelvis 5.0 b40f · axial · 0.73mm/px · z∈[-630,-380]mm · 13 of 56 slices shown, 15 images]
[im 3/56  soft-tissue]
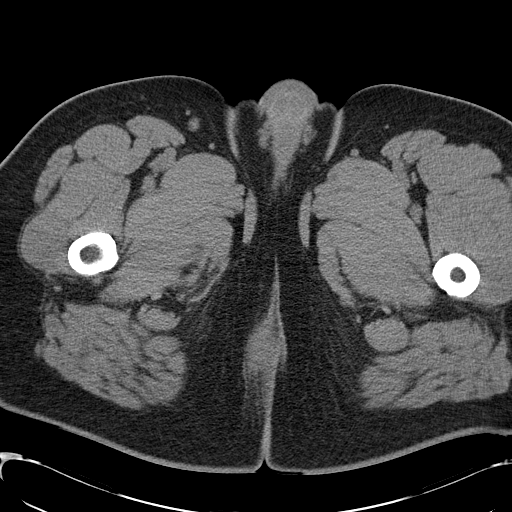
[im 3/56  bone]
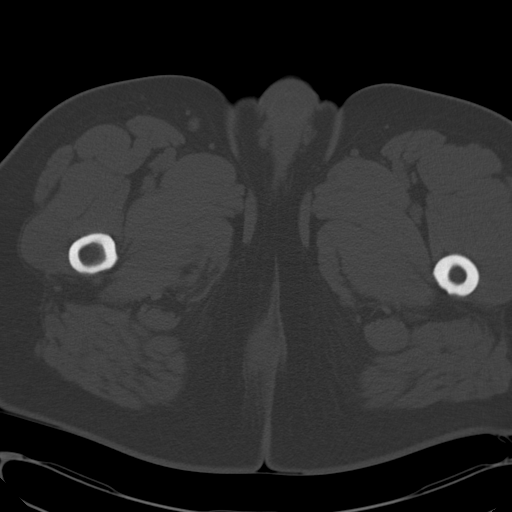
[im 8/56  soft-tissue]
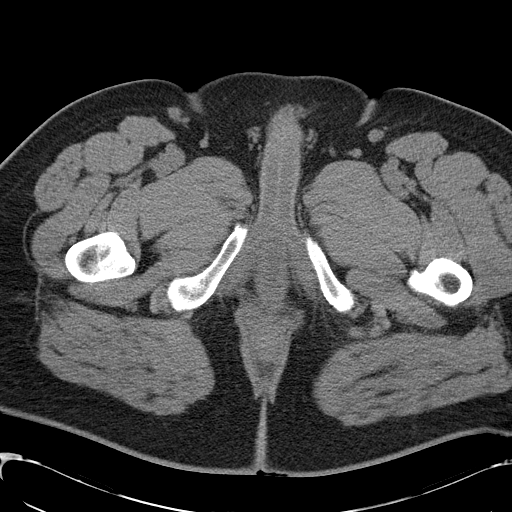
[im 12/56  soft-tissue]
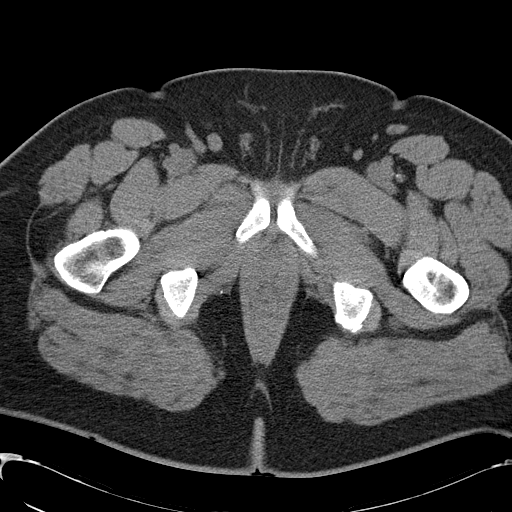
[im 15/56  soft-tissue]
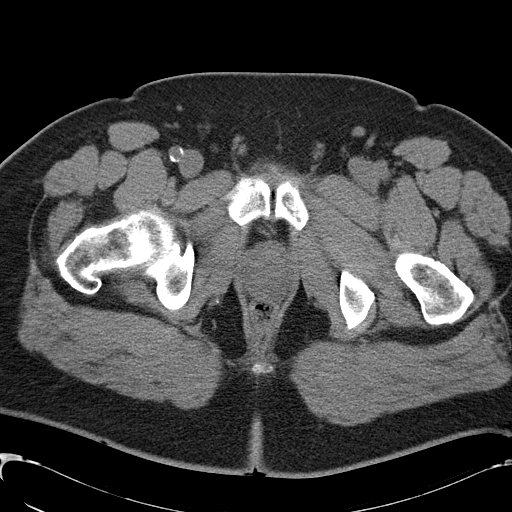
[im 20/56  soft-tissue]
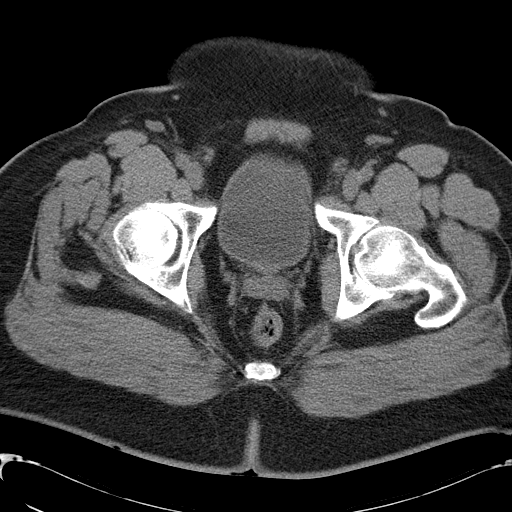
[im 24/56  soft-tissue]
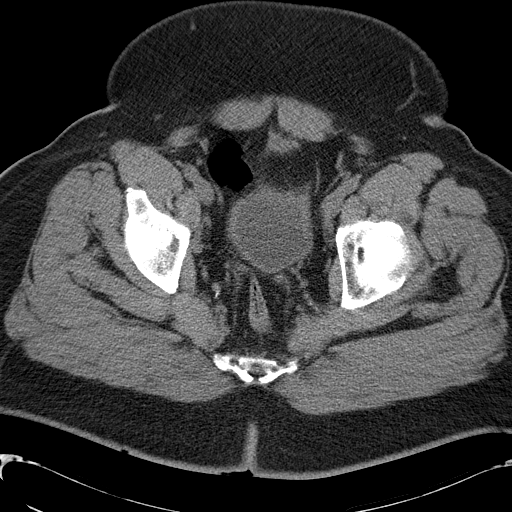
[im 29/56  soft-tissue]
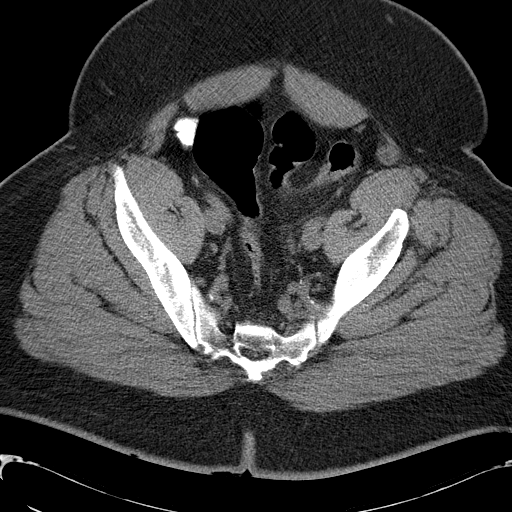
[im 32/56  soft-tissue]
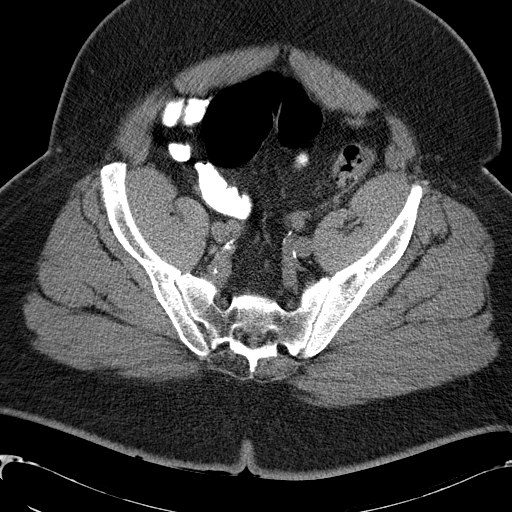
[im 36/56  soft-tissue]
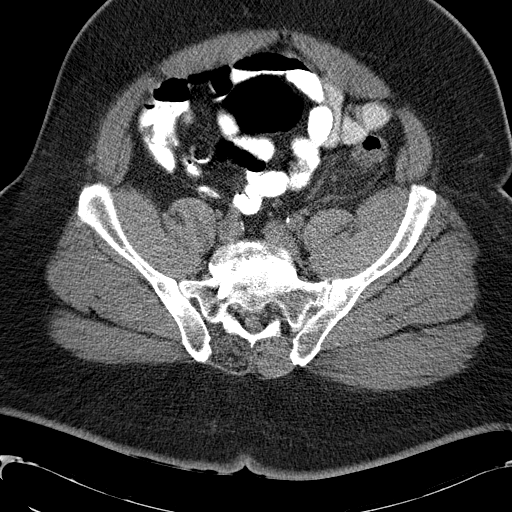
[im 36/56  bone]
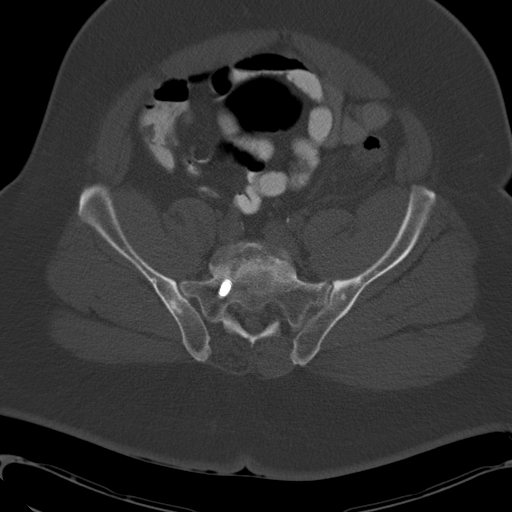
[im 41/56  soft-tissue]
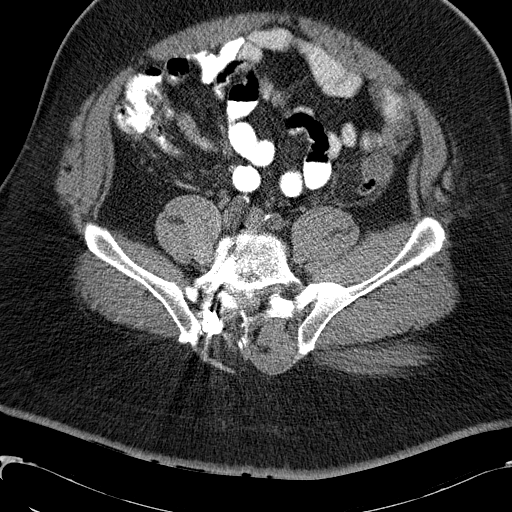
[im 44/56  soft-tissue]
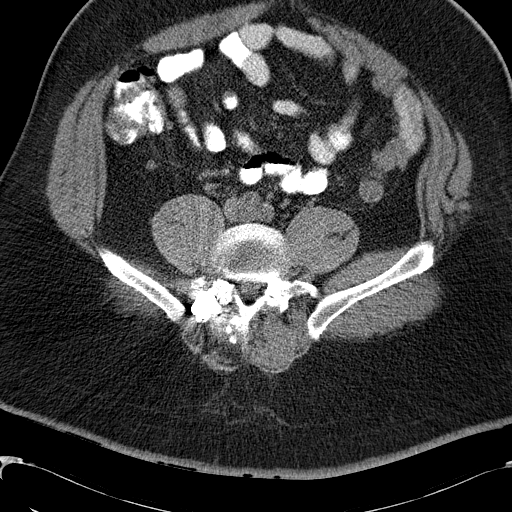
[im 48/56  soft-tissue]
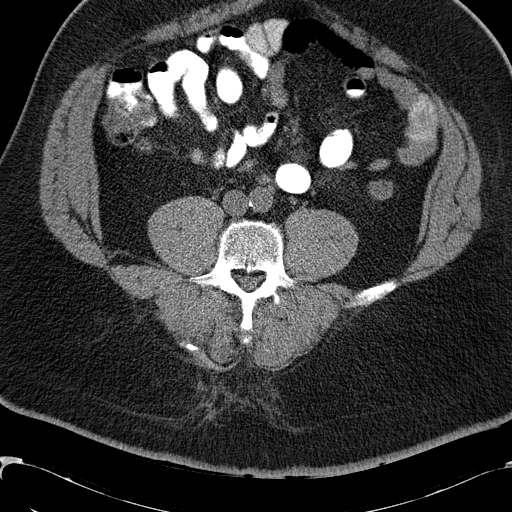
[im 53/56  soft-tissue]
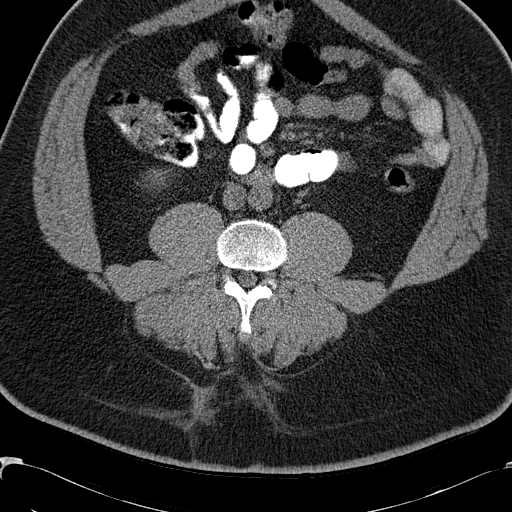

[Series 4: mpr coronal a/p cor · coronal · 0.58mm/px · 3 of 106 slices shown]
[im 36/106  soft-tissue]
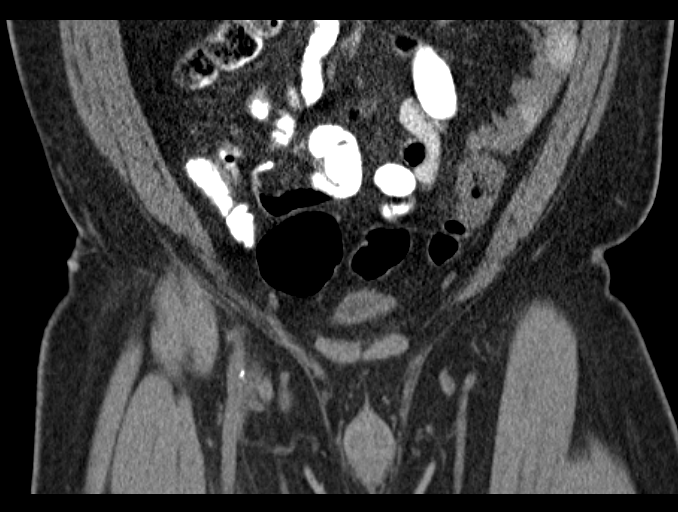
[im 47/106  soft-tissue]
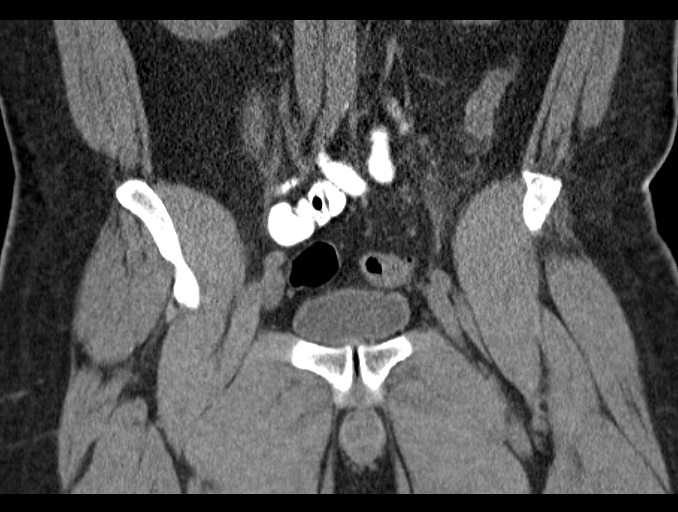
[im 59/106  soft-tissue]
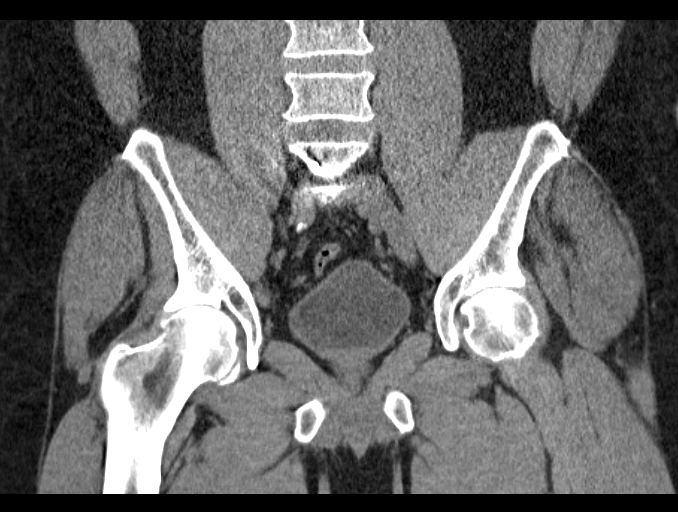

[16 of 46 positions shown; findings below may reference images not displayed]

FINDINGS: Decrease sensitivity exam secondary to unenhanced
technique.  There is subtle asymmetry to the perianal fat plans,
eccentric right, including on image 52/series 2.  No well-defined
fluid or gas collection is seen.

The appearance of the rectum and perirectal fat is normal.  Sigmoid
diverticulosis.  A suggestion of mild pericolonic edema and fascial
thickening adjacent the descending/sigmoid junction, including on
image 18/series 2.  Normal terminal ileum and appendix.  Pelvic
small bowel loops are within normal limits.

Mildly prominent bilateral inguinal nodes, which are likely
reactive. Normal urinary bladder and prostate.  No significant free
fluid.  Lumbosacral spine fixation.  Right-sided iliac sclerosis is
favored to be degenerative.  Similar to 2010.
IMPRESSION: 1.  Low sensitivity exam, secondary to unenhanced CT technique.
2.  Subtle soft tissue asymmetry about the right perianal region.
Cannot exclude cellulitis or a tiny perianal abscess/fistula.  The
test of choice is dedicated anorectal MRI with contrast.  This
could be performed as an outpatient.
3.  Findings suspicious for descending/sigmoid colon junction
diverticulitis.

## 2014-01-19 ENCOUNTER — Ambulatory Visit (INDEPENDENT_AMBULATORY_CARE_PROVIDER_SITE_OTHER): Payer: Medicare HMO | Admitting: Family Medicine

## 2014-01-19 ENCOUNTER — Encounter: Payer: Self-pay | Admitting: Family Medicine

## 2014-01-19 VITALS — BP 122/75 | HR 95 | Temp 98.3°F | Ht 68.0 in | Wt 301.4 lb

## 2014-01-19 DIAGNOSIS — J206 Acute bronchitis due to rhinovirus: Secondary | ICD-10-CM

## 2014-01-19 MED ORDER — AMOXICILLIN 875 MG PO TABS: 875.0000 mg | ORAL_TABLET | Freq: Two times a day (BID) | ORAL | Status: DC

## 2014-01-19 MED ORDER — METHYLPREDNISOLONE ACETATE 80 MG/ML IJ SUSP
80.0000 mg | Freq: Once | INTRAMUSCULAR | Status: AC
  Administered 2014-01-19: 80 mg via INTRAMUSCULAR

## 2014-01-19 NOTE — Progress Notes (Signed)
   Subjective:    Patient ID: Evan Calkinayton W Smith, male    DOB: 03/08/1955, 58 y.o.   MRN: [id number]  HPI  Patient is here with c/o uri sx's  Review of Systems  Constitutional: Negative for fever.  HENT: Negative for ear pain.   Eyes: Negative for discharge.  Respiratory: Negative for cough.   Cardiovascular: Negative for chest pain.  Gastrointestinal: Negative for abdominal distention.  Endocrine: Negative for polyuria.  Genitourinary: Negative for difficulty urinating.  Musculoskeletal: Negative for gait problem and neck pain.  Skin: Negative for color change and rash.  Neurological: Negative for speech difficulty and headaches.  Psychiatric/Behavioral: Negative for agitation.       Objective:    BP 122/75 mmHg  Pulse 95  Temp(Src) 98.3 F (36.8 C) (Oral)  Ht 5\' 8"  (1.727 m)  Wt 301 lb 6.4 oz (136.714 kg)  BMI 45.84 kg/m2   Physical Exam  Constitutional: He is oriented to person, place, and time. He appears well-developed and well-nourished.  HENT:  Head: Normocephalic and atraumatic.  Mouth/Throat: Oropharynx is clear and moist.  Eyes: Pupils are equal, round, and reactive to light.  Neck: Normal range of motion. Neck supple.  Cardiovascular: Normal rate and regular rhythm.   No murmur heard. Pulmonary/Chest: Effort normal and breath sounds normal.  Abdominal: Soft. Bowel sounds are normal. There is no tenderness.  Neurological: He is alert and oriented to person, place, and time.  Skin: Skin is warm and dry.  Psychiatric: He has a normal mood and affect.          Assessment & Plan:     ICD-9-CM ICD-10-CM   1. Acute bronchitis due to Rhinovirus 466.0 J20.6 amoxicillin (AMOXIL) 875 MG tablet   079.3  methylPREDNISolone acetate (DEPO-MEDROL) injection 80 mg   Push po fluids, rest, tylenol and motrin otc prn as directed for fever, arthralgias, and myalgias.  Follow up prn if sx's continue or persist.  No Follow-up on file.  Deatra CanterWilliam J Tyshun Tuckerman FNP

## 2014-01-21 ENCOUNTER — Other Ambulatory Visit: Payer: Self-pay | Admitting: Family Medicine

## 2014-01-21 ENCOUNTER — Telehealth: Payer: Self-pay | Admitting: Family Medicine

## 2014-01-21 MED ORDER — BENZONATATE 100 MG PO CAPS: 200.0000 mg | ORAL_CAPSULE | Freq: Three times a day (TID) | ORAL | Status: DC | PRN

## 2014-01-21 NOTE — Telephone Encounter (Signed)
Tessalon perles sent to pharm for cough

## 2014-01-21 NOTE — Telephone Encounter (Signed)
Pt wants something else for his cough, is there anything else we can give him, please advise

## 2014-01-24 NOTE — Telephone Encounter (Signed)
Aware, script for cough medicine sent in.

## 2014-05-12 ENCOUNTER — Ambulatory Visit (INDEPENDENT_AMBULATORY_CARE_PROVIDER_SITE_OTHER): Payer: Medicare HMO | Admitting: Nurse Practitioner

## 2014-05-12 ENCOUNTER — Encounter: Payer: Self-pay | Admitting: Nurse Practitioner

## 2014-05-12 ENCOUNTER — Telehealth: Payer: Self-pay | Admitting: Family Medicine

## 2014-05-12 VITALS — BP 128/76 | HR 95 | Temp 97.4°F | Ht 68.0 in | Wt 302.0 lb

## 2014-05-12 DIAGNOSIS — J0100 Acute maxillary sinusitis, unspecified: Secondary | ICD-10-CM | POA: Diagnosis not present

## 2014-05-12 MED ORDER — AMOXICILLIN 875 MG PO TABS: 875.0000 mg | ORAL_TABLET | Freq: Two times a day (BID) | ORAL | Status: DC

## 2014-05-12 MED ORDER — METHYLPREDNISOLONE ACETATE 80 MG/ML IJ SUSP
80.0000 mg | Freq: Once | INTRAMUSCULAR | Status: AC
  Administered 2014-05-12: 80 mg via INTRAMUSCULAR

## 2014-05-12 NOTE — Addendum Note (Signed)
Addended by: Bennie PieriniMARTIN, MARY-MARGARET on: 05/12/2014 05:40 PM   Modules accepted: Orders

## 2014-05-12 NOTE — Progress Notes (Addendum)
  Subjective:     Gertha Calkinayton W Balducci is a 59 y.o. male who presents for evaluation of sinus pain. Symptoms include: congestion, cough, facial pain, foul rhinorrhea, headaches, nasal congestion and sneezing. Onset of symptoms was 2 weeks ago. Symptoms have been gradually worsening since that time. Past history is significant for no history of pneumonia or bronchitis. Patient is a former smoker, quit 8 years ago.  The following portions of the patient's history were reviewed and updated as appropriate: allergies, current medications, past family history, past medical history, past social history, past surgical history and problem list.  Review of Systems Pertinent items are noted in HPI.   Objective:    BP 128/76 mmHg  Pulse 95  Temp(Src) 97.4 F (36.3 C) (Oral)  Ht 5\' 8"  (1.727 m)  Wt 302 lb (136.986 kg)  BMI 45.93 kg/m2 General appearance: alert and cooperative Eyes: conjunctivae/corneas clear. PERRL, EOM's intact. Fundi benign. Ears: normal TM's and external ear canals both ears Nose: Nares normal. Septum midline. Mucosa normal. No drainage or sinus tenderness., clear discharge, moderate congestion, turbinates red, sinus tenderness bilateral Throat: lips, mucosa, and tongue normal; teeth and gums normal Neck: no adenopathy, no carotid bruit, no JVD, supple, symmetrical, trachea midline and thyroid not enlarged, symmetric, no tenderness/mass/nodules Lungs: clear to auscultation bilaterally Heart: regular rate and rhythm, S1, S2 normal, no murmur, click, rub or gallop    Assessment:    Acute bacterial sinusitis.    Plan:   Meds ordered this encounter  Medications  . amoxicillin (AMOXIL) 875 MG tablet    Sig: Take 1 tablet (875 mg total) by mouth 2 (two) times daily. 1 po BID    Dispense:  20 tablet    Refill:  0    Order Specific Question:  Supervising Provider    Answer:  Ernestina PennaMOORE, DONALD W [[id number]]  . methylPREDNISolone acetate (DEPO-MEDROL) injection 80 mg    Sig:     1. Take  meds as prescribed 2. Use a cool mist humidifier especially during the winter months and when heat has been humid. 3. Use saline nose sprays frequently 4. Saline irrigations of the nose can be very helpful if done frequently.  * 4X daily for 1 week*  * Use of a nettie pot can be helpful with this. Follow directions with this* 5. Drink plenty of fluids 6. Keep thermostat turn down low 7.For any cough or congestion  Use plain Mucinex- regular strength or max strength is fine for cough- or may use nyquil   * Children- consult with Pharmacist for dosing 8. For fever or aces or pains- take tylenol or ibuprofen appropriate for age and weight.  * for fevers greater than 101 orally you may alternate ibuprofen and tylenol every  3 hours.   Mary-Margaret Daphine DeutscherMartin, FNP

## 2014-05-12 NOTE — Patient Instructions (Signed)
1. Take meds as prescribed 2. Use a cool mist humidifier especially during the winter months and when heat has been humid. 3. Use saline nose sprays frequently 4. Saline irrigations of the nose can be very helpful if done frequently.  * 4X daily for 1 week*  * Use of a nettie pot can be helpful with this. Follow directions with this* 5. Drink plenty of fluids 6. Keep thermostat turn down low 7.For any cough or congestion  Use plain Mucinex- regular strength or max strength is fine for cough-or nyquil   * Children- consult with Pharmacist for dosing 8. For fever or aces or pains- take tylenol or ibuprofen appropriate for age and weight.  * for fevers greater than 101 orally you may alternate ibuprofen and tylenol every  3 hours.

## 2014-05-12 NOTE — Telephone Encounter (Signed)
Runny nose, headache, watery eyes, and sneezing.  Symptoms x 1 week with worsening of symptoms this morning.  Has taken an OTC allergy medication for the past week without any relief.  He is unable to come into the office today or tomorrow for evaluation due to a funeral.  He had similar symptoms in 01/2014 and was given Amoxicillin 875 mg. This worked well for him.

## 2014-05-12 NOTE — Telephone Encounter (Signed)
Patient aware. At this time he is unavailable to come in this evening or tomorrow. Appt scheduled for Saturday clinic. Patient will call back if his schedule changes and he can come in sooner.

## 2014-05-12 NOTE — Telephone Encounter (Signed)
NTBS has not been seen since October

## 2014-05-12 NOTE — Addendum Note (Signed)
Addended by: Bearl MulberryUTHERFORD, Anthonio Mizzell K on: 05/12/2014 05:44 PM   Modules accepted: Kipp BroodSmartSet

## 2014-05-14 ENCOUNTER — Ambulatory Visit: Payer: Medicare HMO

## 2014-05-31 ENCOUNTER — Telehealth: Payer: Self-pay | Admitting: Nurse Practitioner

## 2014-05-31 NOTE — Telephone Encounter (Signed)
Appointment given for tomorrow with Tiffany.  

## 2014-06-01 ENCOUNTER — Ambulatory Visit (INDEPENDENT_AMBULATORY_CARE_PROVIDER_SITE_OTHER): Payer: Medicare HMO | Admitting: Physician Assistant

## 2014-06-01 ENCOUNTER — Encounter: Payer: Self-pay | Admitting: Physician Assistant

## 2014-06-01 VITALS — BP 140/84 | HR 82 | Temp 97.2°F | Ht 68.0 in | Wt 300.0 lb

## 2014-06-01 DIAGNOSIS — L600 Ingrowing nail: Secondary | ICD-10-CM | POA: Diagnosis not present

## 2014-06-01 NOTE — Patient Instructions (Signed)
Ingrown Toenail °An ingrown toenail occurs when the sharp edge of your toenail grows into the skin. Causes of ingrown toenails include toenails clipped too far back or poorly fitting shoes. Activities involving sudden stops (basketball, tennis) causing "toe jamming" may lead to an ingrown nail. °HOME CARE INSTRUCTIONS  °· Soak the whole foot in warm soapy water for 20 minutes, 3 times per day. °· You may lift the edge of the nail away from the sore skin by wedging a small piece of cotton under the corner of the nail. Be careful not to dig (traumatize) and cause more injury to the area. °· Wear shoes that fit well. While the ingrown nail is causing problems, sandals may be beneficial. °· Trim your toenails regularly and carefully. Cut your toenails straight across, not in a curve. This will prevent injury to the skin at the corners of the toenail. °· Keep your feet clean and dry. °· Crutches may be helpful early in treatment if walking is painful. °· Antibiotics, if prescribed, should be taken as directed. °· Return for a wound check in 2 days or as directed. °· Only take over-the-counter or prescription medicines for pain, discomfort, or fever as directed by your caregiver. °SEEK IMMEDIATE MEDICAL CARE IF:  °· You have a fever. °· You have increasing pain, redness, swelling, or heat at the wound site. °· Your toe is not better in 7 days. °If conservative treatment is not successful, surgical removal of a portion or all of the nail may be necessary. °MAKE SURE YOU:  °· Understand these instructions. °· Will watch your condition. °· Will get help right away if you are not doing well or get worse. °Document Released: 01/26/2000 Document Revised: 04/22/2011 Document Reviewed: 01/20/2008 °ExitCare® Patient Information ©2015 [employer]. This information is not intended to replace advice given to you by your health care provider. Make sure you discuss any questions you have with your health care provider. ° °Infected  Ingrown Toenail °An infected ingrown toenail occurs when the nail edge grows into the skin and bacteria invade the area. Symptoms include pain, tenderness, swelling, and pus drainage from the edge of the nail. Poorly fitting shoes, minor injuries, and improper cutting of the toenail may also contribute to the problem. You should cut your toenails squarely instead of rounding the edges. Do not cut them too short. Avoid tight or pointed toe shoes. Sometimes the ingrown portion of the nail must be removed. If your toenail is removed, it can take 3-4 months for it to re-grow. °HOME CARE INSTRUCTIONS  °· Soak your infected toe in warm water for 20-30 minutes, 2 to 3 times a day. °· Packing or dressings applied to the area should be changed daily. °· Take medicine as directed and finish them. °· Reduce activities and keep your foot elevated when able to reduce swelling and discomfort. Do this until the infection gets better. °· Wear sandals or go barefoot as much as possible while the infected area is sensitive. °· See your caregiver for follow-up care in 2-3 days if the infection is not better. °SEEK MEDICAL CARE IF:  °Your toe is becoming more red, swollen or painful. °MAKE SURE YOU:  °· Understand these instructions. °· Will watch your condition. °· Will get help right away if you are not doing well or get worse. °Document Released: 03/07/2004 Document Revised: 04/22/2011 Document Reviewed: 01/25/2008 °ExitCare® Patient Information ©2015 [employer]. This information is not intended to replace advice given to you by your health   provider. Make sure you discuss any questions you have with your health care provider.

## 2014-06-01 NOTE — Progress Notes (Signed)
   Subjective:    Patient ID: Evan Smith, male    DOB: 11/28/1955, 59 y.o.   MRN: [id number]  HPI 59 y/o male presents with left great toe pain x several weeks. Started epsom soaks yesterday which seemed to help     Review of Systems  Skin:       Ingrown toenail on left great toe       Objective:   Physical Exam  Skin: There is erythema.  Hyperkeratotic nail on great left toe. Appears to be necrotic on distal tip TTP. Cultured for infection.           Assessment & Plan:  1. Ingrown left big toenail  - Aerobic culture - will start antibiotic if needed when results are obtained - Advised f/u with podiatry - Instructions given on Epsom salt soak TID. Put small amt of cotton under nail   F/u if worsen, dni or is unable to get in with podiatry      Tiffany A. Chauncey ReadingGann PA-C

## 2014-06-03 LAB — AEROBIC CULTURE

## 2014-06-21 ENCOUNTER — Telehealth: Payer: Self-pay | Admitting: Nurse Practitioner

## 2014-06-21 NOTE — Telephone Encounter (Signed)
Stp and he states he had read he needed to have a medical bracelet or card stating he was taking gabapentin, advised pt he can just keep a current med list in his wallet. Pt voiced understanding.

## 2014-09-01 ENCOUNTER — Other Ambulatory Visit: Payer: Self-pay

## 2014-09-01 DIAGNOSIS — G629 Polyneuropathy, unspecified: Secondary | ICD-10-CM

## 2014-09-01 MED ORDER — GABAPENTIN 300 MG PO CAPS: 300.0000 mg | ORAL_CAPSULE | Freq: Three times a day (TID) | ORAL | Status: DC

## 2014-09-22 ENCOUNTER — Encounter: Payer: Self-pay | Admitting: Physician Assistant

## 2014-09-22 ENCOUNTER — Ambulatory Visit (INDEPENDENT_AMBULATORY_CARE_PROVIDER_SITE_OTHER): Payer: Medicare HMO | Admitting: Physician Assistant

## 2014-09-22 VITALS — BP 120/85 | HR 87 | Temp 96.7°F | Ht 68.0 in | Wt 301.0 lb

## 2014-09-22 DIAGNOSIS — E785 Hyperlipidemia, unspecified: Secondary | ICD-10-CM

## 2014-09-22 DIAGNOSIS — J309 Allergic rhinitis, unspecified: Secondary | ICD-10-CM

## 2014-09-22 DIAGNOSIS — I10 Essential (primary) hypertension: Secondary | ICD-10-CM

## 2014-09-22 DIAGNOSIS — K2100 Gastro-esophageal reflux disease with esophagitis, without bleeding: Secondary | ICD-10-CM

## 2014-09-22 DIAGNOSIS — G629 Polyneuropathy, unspecified: Secondary | ICD-10-CM | POA: Diagnosis not present

## 2014-09-22 DIAGNOSIS — M1990 Unspecified osteoarthritis, unspecified site: Secondary | ICD-10-CM

## 2014-09-22 MED ORDER — GABAPENTIN 300 MG PO CAPS: 300.0000 mg | ORAL_CAPSULE | Freq: Three times a day (TID) | ORAL | Status: DC

## 2014-09-22 MED ORDER — AMLODIPINE BESYLATE 5 MG PO TABS: 5.0000 mg | ORAL_TABLET | Freq: Every day | ORAL | Status: DC

## 2014-09-22 MED ORDER — CYCLOBENZAPRINE HCL 10 MG PO TABS: 10.0000 mg | ORAL_TABLET | Freq: Three times a day (TID) | ORAL | Status: DC | PRN

## 2014-09-22 MED ORDER — CETIRIZINE HCL 10 MG PO TABS: 10.0000 mg | ORAL_TABLET | Freq: Every day | ORAL | Status: DC

## 2014-09-22 MED ORDER — SIMVASTATIN 40 MG PO TABS: 40.0000 mg | ORAL_TABLET | Freq: Every day | ORAL | Status: DC

## 2014-09-22 MED ORDER — MELOXICAM 15 MG PO TABS: 15.0000 mg | ORAL_TABLET | Freq: Every day | ORAL | Status: DC

## 2014-09-22 MED ORDER — FLUTICASONE PROPIONATE 50 MCG/ACT NA SUSP: 2.0000 | Freq: Every day | NASAL | Status: DC

## 2014-09-22 MED ORDER — LOSARTAN POTASSIUM-HCTZ 100-25 MG PO TABS: ORAL_TABLET | ORAL | Status: DC

## 2014-09-22 MED ORDER — OMEPRAZOLE 40 MG PO CPDR: 40.0000 mg | DELAYED_RELEASE_CAPSULE | Freq: Every morning | ORAL | Status: DC

## 2014-09-22 NOTE — Progress Notes (Signed)
   Subjective:    Patient ID: Evan Smith, male    DOB: [birth year], 59 y.o.   MRN: [id number]  HPI 59 y/o male presents for follow up of chronic medical conditions including hyperlipidemia, htn , GERD, chronic back pain d/t 2 back surgeries. He is wanting a medication for his chronic back pain. I have suggested he follow up with his orthopaedic doctor but he refuses. I have told him that I will not prescribe ongoing narcotics for his pain.   He has been having sneezing and PND. Has been taking an otc medication with no relief    Review of Systems  Constitutional: Negative.   HENT: Positive for postnasal drip and sneezing.   Musculoskeletal: Positive for back pain (chronic back pain ).  Skin: Negative.   Neurological: Positive for numbness (right hand, 2nd and 3rd digits ).  Hematological: Negative.   Psychiatric/Behavioral: Negative.        Objective:   Physical Exam  Constitutional: He is oriented to person, place, and time. He appears well-developed and well-nourished. No distress.  HENT:  Head: Normocephalic.  Cardiovascular: Normal rate, regular rhythm and normal heart sounds.  Exam reveals no gallop and no friction rub.   No murmur heard. Pulmonary/Chest: Effort normal and breath sounds normal. No respiratory distress. He has no wheezes. He has no rales. He exhibits no tenderness.  Abdominal:  Morbidly obese   Musculoskeletal: Normal range of motion. He exhibits no edema.  Neurological: He is alert and oriented to person, place, and time.  Skin: He is not diaphoretic.  Psychiatric: He has a normal mood and affect. His behavior is normal. Judgment and thought content normal.  Nursing note and vitals reviewed.         Assessment & Plan:  1. Essential hypertension, benign  - amLODipine (NORVASC) 5 MG tablet; Take 1 tablet (5 mg total) by mouth daily.  Dispense: 90 tablet; Refill: 1 - losartan-hydrochlorothiazide (HYZAAR) 100-25 MG per tablet; TAKE ONE TABLET BY  MOUTH ONCE DAILY  Dispense: 90 tablet; Refill: 1 - POCT CBC - Thyroid Panel With TSH - CMP14+EGFR  2. Gastroesophageal reflux disease with esophagitis  - omeprazole (PRILOSEC) 40 MG capsule; Take 1 capsule (40 mg total) by mouth every morning.  Dispense: 90 capsule; Refill: 1  3. Hyperlipemia  - simvastatin (ZOCOR) 40 MG tablet; Take 1 tablet (40 mg total) by mouth at bedtime.  Dispense: 90 tablet; Refill: 1 - Lipid panel  4. Neuropathy -= Suggest follow up with ortho surgery for further evaluation of back pain - patient refuses.  - gabapentin (NEURONTIN) 300 MG capsule; Take 1 capsule (300 mg total) by mouth 3 (three) times daily.  Dispense: 90 capsule; Refill: 0  5. Arthritis  - meloxicam (MOBIC) 15 MG tablet; Take 1 tablet (15 mg total) by mouth daily.  Dispense: 90 tablet; Refill: 0  6. Allergic Rhinitis  - Flonase as directed - Zyrtec 10 mg qhs   Continue all meds Labs pending Health Maintenance reviewed Diet and exercise encouraged RTO 6 months   Efton Thomley A. Benjamin Stain PA-C

## 2014-09-23 LAB — CBC WITH DIFFERENTIAL/PLATELET
BASOS ABS: 0 10*3/uL (ref 0.0–0.2)
BASOS: 1 %
EOS (ABSOLUTE): 0.1 10*3/uL (ref 0.0–0.4)
EOS: 3 %
HEMATOCRIT: 41.5 % (ref 37.5–51.0)
HEMOGLOBIN: 14 g/dL (ref 12.6–17.7)
IMMATURE GRANULOCYTES: 0 %
Immature Grans (Abs): 0 10*3/uL (ref 0.0–0.1)
LYMPHS: 35 %
Lymphocytes Absolute: 1.3 10*3/uL (ref 0.7–3.1)
MCH: 31.6 pg (ref 26.6–33.0)
MCHC: 33.7 g/dL (ref 31.5–35.7)
MCV: 94 fL (ref 79–97)
Monocytes Absolute: 0.4 10*3/uL (ref 0.1–0.9)
Monocytes: 11 %
NEUTROS PCT: 50 %
Neutrophils Absolute: 1.9 10*3/uL (ref 1.4–7.0)
Platelets: 193 10*3/uL (ref 150–379)
RBC: 4.43 x10E6/uL (ref 4.14–5.80)
RDW: 14.1 % (ref 12.3–15.4)
WBC: 3.7 10*3/uL (ref 3.4–10.8)

## 2014-09-23 LAB — THYROID PANEL WITH TSH
FREE THYROXINE INDEX: 2.1 (ref 1.2–4.9)
T3 UPTAKE RATIO: 30 % (ref 24–39)
T4, Total: 7 ug/dL (ref 4.5–12.0)
TSH: 3.43 u[IU]/mL (ref 0.450–4.500)

## 2014-09-23 LAB — CMP14+EGFR
A/G RATIO: 1.7 (ref 1.1–2.5)
ALT: 32 IU/L (ref 0–44)
AST: 29 IU/L (ref 0–40)
Albumin: 4.4 g/dL (ref 3.6–4.8)
Alkaline Phosphatase: 57 IU/L (ref 39–117)
BILIRUBIN TOTAL: 0.5 mg/dL (ref 0.0–1.2)
BUN / CREAT RATIO: 17 (ref 10–22)
BUN: 23 mg/dL (ref 8–27)
CALCIUM: 9.4 mg/dL (ref 8.6–10.2)
CO2: 24 mmol/L (ref 18–29)
CREATININE: 1.32 mg/dL — AB (ref 0.76–1.27)
Chloride: 97 mmol/L (ref 97–108)
GFR, EST AFRICAN AMERICAN: 67 mL/min/{1.73_m2} (ref >59)
GFR, EST NON AFRICAN AMERICAN: 58 mL/min/{1.73_m2} — AB (ref >59)
GLOBULIN, TOTAL: 2.6 g/dL (ref 1.5–4.5)
Glucose: 101 mg/dL — ABNORMAL HIGH (ref 65–99)
Potassium: 4.3 mmol/L (ref 3.5–5.2)
SODIUM: 138 mmol/L (ref 134–144)
Total Protein: 7 g/dL (ref 6.0–8.5)

## 2014-09-23 LAB — LIPID PANEL
CHOL/HDL RATIO: 3 ratio (ref 0.0–5.0)
Cholesterol, Total: 149 mg/dL (ref 100–199)
HDL: 50 mg/dL (ref >39)
LDL CALC: 73 mg/dL (ref 0–99)
TRIGLYCERIDES: 131 mg/dL (ref 0–149)
VLDL CHOLESTEROL CAL: 26 mg/dL (ref 5–40)

## 2014-11-23 DIAGNOSIS — G4733 Obstructive sleep apnea (adult) (pediatric): Secondary | ICD-10-CM | POA: Diagnosis not present

## 2014-11-23 DIAGNOSIS — I10 Essential (primary) hypertension: Secondary | ICD-10-CM | POA: Diagnosis not present

## 2014-11-23 DIAGNOSIS — M549 Dorsalgia, unspecified: Secondary | ICD-10-CM | POA: Diagnosis not present

## 2014-11-30 ENCOUNTER — Telehealth: Payer: Self-pay | Admitting: Family Medicine

## 2014-11-30 NOTE — Telephone Encounter (Signed)
Patient advised he should be seen.

## 2014-11-30 NOTE — Telephone Encounter (Signed)
Please schedule patient for an appointment for tomorrow with one of the new providers

## 2014-12-30 ENCOUNTER — Encounter: Payer: Self-pay | Admitting: *Deleted

## 2015-01-06 ENCOUNTER — Other Ambulatory Visit: Payer: Self-pay | Admitting: Physician Assistant

## 2015-01-13 ENCOUNTER — Other Ambulatory Visit: Payer: Self-pay | Admitting: Family Medicine

## 2015-01-13 ENCOUNTER — Ambulatory Visit (INDEPENDENT_AMBULATORY_CARE_PROVIDER_SITE_OTHER): Payer: Medicare HMO | Admitting: *Deleted

## 2015-01-13 DIAGNOSIS — K2100 Gastro-esophageal reflux disease with esophagitis, without bleeding: Secondary | ICD-10-CM

## 2015-01-13 DIAGNOSIS — Z23 Encounter for immunization: Secondary | ICD-10-CM | POA: Diagnosis not present

## 2015-01-13 DIAGNOSIS — G629 Polyneuropathy, unspecified: Secondary | ICD-10-CM

## 2015-01-13 DIAGNOSIS — I10 Essential (primary) hypertension: Secondary | ICD-10-CM

## 2015-01-13 MED ORDER — LOSARTAN POTASSIUM-HCTZ 100-25 MG PO TABS: ORAL_TABLET | ORAL | Status: DC

## 2015-01-13 MED ORDER — OMEPRAZOLE 40 MG PO CPDR: 40.0000 mg | DELAYED_RELEASE_CAPSULE | Freq: Every morning | ORAL | Status: DC

## 2015-01-13 MED ORDER — GABAPENTIN 300 MG PO CAPS: 300.0000 mg | ORAL_CAPSULE | Freq: Three times a day (TID) | ORAL | Status: DC

## 2015-01-13 NOTE — Telephone Encounter (Signed)
done

## 2015-03-09 ENCOUNTER — Ambulatory Visit (INDEPENDENT_AMBULATORY_CARE_PROVIDER_SITE_OTHER): Payer: Medicare HMO | Admitting: Pharmacist

## 2015-03-09 ENCOUNTER — Other Ambulatory Visit: Payer: Self-pay | Admitting: Pharmacist

## 2015-03-09 ENCOUNTER — Encounter: Payer: Self-pay | Admitting: Pharmacist

## 2015-03-09 VITALS — BP 120/74 | HR 64 | Ht 68.0 in | Wt 308.0 lb

## 2015-03-09 DIAGNOSIS — Z0000 Encounter for general adult medical examination without abnormal findings: Secondary | ICD-10-CM | POA: Diagnosis not present

## 2015-03-09 DIAGNOSIS — I10 Essential (primary) hypertension: Secondary | ICD-10-CM | POA: Diagnosis not present

## 2015-03-09 DIAGNOSIS — Z1159 Encounter for screening for other viral diseases: Secondary | ICD-10-CM | POA: Diagnosis not present

## 2015-03-09 DIAGNOSIS — E785 Hyperlipidemia, unspecified: Secondary | ICD-10-CM

## 2015-03-09 DIAGNOSIS — Z114 Encounter for screening for human immunodeficiency virus [HIV]: Secondary | ICD-10-CM

## 2015-03-09 DIAGNOSIS — Z125 Encounter for screening for malignant neoplasm of prostate: Secondary | ICD-10-CM | POA: Diagnosis not present

## 2015-03-09 DIAGNOSIS — E119 Type 2 diabetes mellitus without complications: Secondary | ICD-10-CM | POA: Insufficient documentation

## 2015-03-09 DIAGNOSIS — R69 Illness, unspecified: Secondary | ICD-10-CM | POA: Diagnosis not present

## 2015-03-09 DIAGNOSIS — E8881 Metabolic syndrome: Secondary | ICD-10-CM

## 2015-03-09 MED ORDER — CYCLOBENZAPRINE HCL 10 MG PO TABS: 10.0000 mg | ORAL_TABLET | Freq: Three times a day (TID) | ORAL | Status: DC | PRN

## 2015-03-09 MED ORDER — MELOXICAM 15 MG PO TABS: 15.0000 mg | ORAL_TABLET | Freq: Every day | ORAL | Status: DC

## 2015-03-09 NOTE — Telephone Encounter (Signed)
I sent the Rx to the pharmacy.

## 2015-03-09 NOTE — Telephone Encounter (Signed)
Detailed message left for patient that rx sent to pharmacy. 

## 2015-03-09 NOTE — Patient Instructions (Addendum)
Evan Smith , Thank you for taking time to come for your Medicare Wellness Visit. I appreciate your ongoing commitment to your health goals. Please review the following plan we discussed and let me know if I can assist you in the future.   These are the goals we discussed:  Increase non-starchy vegetables - carrots, green bean, squash, zucchini, tomatoes, onions, peppers, spinach and other green leafy vegetables, cabbage, lettuce, cucumbers, asparagus, okra (not fried), eggplant limit sugar and processed foods (cakes, cookies, ice cream, crackers and chips) Increase fresh fruit but limit serving sizes 1/2 cup or about the size of tennis or baseball limit red meat to no more than 1-2 times per week (serving size about the size of your palm) Choose whole grains / lean proteins - whole wheat bread, quinoa, whole grain rice (1/2 cup), fish, chicken, [place]  Increase physical activity as able - recommend walking 5 minutes and build up as able.  Start Chair exercises from handout given.  Consider either Silver Sneakers or physical therapy (we can do referral is you want)   This is a list of the screening recommended for you and due dates:  Health Maintenance  Topic Date Due  .  Hepatitis C: One time screening is recommended by Center for Disease Control  (CDC) for  adults born from [birth year] through 1966.   Done today  . HIV Screening  Done today  . Shingles Vaccine  Cost was $242 - will recheck later  . Flu Shot  09/12/2015  . Colon Cancer Screening  03/21/2016  . Tetanus Vaccine  05/26/2016   Health Maintenance, Male A healthy lifestyle and preventative care can promote health and wellness.  Maintain regular health, dental, and eye exams.  Eat a healthy diet. Foods like vegetables, fruits, whole grains, low-fat dairy products, and lean protein foods contain the nutrients you need and are low in calories. Decrease your intake of foods high in solid fats, added sugars, and salt. Get information  about a proper diet from your health care provider, if necessary.  Regular physical exercise is one of the most important things you can do for your health. Most adults should get at least 150 minutes of moderate-intensity exercise (any activity that increases your heart rate and causes you to sweat) each week. In addition, most adults need muscle-strengthening exercises on 2 or more days a week.   Maintain a healthy weight. The body mass index (BMI) is a screening tool to identify possible weight problems. It provides an estimate of body fat based on height and weight. Your health care provider can find your BMI and can help you achieve or maintain a healthy weight. For males 20 years and older:  A BMI below 18.5 is considered underweight.  A BMI of 18.5 to 24.9 is normal.  A BMI of 25 to 29.9 is considered overweight.  A BMI of 30 and above is considered obese.  Maintain normal blood lipids and cholesterol by exercising and minimizing your intake of saturated fat. Eat a balanced diet with plenty of fruits and vegetables. Blood tests for lipids and cholesterol should begin at age 93 and be repeated every 5 years. If your lipid or cholesterol levels are high, you are over age 48, or you are at high risk for heart disease, you may need your cholesterol levels checked more frequently.Ongoing high lipid and cholesterol levels should be treated with medicines if diet and exercise are not working.  If you smoke, find out from your health  care provider how to quit. If you do not use tobacco, do not start.  Lung cancer screening is recommended for adults aged 55-80 years who are at high risk for developing lung cancer because of a history of smoking. A yearly low-dose CT scan of the lungs is recommended for people who have at least a 30-pack-year history of smoking and are current smokers or have quit within the past 15 years. A pack year of smoking is smoking an average of 1 pack of cigarettes a day  for 1 year (for example, a 30-pack-year history of smoking could mean smoking 1 pack a day for 30 years or 2 packs a day for 15 years). Yearly screening should continue until the smoker has stopped smoking for at least 15 years. Yearly screening should be stopped for people who develop a health problem that would prevent them from having lung cancer treatment.  If you choose to drink alcohol, do not have more than 2 drinks per day. One drink is considered to be 12 oz (360 mL) of beer, 5 oz (150 mL) of wine, or 1.5 oz (45 mL) of liquor.  Avoid the use of street drugs. Do not share needles with anyone. Ask for help if you need support or instructions about stopping the use of drugs.  High blood pressure causes heart disease and increases the risk of stroke. High blood pressure is more likely to develop in:  People who have blood pressure in the end of the normal range (100-139/85-89 mm Hg).  People who are overweight or obese.  People who are African American.  If you are 33-59 years of age, have your blood pressure checked every 3-5 years. If you are 39 years of age or older, have your blood pressure checked every year. You should have your blood pressure measured twice--once when you are at a hospital or clinic, and once when you are not at a hospital or clinic. Record the average of the two measurements. To check your blood pressure when you are not at a hospital or clinic, you can use:  An automated blood pressure machine at a pharmacy.  A home blood pressure monitor.  If you are 1-34 years old, ask your health care provider if you should take aspirin to prevent heart disease.  Diabetes screening involves taking a blood sample to check your fasting blood sugar level. This should be done once every 3 years after age 43 if you are at a normal weight and without risk factors for diabetes. Testing should be considered at a younger age or be carried out more frequently if you are overweight and  have at least 1 risk factor for diabetes.  Colorectal cancer can be detected and often prevented. Most routine colorectal cancer screening begins at the age of 25 and continues through age 72. However, your health care provider may recommend screening at an earlier age if you have risk factors for colon cancer. On a yearly basis, your health care provider may provide home test kits to check for hidden blood in the stool. A small camera at the end of a tube may be used to directly examine the colon (sigmoidoscopy or colonoscopy) to detect the earliest forms of colorectal cancer. Talk to your health care provider about this at age 67 when routine screening begins. A direct exam of the colon should be repeated every 5-10 years through age 61, unless early forms of precancerous polyps or small growths are found.  People who are at  an increased risk for hepatitis B should be screened for this virus. You are considered at high risk for hepatitis B if:  You were born in a country where hepatitis B occurs often. Talk with your health care provider about which countries are considered high risk.  Your parents were born in a high-risk country and you have not received a shot to protect against hepatitis B (hepatitis B vaccine).  You have HIV or AIDS.  You use needles to inject street drugs.  You live with, or have sex with, someone who has hepatitis B.  You are a man who has sex with other men (MSM).  You get hemodialysis treatment.  You take certain medicines for conditions like cancer, organ transplantation, and autoimmune conditions.  Hepatitis C blood testing is recommended for all people born from [birth year] through 1965 and any individual with known risk factors for hepatitis C.  Healthy men should no longer receive prostate-specific antigen (PSA) blood tests as part of routine cancer screening. Talk to your health care provider about prostate cancer screening.  Testicular cancer screening is not  recommended for adolescents or adult males who have no symptoms. Screening includes self-exam, a health care provider exam, and other screening tests. Consult with your health care provider about any symptoms you have or any concerns you have about testicular cancer.  Practice safe sex. Use condoms and avoid high-risk sexual practices to reduce the spread of sexually transmitted infections (STIs).  You should be screened for STIs, including gonorrhea and chlamydia if:  You are sexually active and are younger than 24 years.  You are older than 24 years, and your health care provider tells you that you are at risk for this type of infection.  Your sexual activity has changed since you were last screened, and you are at an increased risk for chlamydia or gonorrhea. Ask your health care provider if you are at risk.  If you are at risk of being infected with HIV, it is recommended that you take a prescription medicine daily to prevent HIV infection. This is called pre-exposure prophylaxis (PrEP). You are considered at risk if:  You are a man who has sex with other men (MSM).  You are a heterosexual man who is sexually active with multiple partners.  You take drugs by injection.  You are sexually active with a partner who has HIV.  Talk with your health care provider about whether you are at high risk of being infected with HIV. If you choose to begin PrEP, you should first be tested for HIV. You should then be tested every 3 months for as long as you are taking PrEP.  Use sunscreen. Apply sunscreen liberally and repeatedly throughout the day. You should seek shade when your shadow is shorter than you. Protect yourself by wearing long sleeves, pants, a wide-brimmed hat, and sunglasses year round whenever you are outdoors.  Tell your health care provider of new moles or changes in moles, especially if there is a change in shape or color. Also, tell your health care provider if a mole is larger  than the size of a pencil eraser.  A one-time screening for abdominal aortic aneurysm (AAA) and surgical repair of large AAAs by ultrasound is recommended for men aged 65-75 years who are current or former smokers.  Stay current with your vaccines (immunizations).   This information is not intended to replace advice given to you by your health care provider. Make sure you discuss any questions you  have with your health care provider.   Document Released: 07/27/2007 Document Revised: 02/18/2014 Document Reviewed: 06/25/2010 Elsevier Interactive Patient Education [employer]

## 2015-03-09 NOTE — Progress Notes (Signed)
Patient ID: PADEN Smith, male   DOB: 1956/01/29, 60 y.o.   MRN: [id number]    Subjective:   Evan Smith is a 60 y.o. black, male who presents for an Initial Medicare Annual Wellness Visit. Evan Smith is married and lives with his wife in [place], [place]. He is pleasant and cooperative today.  He states that his biggest health concern is neuropathy in this right hand and back pain. Evan Smith has had 2 back surgeries - first in 1990's and again in 2010.  They both relieved pain a little.  Review of Systems  Review of Systems  Constitutional: Negative.   HENT: Negative.   Eyes: Negative.   Respiratory: Negative.   Cardiovascular: Negative.   Gastrointestinal: Negative.   Genitourinary: Negative.   Musculoskeletal: Positive for back pain and joint pain. Negative for myalgias and falls.  Skin: Negative.   Neurological: Negative.   Endo/Heme/Allergies: Negative.   Psychiatric/Behavioral: Negative.       Current Medications (verified) Outpatient Encounter Prescriptions as of 03/09/2015  Medication Sig  . amLODipine (NORVASC) 5 MG tablet Take 1 tablet (5 mg total) by mouth daily.  Marland Kitchen aspirin 81 MG tablet Take 81 mg by mouth daily.  . cetirizine (ZYRTEC) 10 MG tablet Take 1 tablet (10 mg total) by mouth daily.  . Cholecalciferol (VITAMIN D3) 1000 UNITS CAPS Take 2 capsules by mouth daily.   Marland Kitchen docusate sodium (COLACE) 100 MG capsule Take 200 mg by mouth daily as needed for mild constipation.  . gabapentin (NEURONTIN) 300 MG capsule Take 1 capsule (300 mg total) by mouth 3 (three) times daily. (Patient taking differently: Take 300 mg by mouth at bedtime. )  . ibuprofen (ADVIL,MOTRIN) 200 MG tablet Take 200 mg by mouth 2 (two) times daily as needed.  Marland Kitchen losartan-hydrochlorothiazide (HYZAAR) 100-25 MG tablet TAKE ONE TABLET BY MOUTH ONCE DAILY  . omeprazole (PRILOSEC) 40 MG capsule Take 1 capsule (40 mg total) by mouth every morning.  . Potassium Gluconate 80 MG TABS Take 99 mg by  mouth.  . simvastatin (ZOCOR) 40 MG tablet Take 1 tablet (40 mg total) by mouth at bedtime.  . fluticasone (FLONASE) 50 MCG/ACT nasal spray Place 2 sprays into both nostrils daily. (Patient not taking: Reported on 03/09/2015)  . [DISCONTINUED] cyclobenzaprine (FLEXERIL) 10 MG tablet Take 1 tablet (10 mg total) by mouth 3 (three) times daily as needed for muscle spasms. (Patient not taking: Reported on 03/09/2015)  . [DISCONTINUED] meloxicam (MOBIC) 15 MG tablet TAKE ONE TABLET BY MOUTH ONCE DAILY (Patient not taking: Reported on 03/09/2015)   No facility-administered encounter medications on file as of 03/09/2015.    Allergies (verified) Hydrocodeine   History: Past Medical History  Diagnosis Date  . Hypertension   . OSA on CPAP     MODERATE OSA PER STUDY 2010  . Perianal fistula   . PONV (postoperative nausea and vomiting)     and hear to wake  . GERD (gastroesophageal reflux disease)   . Arthritis   . DDD (degenerative disc disease)   . Hyperlipidemia    Past Surgical History  Procedure Laterality Date  . Shoulder arthroscopy with rotator cuff repair Left 1990's  . Incision and drainage perirectal abscess  02/13/2012    Procedure: IRRIGATION AND DEBRIDEMENT PERIRECTAL ABSCESS;  Surgeon: Earnstine Regal, MD;  Location: [facility];  Service: General;  Laterality: N/A;  . Right hydrocelectomy  07-28-2001  . Lumbar fusion  2010  . Cervical fusion  1990's  . Evaluation  under anesthesia with anal fistulectomy N/A 08/03/2012    Procedure: EXAM UNDER ANESTHESIA WITH ANAL FISTULECTOMY ;  Surgeon: Leighton Ruff, MD;  Location: [facility];  Service: General;  Laterality: N/A;   Family History  Problem Relation Age of Onset  . Brain cancer Mother   . Diabetes Mother   . Kidney Stones Father   . Diabetes Sister   . Heart disease Sister   . Lymphoma Brother   . Heart disease Brother    Social History   Occupational History  . Not on file.   Social History Main Topics  .  Smoking status: Former Smoker -- 0.50 packs/day for 25 years    Types: Cigarettes    Quit date: 02/12/2004  . Smokeless tobacco: Never Used  . Alcohol Use: Yes     Comment: occassional  . Drug Use: No  . Sexual Activity: Yes    Do you feel safe at home?  Yes  Dietary issues and exercise activities discussed: Current Exercise Habits:: Exercise is limited by, Limited by:: orthopedic condition(s);neurologic condition(s)  Current Dietary habits:  Breakfast - 2 pieces of sausage, Bran flakes cereal with lactose free milk (whole) Lunch - usually only snacks on fruit cup Supper - baked pork chops or other meat;  Salad + other vegetables; pinto beans with cornbread.  Beverages - water; soda at night (pepsi)  Cardiac Risk Factors include: advanced age (>36mn, >>84women);dyslipidemia;hypertension;male gender;obesity (BMI >30kg/m2);sedentary lifestyle  Objective:    Today's Vitals   03/09/15 0842  BP: 120/74  Pulse: 64  Height: _0  (1.727 m)  Weight: 308 lb (139.708 kg)  PainSc: 3   PainLoc: Back   Body mass index is 46.84 kg/(m^2).   Activities of Daily Living In your present state of health, do you have any difficulty performing the following activities: 03/09/2015  Hearing? N  Vision? N  Difficulty concentrating or making decisions? N  Walking or climbing stairs? Y  Dressing or bathing? N  Doing errands, shopping? N  Preparing Food and eating ? N  Using the Toilet? N  In the past six months, have you accidently leaked urine? N  Do you have problems with loss of bowel control? N  Managing your Medications? N  Managing your Finances? N  Housekeeping or managing your Housekeeping? N    Are there smokers in your home (other than you)? No    Depression Screen PHQ 2/9 Scores 03/09/2015 01/19/2014 11/24/2013 04/12/2013  PHQ - 2 Score 0 0 0 0    Fall Risk Fall Risk  03/09/2015 01/19/2014 11/24/2013 04/12/2013  Falls in the past year? No Yes Yes No  Number falls in past yr: -  1 1 -  Injury with Fall? - No No -    Cognitive Function: MMSE - Mini Mental State Exam 03/09/2015  Orientation to time 5  Orientation to Place 5  Registration 3  Attention/ Calculation 4  Recall 3  Language- name 2 objects 2  Language- repeat 1  Language- follow 3 step command 3  Language- read & follow direction 1  Write a sentence 1  Copy design 1  Total score 29    Immunizations and Health Maintenance Immunization History  Administered Date(s) Administered  . Influenza,inj,Quad PF,36+ Mos 11/24/2013, 01/13/2015   Health Maintenance Due  Topic Date Due  . Hepatitis C Screening  01956-04-09 . HIV Screening  05/18/1969  . ZOSTAVAX  05/19/2014    Patient Care Team: MChevis Pretty FNP as PCP -  General (Family Medicine) Armandina Gemma, MD as Consulting Physician (General Surgery)  Indicate any recent [place] you may have received from other than [facility] providers in the past year (date may be approximate).    Assessment:    Annual Wellness Visit  Back / Joint pain   Screening Tests Health Maintenance  Topic Date Due  . Hepatitis C Screening  1955/06/27  . HIV Screening  05/18/1969  . ZOSTAVAX  05/19/2014  . INFLUENZA VACCINE  09/12/2015  . COLONOSCOPY  03/21/2016  . TETANUS/TDAP  05/26/2016        Plan:   During the course of the visit Lathan was educated and counseled about the following appropriate screening and preventive services:   Influenza is UTD;  TDap (cost $60.99), Zostavax ($242.00) are due but patient declined due to cost  Colorectal cancer screening - colonoscopy is UTD; FOBT was given in office today.  Cardiovascular disease screening - EKG last 2014; lipids at goal and BP at goal today  Diabetes screening - lat FBG was 101 - we are rechecking today - pending  Nutrition counseling - discussed limiting calories and serving sizes to help decrease weight.  Goal to decrease BMI by 1 point over next 3 months.   Increase gabapentin  from 384m qhs to bid for next week,  Then increase to tid.    Patient to follow up with PCP for pain in 2 weeks.   Patient advised to try Chair exercises.  Also suggested physical therapy or Silver sneakers (but not until back pain checked out)  Orders Placed This Encounter  Procedures  . CMP14+EGFR  . Lipid panel  . HIV antibody  . Hepatitis C antibody  . PSA, total and free    Patient Instructions (the written plan) were given to the patient.   ECherre Robins P[facility]  03/09/2015

## 2015-03-10 LAB — CMP14+EGFR
A/G RATIO: 1.8 (ref 1.1–2.5)
ALBUMIN: 4.5 g/dL (ref 3.6–4.8)
ALT: 27 IU/L (ref 0–44)
AST: 23 IU/L (ref 0–40)
Alkaline Phosphatase: 58 IU/L (ref 39–117)
BILIRUBIN TOTAL: 0.3 mg/dL (ref 0.0–1.2)
BUN / CREAT RATIO: 13 (ref 10–22)
BUN: 16 mg/dL (ref 8–27)
CALCIUM: 9.7 mg/dL (ref 8.6–10.2)
CHLORIDE: 98 mmol/L (ref 96–106)
CO2: 23 mmol/L (ref 18–29)
Creatinine, Ser: 1.23 mg/dL (ref 0.76–1.27)
GFR, EST AFRICAN AMERICAN: 73 mL/min/{1.73_m2} (ref >59)
GFR, EST NON AFRICAN AMERICAN: 63 mL/min/{1.73_m2} (ref >59)
GLOBULIN, TOTAL: 2.5 g/dL (ref 1.5–4.5)
Glucose: 107 mg/dL — ABNORMAL HIGH (ref 65–99)
POTASSIUM: 4.7 mmol/L (ref 3.5–5.2)
SODIUM: 138 mmol/L (ref 134–144)
TOTAL PROTEIN: 7 g/dL (ref 6.0–8.5)

## 2015-03-10 LAB — PSA, TOTAL AND FREE
PROSTATE SPECIFIC AG, SERUM: 0.5 ng/mL (ref 0.0–4.0)
PSA FREE PCT: 30 %
PSA FREE: 0.15 ng/mL

## 2015-03-10 LAB — LIPID PANEL
CHOL/HDL RATIO: 3.2 ratio (ref 0.0–5.0)
Cholesterol, Total: 158 mg/dL (ref 100–199)
HDL: 49 mg/dL (ref >39)
LDL Calculated: 78 mg/dL (ref 0–99)
Triglycerides: 155 mg/dL — ABNORMAL HIGH (ref 0–149)
VLDL Cholesterol Cal: 31 mg/dL (ref 5–40)

## 2015-03-10 LAB — HEPATITIS C ANTIBODY

## 2015-03-10 LAB — HIV ANTIBODY (ROUTINE TESTING W REFLEX): HIV SCREEN 4TH GENERATION: NONREACTIVE

## 2015-03-15 DIAGNOSIS — G4733 Obstructive sleep apnea (adult) (pediatric): Secondary | ICD-10-CM | POA: Diagnosis not present

## 2015-03-15 LAB — SPECIMEN STATUS REPORT

## 2015-03-23 DIAGNOSIS — M549 Dorsalgia, unspecified: Secondary | ICD-10-CM | POA: Diagnosis not present

## 2015-03-23 DIAGNOSIS — G4733 Obstructive sleep apnea (adult) (pediatric): Secondary | ICD-10-CM | POA: Diagnosis not present

## 2015-03-23 DIAGNOSIS — I10 Essential (primary) hypertension: Secondary | ICD-10-CM | POA: Diagnosis not present

## 2015-03-24 ENCOUNTER — Encounter: Payer: Self-pay | Admitting: Nurse Practitioner

## 2015-03-24 ENCOUNTER — Ambulatory Visit: Payer: Medicare HMO | Admitting: Physician Assistant

## 2015-03-24 ENCOUNTER — Ambulatory Visit (INDEPENDENT_AMBULATORY_CARE_PROVIDER_SITE_OTHER): Payer: Medicare HMO | Admitting: Nurse Practitioner

## 2015-03-24 ENCOUNTER — Ambulatory Visit (INDEPENDENT_AMBULATORY_CARE_PROVIDER_SITE_OTHER): Payer: Medicare HMO

## 2015-03-24 DIAGNOSIS — K2100 Gastro-esophageal reflux disease with esophagitis, without bleeding: Secondary | ICD-10-CM

## 2015-03-24 DIAGNOSIS — G629 Polyneuropathy, unspecified: Secondary | ICD-10-CM | POA: Insufficient documentation

## 2015-03-24 DIAGNOSIS — Z1212 Encounter for screening for malignant neoplasm of rectum: Secondary | ICD-10-CM | POA: Diagnosis not present

## 2015-03-24 DIAGNOSIS — Z720 Tobacco use: Secondary | ICD-10-CM

## 2015-03-24 DIAGNOSIS — E785 Hyperlipidemia, unspecified: Secondary | ICD-10-CM

## 2015-03-24 DIAGNOSIS — Z87891 Personal history of nicotine dependence: Secondary | ICD-10-CM

## 2015-03-24 DIAGNOSIS — E8881 Metabolic syndrome: Secondary | ICD-10-CM | POA: Diagnosis not present

## 2015-03-24 DIAGNOSIS — I10 Essential (primary) hypertension: Secondary | ICD-10-CM

## 2015-03-24 DIAGNOSIS — J309 Allergic rhinitis, unspecified: Secondary | ICD-10-CM | POA: Diagnosis not present

## 2015-03-24 DIAGNOSIS — G4733 Obstructive sleep apnea (adult) (pediatric): Secondary | ICD-10-CM | POA: Diagnosis not present

## 2015-03-24 LAB — POCT GLYCOSYLATED HEMOGLOBIN (HGB A1C): HEMOGLOBIN A1C: 6.1

## 2015-03-24 MED ORDER — SIMVASTATIN 40 MG PO TABS: 40.0000 mg | ORAL_TABLET | Freq: Every day | ORAL | Status: DC

## 2015-03-24 MED ORDER — OMEPRAZOLE 40 MG PO CPDR: 40.0000 mg | DELAYED_RELEASE_CAPSULE | Freq: Every morning | ORAL | Status: DC

## 2015-03-24 MED ORDER — MELOXICAM 15 MG PO TABS: 15.0000 mg | ORAL_TABLET | Freq: Every day | ORAL | Status: DC

## 2015-03-24 MED ORDER — GABAPENTIN 300 MG PO CAPS: 300.0000 mg | ORAL_CAPSULE | Freq: Every day | ORAL | Status: DC

## 2015-03-24 MED ORDER — AMLODIPINE BESYLATE 5 MG PO TABS: 5.0000 mg | ORAL_TABLET | Freq: Every day | ORAL | Status: DC

## 2015-03-24 MED ORDER — LOSARTAN POTASSIUM-HCTZ 100-25 MG PO TABS: ORAL_TABLET | ORAL | Status: DC

## 2015-03-24 NOTE — Progress Notes (Signed)
Subjective:    Patient ID: Evan Smith, male    DOB: 1955-05-14, 60 y.o.   MRN: [id number]   Patient here today for follow up of chronic medical problems.  Outpatient Encounter Prescriptions as of 03/24/2015  Medication Sig  . amLODipine (NORVASC) 5 MG tablet Take 1 tablet (5 mg total) by mouth daily.  Marland Kitchen aspirin 81 MG tablet Take 81 mg by mouth daily.  . Cholecalciferol (VITAMIN D3) 1000 UNITS CAPS Take 2 capsules by mouth daily.   . cyclobenzaprine (FLEXERIL) 10 MG tablet Take 1 tablet (10 mg total) by mouth 3 (three) times daily as needed for muscle spasms.  Marland Kitchen docusate sodium (COLACE) 100 MG capsule Take 200 mg by mouth daily as needed for mild constipation.  . fluticasone (FLONASE) 50 MCG/ACT nasal spray Place 2 sprays into both nostrils daily.  Marland Kitchen gabapentin (NEURONTIN) 300 MG capsule Take 1 capsule (300 mg total) by mouth 3 (three) times daily. (Patient taking differently: Take 300 mg by mouth at bedtime. )  . ibuprofen (ADVIL,MOTRIN) 200 MG tablet Take 200 mg by mouth 2 (two) times daily as needed.  Marland Kitchen losartan-hydrochlorothiazide (HYZAAR) 100-25 MG tablet TAKE ONE TABLET BY MOUTH ONCE DAILY  . meloxicam (MOBIC) 15 MG tablet Take 1 tablet (15 mg total) by mouth daily.  Marland Kitchen omeprazole (PRILOSEC) 40 MG capsule Take 1 capsule (40 mg total) by mouth every morning.  . Potassium Gluconate 80 MG TABS Take 99 mg by mouth.  . simvastatin (ZOCOR) 40 MG tablet Take 1 tablet (40 mg total) by mouth at bedtime.  . cetirizine (ZYRTEC) 10 MG tablet Take 1 tablet (10 mg total) by mouth daily. (Patient not taking: Reported on 03/24/2015)   No facility-administered encounter medications on file as of 03/24/2015.     Hypertension This is a chronic problem. The current episode started more than 1 year ago. The problem is controlled. Pertinent negatives include no chest pain. Risk factors for coronary artery disease include dyslipidemia and obesity. Past treatments include angiotensin blockers and  diuretics. The current treatment provides moderate improvement. Compliance problems include diet and exercise.  There is no history of CAD/MI or CVA.  Hyperlipidemia This is a chronic problem. The current episode started more than 1 year ago. The problem is resistant. Recent lipid tests were reviewed and are variable. Pertinent negatives include no chest pain or myalgias. Current antihyperlipidemic treatment includes statins. The current treatment provides moderate improvement of lipids. Compliance problems include adherence to diet and adherence to exercise.  Risk factors for coronary artery disease include dyslipidemia, male sex and obesity.  GERD Patient currently on omeprazole which works well for symptoms Neuropathy of shoulders and back Neurotin - does not take everyday- only takes couple of times a week. ALso takes mobis daily for neck pain. Metabolic syndrome Blood sugars have been slightly elevated the last several times labs have been checked. But his hbga1c was normal.   Review of Systems  Constitutional: Negative.   HENT: Negative.   Respiratory: Negative.   Cardiovascular: Negative for chest pain.  Gastrointestinal: Negative.   Genitourinary: Negative.   Musculoskeletal: Negative for myalgias.  Neurological: Negative.   Psychiatric/Behavioral: Negative.        Objective:   Physical Exam  Constitutional: He is oriented to person, place, and time. He appears well-developed and well-nourished.  HENT:  Head: Normocephalic.  Right Ear: External ear normal.  Left Ear: External ear normal.  Nose: Nose normal.  Mouth/Throat: Oropharynx is clear and moist.  Eyes: EOM  are normal. Pupils are equal, round, and reactive to light.  Neck: Normal range of motion. Neck supple. No JVD present. No thyromegaly present.  Cardiovascular: Normal rate, regular rhythm, normal heart sounds and intact distal pulses.  Exam reveals no gallop and no friction rub.   No murmur  heard. Pulmonary/Chest: Effort normal and breath sounds normal. No respiratory distress. He has no wheezes. He has no rales. He exhibits no tenderness.  Abdominal: Soft. Bowel sounds are normal. He exhibits no mass. There is no tenderness.  Musculoskeletal: Normal range of motion. He exhibits no edema.  Lymphadenopathy:    He has no cervical adenopathy.  Neurological: He is alert and oriented to person, place, and time. No cranial nerve deficit.  Skin: Skin is warm and dry.  Psychiatric: He has a normal mood and affect. His behavior is normal. Judgment and thought content normal.   BP 137/84 mmHg  Pulse 84  Temp(Src) 98.1 F (36.7 C) (Oral)  Ht  (1.727 m)  Wt 308 lb 6.4 oz (139.889 kg)  BMI 46.90 kg/m2  Chest x ray- no cardipumonary problems noted-Preliminary reading by Paulene Floor, FNP  [facility]   EKG-  Brendolyn Patty, FNP      Assessment & Plan:  1. Metabolic syndrome Discussed low carb diet and exerrcise - POCT glycosylated hemoglobin (Hb A1C)  2. Morbid obesity, unspecified obesity type (HCC) Discussed diet and exercise for person with BMI >25 Will recheck weight in 3-6 months  3. Hx of smoking No longer smoker - DG Chest 2 View; Future  4. Screening for malignant neoplasm of the rectum - Fecal occult blood, imunochemical; Future  5. Essential hypertension, benign Do not add salt to diet - EKG 12-Lead - amLODipine (NORVASC) 5 MG tablet; Take 1 tablet (5 mg total) by mouth daily.  Dispense: 90 tablet; Refill: 1 - losartan-hydrochlorothiazide (HYZAAR) 100-25 MG tablet; TAKE ONE TABLET BY MOUTH ONCE DAILY  Dispense: 90 tablet; Refill: 0  6. Gastroesophageal reflux disease with esophagitis Avoid spicy foods Do not eat 2 hours prior to bedtime - omeprazole (PRILOSEC) 40 MG capsule; Take 1 capsule (40 mg total) by mouth every morning.  Dispense: 90 capsule; Refill: 0  7. Hyperlipemia Low fat diet encouraged - simvastatin (ZOCOR) 40 MG tablet; Take 1  tablet (40 mg total) by mouth at bedtime.  Dispense: 90 tablet; Refill: 1  8. Neuropathy (HCC) - gabapentin (NEURONTIN) 300 MG capsule; Take 1 capsule (300 mg total) by mouth at bedtime.  Dispense: 90 capsule; Refill: 2 - meloxicam (MOBIC) 15 MG tablet; Take 1 tablet (15 mg total) by mouth daily.  Dispense: 30 tablet; Refill: 3  9. Allergic rhinitis, unspecified allergic rhinitis type    Labs pending Health maintenance reviewed Diet and exercise encouraged Continue all meds Follow up  In 3 months   Mary-Margaret Daphine Deutscher, FNP

## 2015-03-24 NOTE — Patient Instructions (Signed)

## 2015-07-07 ENCOUNTER — Ambulatory Visit (INDEPENDENT_AMBULATORY_CARE_PROVIDER_SITE_OTHER): Payer: Medicare HMO | Admitting: Nurse Practitioner

## 2015-07-07 ENCOUNTER — Encounter: Payer: Self-pay | Admitting: Nurse Practitioner

## 2015-07-07 VITALS — BP 122/76 | HR 75 | Temp 96.9°F | Ht 68.0 in | Wt 308.0 lb

## 2015-07-07 DIAGNOSIS — K2100 Gastro-esophageal reflux disease with esophagitis, without bleeding: Secondary | ICD-10-CM

## 2015-07-07 DIAGNOSIS — I10 Essential (primary) hypertension: Secondary | ICD-10-CM | POA: Diagnosis not present

## 2015-07-07 DIAGNOSIS — G629 Polyneuropathy, unspecified: Secondary | ICD-10-CM

## 2015-07-07 DIAGNOSIS — G609 Hereditary and idiopathic neuropathy, unspecified: Secondary | ICD-10-CM | POA: Diagnosis not present

## 2015-07-07 DIAGNOSIS — E785 Hyperlipidemia, unspecified: Secondary | ICD-10-CM

## 2015-07-07 DIAGNOSIS — E8881 Metabolic syndrome: Secondary | ICD-10-CM

## 2015-07-07 LAB — BAYER DCA HB A1C WAIVED: HB A1C (BAYER DCA - WAIVED): 6.2 % (ref <7.0)

## 2015-07-07 MED ORDER — LOSARTAN POTASSIUM-HCTZ 100-25 MG PO TABS: ORAL_TABLET | ORAL | Status: DC

## 2015-07-07 MED ORDER — OMEPRAZOLE 40 MG PO CPDR: 40.0000 mg | DELAYED_RELEASE_CAPSULE | Freq: Every morning | ORAL | Status: DC

## 2015-07-07 MED ORDER — MELOXICAM 15 MG PO TABS: 15.0000 mg | ORAL_TABLET | Freq: Every day | ORAL | Status: DC

## 2015-07-07 MED ORDER — GABAPENTIN 300 MG PO CAPS: 300.0000 mg | ORAL_CAPSULE | Freq: Every day | ORAL | Status: DC

## 2015-07-07 NOTE — Progress Notes (Addendum)
Subjective:    Patient ID: Evan Smith, male    DOB: 09/13/1955, 60 y.o.   MRN: [id number]   Patient here today for follow up of chronic medical problems.  Outpatient Encounter Prescriptions as of 07/07/2015  Medication Sig  . amLODipine (NORVASC) 5 MG tablet Take 1 tablet (5 mg total) by mouth daily.  Marland Kitchen aspirin 81 MG tablet Take 81 mg by mouth daily.  . cetirizine (ZYRTEC) 10 MG tablet Take 1 tablet (10 mg total) by mouth daily. (Patient not taking: Reported on 03/24/2015)  . Cholecalciferol (VITAMIN D3) 1000 UNITS CAPS Take 2 capsules by mouth daily.   . cyclobenzaprine (FLEXERIL) 10 MG tablet Take 1 tablet (10 mg total) by mouth 3 (three) times daily as needed for muscle spasms.  Marland Kitchen docusate sodium (COLACE) 100 MG capsule Take 200 mg by mouth daily as needed for mild constipation.  . fluticasone (FLONASE) 50 MCG/ACT nasal spray Place 2 sprays into both nostrils daily.  Marland Kitchen gabapentin (NEURONTIN) 300 MG capsule Take 1 capsule (300 mg total) by mouth at bedtime.  Marland Kitchen ibuprofen (ADVIL,MOTRIN) 200 MG tablet Take 200 mg by mouth 2 (two) times daily as needed.  Marland Kitchen losartan-hydrochlorothiazide (HYZAAR) 100-25 MG tablet TAKE ONE TABLET BY MOUTH ONCE DAILY  . meloxicam (MOBIC) 15 MG tablet Take 1 tablet (15 mg total) by mouth daily.  Marland Kitchen omeprazole (PRILOSEC) 40 MG capsule Take 1 capsule (40 mg total) by mouth every morning.  . Potassium Gluconate 80 MG TABS Take 99 mg by mouth.  . simvastatin (ZOCOR) 40 MG tablet Take 1 tablet (40 mg total) by mouth at bedtime.   No facility-administered encounter medications on file as of 07/07/2015.     Hypertension This is a chronic problem. The current episode started more than 1 year ago. The problem is controlled. Pertinent negatives include no chest pain. Risk factors for coronary artery disease include dyslipidemia and obesity. Past treatments include angiotensin blockers and diuretics. The current treatment provides moderate improvement. Compliance  problems include diet and exercise.  There is no history of CAD/MI or CVA.  Hyperlipidemia This is a chronic problem. The current episode started more than 1 year ago. The problem is resistant. Recent lipid tests were reviewed and are variable. Pertinent negatives include no chest pain or myalgias. Current antihyperlipidemic treatment includes statins. The current treatment provides moderate improvement of lipids. Compliance problems include adherence to diet and adherence to exercise.  Risk factors for coronary artery disease include dyslipidemia, male sex and obesity.  GERD Patient currently on omeprazole which works well for symptoms Neuropathy of shoulders and back Neurotin 336m- does not take everyday- only takes couple of times a week. ALso takes mobis daily for neck pain. Metabolic syndrome Blood sugars have been slightly elevated the last several times labs have been checked, but patient does not check blood sugars. His hbga1c was normal.   Review of Systems  Constitutional: Negative.   HENT: Negative.   Respiratory: Negative.   Cardiovascular: Negative for chest pain.  Gastrointestinal: Negative.   Genitourinary: Negative.   Musculoskeletal: Negative for myalgias.  Neurological: Negative.   Psychiatric/Behavioral: Negative.        Objective:   Physical Exam  Constitutional: He is oriented to person, place, and time. He appears well-developed and well-nourished.  HENT:  Head: Normocephalic.  Right Ear: External ear normal.  Left Ear: External ear normal.  Nose: Nose normal.  Mouth/Throat: Oropharynx is clear and moist.  Eyes: EOM are normal. Pupils are equal, round, and  reactive to light.  Neck: Normal range of motion. Neck supple. No JVD present. No thyromegaly present.  Cardiovascular: Normal rate, regular rhythm, normal heart sounds and intact distal pulses.  Exam reveals no gallop and no friction rub.   No murmur heard. Pulmonary/Chest: Effort normal and breath  sounds normal. No respiratory distress. He has no wheezes. He has no rales. He exhibits no tenderness.  Abdominal: Soft. Bowel sounds are normal. He exhibits no mass. There is no tenderness.  Musculoskeletal: Normal range of motion. He exhibits no edema.  Lymphadenopathy:    He has no cervical adenopathy.  Neurological: He is alert and oriented to person, place, and time. No cranial nerve deficit.  Skin: Skin is warm and dry.  Psychiatric: He has a normal mood and affect. His behavior is normal. Judgment and thought content normal.   BP 122/76 mmHg  Pulse 75  Temp(Src) 96.9 F (36.1 C) (Oral)  Ht '5\' 8"'  (1.727 m)  Wt 308 lb (139.708 kg)  BMI 46.84 kg/m2       Assessment & Plan:  1. Essential hypertension Do not add salt to diet - CMP14+EGFR - losartan-hydrochlorothiazide (HYZAAR) 100-25 MG tablet; TAKE ONE TABLET BY MOUTH ONCE DAILY  Dispense: 90 tablet; Refill: 0   2. Hereditary and idiopathic peripheral neuropathy  3. Hyperlipidemia Low fat diet - Lipid panel  4. Metabolic syndrome Watch carbs in diet- HGBA!C pending - Bayer DCA Hb A1c Waived  5. Gastroesophageal reflux disease with esophagitis Avoid spicy foods Do not eat 2 hours prior to bedtime - omeprazole (PRILOSEC) 40 MG capsule; Take 1 capsule (40 mg total) by mouth every morning.  Dispense: 90 capsule; Refill: 0  7. Neuropathy (HCC) - gabapentin (NEURONTIN) 300 MG capsule; Take 1 capsule (300 mg total) by mouth at bedtime.  Dispense: 90 capsule; Refill: 2   Given hemoccult cards to do since did not do at last appointment Labs pending Health maintenance reviewed Diet and exercise encouraged Continue all meds Follow up  In 3 months  [place], FNP

## 2015-07-07 NOTE — Patient Instructions (Signed)
Exercising to Stay Healthy Exercising regularly is important. It has many health benefits, such as:  Improving your overall fitness, flexibility, and endurance.  Increasing your bone density.  Helping with weight control.  Decreasing your body fat.  Increasing your muscle strength.  Reducing stress and tension.  Improving your overall health. In order to become healthy and stay healthy, it is recommended that you do moderate-intensity and vigorous-intensity exercise. You can tell that you are exercising at a moderate intensity if you have a higher heart rate and faster breathing, but you are still able to hold a conversation. You can tell that you are exercising at a vigorous intensity if you are breathing much harder and faster and cannot hold a conversation while exercising. HOW OFTEN SHOULD I EXERCISE? Choose an activity that you enjoy and set realistic goals. Your health care provider can help you to make an activity plan that works for you. Exercise regularly as directed by your health care provider. This may include:   Doing resistance training twice each week, such as:  Push-ups.  Sit-ups.  Lifting weights.  Using resistance bands.  Doing a given intensity of exercise for a given amount of time. Choose from these options:  150 minutes of moderate-intensity exercise every week.  75 minutes of vigorous-intensity exercise every week.  A mix of moderate-intensity and vigorous-intensity exercise every week. Children, pregnant women, people who are out of shape, people who are overweight, and older adults may need to consult a health care provider for individual recommendations. If you have any sort of medical condition, be sure to consult your health care provider before starting a new exercise program.  WHAT ARE SOME EXERCISE IDEAS? Some moderate-intensity exercise ideas include:   Walking at a rate of 1 mile in 15  minutes.  Biking.  Hiking.  Golfing.  Dancing. Some vigorous-intensity exercise ideas include:   Walking at a rate of at least 4.5 miles per hour.  Jogging or running at a rate of 5 miles per hour.  Biking at a rate of at least 10 miles per hour.  Lap swimming.  Roller-skating or in-line skating.  Cross-country skiing.  Vigorous competitive sports, such as football, basketball, and soccer.  Jumping rope.  Aerobic dancing. WHAT ARE SOME EVERYDAY ACTIVITIES THAT CAN HELP ME TO GET EXERCISE?  Yard work, such as:  Pushing a lawn mower.  Raking and bagging leaves.  Washing and waxing your car.  Pushing a stroller.  Shoveling snow.  Gardening.  Washing windows or floors. HOW CAN I BE MORE ACTIVE IN MY DAY-TO-DAY ACTIVITIES?  Use the stairs instead of the elevator.  Take a walk during your lunch break.  If you drive, park your car farther away from work or school.  If you take public transportation, get off one stop early and walk the rest of the way.  Make all of your phone calls while standing up and walking around.  Get up, stretch, and walk around every 30 minutes throughout the day. WHAT GUIDELINES SHOULD I FOLLOW WHILE EXERCISING?  Do not exercise so much that you hurt yourself, feel dizzy, or get very short of breath.  Consult your health care provider before starting a new exercise program.  Wear comfortable clothes and shoes with good support.  Drink plenty of water while you exercise to prevent dehydration or heat stroke. Body water is lost during exercise and must be replaced.  Work out until you breathe faster and your heart beats faster.   This information   is not intended to replace advice given to you by your health care provider. Make sure you discuss any questions you have with your health care provider.   Document Released: 03/02/2010 Document Revised: 02/18/2014 Document Reviewed: 07/01/2013 Elsevier Interactive Patient Education  2016 [employer]  

## 2015-07-08 LAB — CMP14+EGFR
A/G RATIO: 1.9 (ref 1.2–2.2)
ALT: 20 IU/L (ref 0–44)
AST: 27 IU/L (ref 0–40)
Albumin: 4.7 g/dL (ref 3.6–4.8)
Alkaline Phosphatase: 59 IU/L (ref 39–117)
BUN/Creatinine Ratio: 13 (ref 10–24)
BUN: 16 mg/dL (ref 8–27)
Bilirubin Total: 0.4 mg/dL (ref 0.0–1.2)
CALCIUM: 9.4 mg/dL (ref 8.6–10.2)
CO2: 18 mmol/L (ref 18–29)
Chloride: 100 mmol/L (ref 96–106)
Creatinine, Ser: 1.27 mg/dL (ref 0.76–1.27)
GFR, EST AFRICAN AMERICAN: 70 mL/min/{1.73_m2} (ref >59)
GFR, EST NON AFRICAN AMERICAN: 61 mL/min/{1.73_m2} (ref >59)
GLOBULIN, TOTAL: 2.5 g/dL (ref 1.5–4.5)
Glucose: 97 mg/dL (ref 65–99)
Potassium: 4.9 mmol/L (ref 3.5–5.2)
SODIUM: 143 mmol/L (ref 134–144)
Total Protein: 7.2 g/dL (ref 6.0–8.5)

## 2015-07-08 LAB — LIPID PANEL
CHOL/HDL RATIO: 3 ratio (ref 0.0–5.0)
CHOLESTEROL TOTAL: 139 mg/dL (ref 100–199)
HDL: 47 mg/dL (ref >39)
LDL CALC: 66 mg/dL (ref 0–99)
TRIGLYCERIDES: 128 mg/dL (ref 0–149)
VLDL CHOLESTEROL CAL: 26 mg/dL (ref 5–40)

## 2015-08-03 ENCOUNTER — Other Ambulatory Visit: Payer: Medicare HMO

## 2015-08-03 DIAGNOSIS — Z1212 Encounter for screening for malignant neoplasm of rectum: Secondary | ICD-10-CM | POA: Diagnosis not present

## 2015-08-04 DIAGNOSIS — G4733 Obstructive sleep apnea (adult) (pediatric): Secondary | ICD-10-CM | POA: Diagnosis not present

## 2015-08-06 LAB — FECAL OCCULT BLOOD, IMMUNOCHEMICAL: Fecal Occult Bld: NEGATIVE

## 2015-09-22 ENCOUNTER — Other Ambulatory Visit: Payer: Self-pay | Admitting: Nurse Practitioner

## 2015-09-22 DIAGNOSIS — I10 Essential (primary) hypertension: Secondary | ICD-10-CM

## 2015-10-03 ENCOUNTER — Telehealth: Payer: Self-pay | Admitting: Nurse Practitioner

## 2015-10-03 NOTE — Telephone Encounter (Signed)
No NTBS 

## 2015-10-04 NOTE — Telephone Encounter (Signed)
Patient aware ntbs. No money for copay

## 2015-10-04 NOTE — Telephone Encounter (Signed)
lmtcb

## 2015-10-12 ENCOUNTER — Other Ambulatory Visit: Payer: Self-pay | Admitting: *Deleted

## 2015-10-12 DIAGNOSIS — J309 Allergic rhinitis, unspecified: Secondary | ICD-10-CM

## 2015-10-12 MED ORDER — CETIRIZINE HCL 10 MG PO TABS: 10.0000 mg | ORAL_TABLET | Freq: Every day | ORAL | 11 refills | Status: DC

## 2015-11-07 DIAGNOSIS — G4733 Obstructive sleep apnea (adult) (pediatric): Secondary | ICD-10-CM | POA: Diagnosis not present

## 2015-11-10 DIAGNOSIS — G4733 Obstructive sleep apnea (adult) (pediatric): Secondary | ICD-10-CM | POA: Diagnosis not present

## 2015-11-24 ENCOUNTER — Ambulatory Visit (INDEPENDENT_AMBULATORY_CARE_PROVIDER_SITE_OTHER): Payer: Medicare HMO

## 2015-11-24 ENCOUNTER — Ambulatory Visit (INDEPENDENT_AMBULATORY_CARE_PROVIDER_SITE_OTHER): Payer: Medicare HMO | Admitting: Pediatrics

## 2015-11-24 ENCOUNTER — Encounter: Payer: Self-pay | Admitting: Pediatrics

## 2015-11-24 VITALS — BP 134/80 | HR 70 | Temp 97.9°F | Ht 68.0 in | Wt 305.6 lb

## 2015-11-24 DIAGNOSIS — I10 Essential (primary) hypertension: Secondary | ICD-10-CM | POA: Diagnosis not present

## 2015-11-24 DIAGNOSIS — E785 Hyperlipidemia, unspecified: Secondary | ICD-10-CM | POA: Diagnosis not present

## 2015-11-24 DIAGNOSIS — M25552 Pain in left hip: Secondary | ICD-10-CM

## 2015-11-24 DIAGNOSIS — J309 Allergic rhinitis, unspecified: Secondary | ICD-10-CM

## 2015-11-24 DIAGNOSIS — E8881 Metabolic syndrome: Secondary | ICD-10-CM | POA: Diagnosis not present

## 2015-11-24 DIAGNOSIS — Z23 Encounter for immunization: Secondary | ICD-10-CM

## 2015-11-24 DIAGNOSIS — R7303 Prediabetes: Secondary | ICD-10-CM

## 2015-11-24 DIAGNOSIS — R609 Edema, unspecified: Secondary | ICD-10-CM

## 2015-11-24 DIAGNOSIS — R531 Weakness: Secondary | ICD-10-CM | POA: Diagnosis not present

## 2015-11-24 LAB — BAYER DCA HB A1C WAIVED: HB A1C: 6.1 % (ref <7.0)

## 2015-11-24 MED ORDER — FUROSEMIDE 20 MG PO TABS: 20.0000 mg | ORAL_TABLET | Freq: Every day | ORAL | 1 refills | Status: DC

## 2015-11-24 MED ORDER — FLUTICASONE PROPIONATE 50 MCG/ACT NA SUSP: 2.0000 | Freq: Every day | NASAL | 6 refills | Status: DC

## 2015-11-24 NOTE — Patient Instructions (Signed)
Stop meloxicam if using ibuprofen  Hip xray and labs today  Start furosemide 20mg  daily for swelling If swelling goes away, OK to stop this medication  Schedule appointment with Tammy to talk about nutrition and weight loss strategies  Come back to see us for swelling, kidney and hip follow up in 4 weeks

## 2015-11-24 NOTE — Progress Notes (Signed)
  Subjective:   Patient ID: Evan Smith, male    DOB: 10-Jan-1956, 60 y.o.   MRN: [id number] CC: Follow-up and Dizziness  HPI: Evan Smith is a 60 y.o. male presenting for Follow-up and Dizziness  When he lays down, bends over or stands up too quick feels lightheaded If he goes slower doesn't happen as much Always with position changes [place] once in six months, doing a lot of yard work  L hip giving him problems, gives out Sometimes hip aches some Going up stairs hurts Walking a long time bothers both back and hip Taking ibuprofen daily  Swelling has been present for several months per pt Possibly worse at night or after standin glong periods of time No SOB, no CP Breathing has been fine  BMI elevated: Drinks sodas, sugary beverages daily Hesitant about stopping Pain with walking  Relevant past medical, surgical, family and social history reviewed. Allergies and medications reviewed and updated. History  Smoking Status  . Former Smoker  . Packs/day: 0.50  . Years: 25.00  . Types: Cigarettes  . Quit date: 02/12/2004  Smokeless Tobacco  . Never Used   ROS: Per HPI   Objective:    BP 134/80   Pulse 70   Temp 97.9 F (36.6 C) (Oral)   Ht '5\' 8"'$  (1.727 m)   Wt (!) 305 lb 9.6 oz (138.6 kg)   BMI 46.47 kg/m   Wt Readings from Last 3 Encounters:  11/24/15 (!) 305 lb 9.6 oz (138.6 kg)  07/07/15 (!) 308 lb (139.7 kg)  03/24/15 (!) 308 lb 6.4 oz (139.9 kg)    Gen: NAD, alert, cooperative with exam, NCAT EYES: EOMI, no conjunctival injection, or no icterus ENT:  TMs pearly gray b/l, OP without erythema LYMPH: no cervical LAD CV: NRRR, normal S1/S2, no murmur, distal pulses 2+ b/l Resp: CTABL, no wheezes, normal WOB  Ext: 2+ pitting edema LE b/l, warm Neuro: Alert and oriented MSK: decreased int and ext rotation L hip compared with R  Assessment & Plan:  Saahir was seen today for follow-up and dizziness.  Diagnoses and all orders for this visit:  Essential  hypertension adeuqate control today Cont current meds -     CMP14+EGFR  Hyperlipidemia, unspecified hyperlipidemia type On statin, cont  Metabolic syndrome Prediabetes Severe obesity (BMI >= 40) (HCC) Discussed lifestyle changes, strongly recommended decreasing sodas, drinking mostly water, portion control Pt to follow up with Tammy for nutrition advice -     Bayer DCA Hb A1c Waived  Swelling -     furosemide (LASIX) 20 MG tablet; Take 1 tablet (20 mg total) by mouth daily. -     Brain natriuretic peptide  Pain of left hip joint Will get xray to eval for OA Discussed safe NSAID use Cont to address weight as below -     DG HIP UNILAT W OR W/O PELVIS 2-3 VIEWS LEFT; Future  Allergic rhinitis, unspecified chronicity, unspecified seasonality, unspecified trigger -     fluticasone (FLONASE) 50 MCG/ACT nasal spray; Place 2 sprays into both nostrils daily.  Encounter for immunization -     Flu Vaccine QUAD 36+ mos IM   Follow up plan: Return for Schedule appt with Tammy for nutrition when able, Dr Evette Doffing or PCP f/u in 4 weeks. Assunta Found, MD [place]

## 2015-11-25 LAB — CMP14+EGFR
ALBUMIN: 4.6 g/dL (ref 3.6–4.8)
ALT: 24 IU/L (ref 0–44)
AST: 26 IU/L (ref 0–40)
Albumin/Globulin Ratio: 1.9 (ref 1.2–2.2)
Alkaline Phosphatase: 64 IU/L (ref 39–117)
BILIRUBIN TOTAL: 0.3 mg/dL (ref 0.0–1.2)
BUN/Creatinine Ratio: 14 (ref 10–24)
BUN: 17 mg/dL (ref 8–27)
CALCIUM: 9.3 mg/dL (ref 8.6–10.2)
CHLORIDE: 98 mmol/L (ref 96–106)
CO2: 27 mmol/L (ref 18–29)
CREATININE: 1.2 mg/dL (ref 0.76–1.27)
GFR, EST AFRICAN AMERICAN: 75 mL/min/{1.73_m2} (ref >59)
GFR, EST NON AFRICAN AMERICAN: 65 mL/min/{1.73_m2} (ref >59)
GLUCOSE: 98 mg/dL (ref 65–99)
Globulin, Total: 2.4 g/dL (ref 1.5–4.5)
Potassium: 4.5 mmol/L (ref 3.5–5.2)
Sodium: 140 mmol/L (ref 134–144)
TOTAL PROTEIN: 7 g/dL (ref 6.0–8.5)

## 2015-11-25 LAB — BRAIN NATRIURETIC PEPTIDE: BNP: <2.5 pg/mL (ref 0.0–100.0)

## 2015-12-19 ENCOUNTER — Encounter: Payer: Self-pay | Admitting: Pharmacist

## 2015-12-19 ENCOUNTER — Ambulatory Visit (INDEPENDENT_AMBULATORY_CARE_PROVIDER_SITE_OTHER): Payer: Medicare HMO | Admitting: Pharmacist

## 2015-12-19 DIAGNOSIS — R7303 Prediabetes: Secondary | ICD-10-CM | POA: Diagnosis not present

## 2015-12-19 MED ORDER — METFORMIN HCL ER 500 MG PO TB24: 500.0000 mg | ORAL_TABLET | Freq: Every day | ORAL | 2 refills | Status: DC

## 2015-12-19 NOTE — Patient Instructions (Addendum)
Decrease soda intake  Try to increase physical activity - start with 10 minutes daily and increase as able to goal of 150 minutes per week.   Prediabetes Many people have heard about type 2 diabetes, but its common precursor, prediabetes, doesn't get as much attention. Prediabetes is estimated by CDC to affect 86 million Americans (this includes 51% of people 65 years and older), and an estimated 90% of people with prediabetes don't even know it. According to the CDC, 15-30% of these individuals will develop type 2 diabetes within five years. In other words, as many as 26 million people that currently have prediabetes could develop type 2 diabetes by 2020, effectively doubling the number of people with type 2 diabetes in the [place].  What is prediabetes? Prediabetes is a condition where blood sugar levels are higher than normal, but not high enough to be diagnosed as type 2 diabetes. This occurs when the body has problems in processing glucose properly, and sugar starts to build up in the bloodstream instead of fueling cells in muscles and tissues. Insulin is the hormone that tells cells to take up glucose, and in prediabetes, people typically initially develop insulin resistance (where the body's cells can't respond to insulin as well), and over time (if no actions are taken to reverse the situation) the ability to produce sufficient insulin is reduced. People with prediabetes also commonly have high blood pressure as well as abnormal blood lipids (e.g. cholesterol). These often occur prior to the rise of blood glucose levels.  What are the symptoms of prediabetes? People typically do not have symptoms of prediabetes, which is partially why up to 90% of people don't know they have it. The ADA reports that some people with prediabetes may develop symptoms of type 2 diabetes, though even many people diagnosed with type 2 diabetes show little or no symptoms initially at diagnosis.  How is prediabetes  diagnosed? According to the American Diabetes Association, prediabetes can be diagnosed through one of the following tests: 1. A glycated hemoglobin test, also known as HbA1c or simply A1c, gives an idea of the body's average blood sugar levels from the past two or three months. It is usually done with a small drop of blood from a fingerstick or as part of having blood taken in a doctor's office, hospital, or laboratory. A1c Level Diagnosis  Less than 5.7% Normal  5.7% to 6.4% Prediabetes  6.5% and higher Diabetes  2. A fasting plasma glucose (FPG) test measures a person's blood glucose level after fasting (not eating) for eight hours - this is typically done in the morning. If a test shows positive for prediabetes, a second test should be taken on a different day to confirm the diagnosis. FPG Level Diagnosis  Less than 100 mg/dl Normal  161100 mg/dl to 096125 mg/dl Prediabetes  045126 mg/dl and higher Diabetes   Who is at risk of developing prediabetes? A well-known paper published in the Lancet in 2010 recommends screening for type 2 diabetes (which would also screen for prediabetes) every 3-5 years in all adults over the age of 61, regardless of other risk factors. Overweight and obese adults (a BMI >25 kg/m2) are also at significantly greater risk for developing prediabetes, as well as people with a family history of type 2 diabetes. According to the CDC, several other factors can have moderate influences on prediabetes risk in addition to age, weight, and family history: People with an [place] American, Hispanic/Latino, American [place], [place] American, or [place]er racial  or ethnic background. The 2015 ADA Standards of Medical Care recommendations suggest Asian Americans with a BMI of 23 or above be screened for type 2 diabetes.  Women with a history of diabetes during pregnancy ("gestational diabetes") or have given birth to a baby weighing nine pounds or more. People who are physically active  fewer than three times a week. The CDC offers a fast, online screening test for evaluating the risk for prediabetes. The ADA also offers a screening test to assess type 2 diabetes risk. Of course, these tests do not themselves confirm a prediabetes diagnosis, but just if someone may be at higher risk of developing it.  Why do people develop prediabetes? Prediabetes develops through a combination of factors that are still being investigated. For sure, lifestyle factors (food, exercise, stress, sleep) play a role, but family history and genetics certainly do as well. It is easy to assume that prediabetes is the result of being overweight, but the relationship is not that simple. While obesity is one underlying cause of insulin resistance, many overweight individuals may never develop prediabetes or type 2 diabetes, and a minority of people with prediabetes have never been overweight. To make matters worse, it can be increasingly difficult to make healthy choices in today's toxic food environment that steers all of us to make the wrong food choices, and there are many factors that can contribute to weight gain in addition to diet.  Is a prediabetes diagnosis serious? There has been significant debate around the term 'prediabetes,' and whether it should be considered cause for alarm. On the one hand, it serves as a risk factor for type 2 diabetes and a host of other complications, including heart disease, and ultimately prediabetes implies that a degree of metabolic problems have started to occur in the body. On the other hand, it places a diagnosis on many people who may never develop type 2 diabetes. Again, according to the CDC, 15-30% of those with prediabetes will develop type 2 diabetes within five years. However, a 2012 Lancet article cites 5-10% of those with prediabetes each year will also revert back to healthy blood sugars. What's critical is not necessarily the cutoff itself, but where someone falls  within the ranges listed above. The level of risk of developing type 2 diabetes is closely related to A1c or FPG at diagnosis. Those in the higher ranges (A1c closer to 6.4%, FPG closer to 125 mg/dl) are much more likely to progress to type 2 diabetes, whereas those at lower ranges (A1c closer to 5.7%, FPG closer to 100 mg/dl) are relatively more likely to revert back to normal glucose levels or stay within the prediabetes range. Age of diagnosis and the level of insulin production still occurring at diagnosis also impact the chances of reverting to normoglycemia (normal blood sugar levels).  What can people with prediabetes do to avoid the progression from prediabetes to type 2 diabetes? The most important action people diagnosed with prediabetes can take is to focus on living a healthy lifestyle. This includes making healthy food choices, controlling portions, and increasing physical activity. Regarding weight control, research shows losing 5-7% (often about 10-20 lbs.) from your initial body weight and keeping off as much of that weight over time as possible is critical to lowering the risk of type 2 diabetes. This task is of course easier said than done, but sustained weight loss over time can be key to improving health and delaying or preventing the onset of type 2 diabetes. Several prediabetes  interventions exist based on evidence from the landmark Diabetes Prevention Program (DPP) study. The DPP study reported that moderate weight loss (5-7% of body weight, or ~10-15 lbs. for someone weighing 200 lbs.), counseling, and education on healthy eating and behavior reduced the risk of developing type 2 diabetes by 58%. Data presented at the ADA 2014 conference showed that after 15 years of follow-up of the DPP study groups, the results were still encouraging: 27% of those in the original lifestyle group had a significant reduction in type 2 diabetes progression compared to the control group. If you or someone  you know has been told they have prediabetes, here are a few helpful resources: In-person diabetes prevention programs: The CDC offers a one year long lifestyle change program through its National Diabetes Prevention Program (NDPP) at various locations throughout the [place] to help participants adopt healthy habits and prevent or delay progression to type 2 diabetes. This program is a major undertaking by the CDC to translate the findings from the DPP study into a real world setting, a significant effort indeed! Online diabetes prevention programs: The CDC has now given pending recognition status to three digital prevention programs: [facility], [facility], and [facility]. These offer the same one year long educational curriculum as the DPP study, but in an online format. Some insurance companies and employers cover these programs, and you can find more information at the links above. These digital versions are excellent options for those who live far away from NDPP locations or who prefer the anonymity and convenience of doing the program online. Metformin: The DPP study found that metformin, the safest first-line therapy for type 2 diabetes, may help delay the onset of type 2 diabetes in people with prediabetes. Participants who took the low-cost generic drug had a 31% reduced risk of developing type 2 diabetes compared to the control group (those not on metformin or intensive lifestyle intervention). Again, 15-year follow up data showed that 17% of those on metformin continued to have a significant reduction in type 2 progression. At this time, metformin (or any other medication, for that matter) is not currently FDA approved for prediabetes, and it is sometimes prescribed "off-label" by a healthcare provider. Your healthcare provider can give you more information and determine whether metformin is a good option for you.  Can prediabetes be "cured"? In the early stages of prediabetes (and type 2 diabetes),  diligent attention to food choices and activity, and most importantly weight loss, can improve blood sugar numbers, effectively "reversing" the disease and reducing the odds of developing type 2 diabetes. However, some people may have underlying factors (such as family history and genetics) that put them at a greater risk of type 2 diabetes, meaning they will always require careful attention to blood sugar levels and lifestyle choices. Returning to old habits will likely put someone back on the road to prediabetes, and eventually, type 2 diabetes

## 2015-12-19 NOTE — Progress Notes (Signed)
Patient ID: Evan Smith, male   DOB: 02/04/1956, 60 y.o.   MRN: [id number]   Subjective:     Evan Smith is a 60 y.o. male who I am asked to see in consultation for evaluation and treatment of obesity and pre diabetes. Patient cites health, self-image as reasons for wanting to lose weight.  History of Weight Loss Efforts Successful weight loss techniques attempted: none Unsuccessful weight loss techniques attempted: self-directed dieting  Current Exercise Habits none  Current Eating Habits Number of regular meals per day: 2 Number of snacking episodes per day: 1 Who shops for food? patient and wife Who prepares food? patient and wife Who eats with patient? patient and wife Binge behavior?: no Purge behavior? no Anorexic behavior? no Eating precipitated by stress? no Guilt feelings associated with eating? no  Other Potential Contributing Factors Use of alcohol: average 0 drinks/week Use of medications that may cause weight gain gabapentin History of past abuse? none Psych History: obesity Comorbidities: dyslipidemias, GERD, hypertension, peripheral vascular disease, sleep apnea/hypopnea and pre diabetes The following portions of the patient's history were reviewed and updated as appropriate: allergies, current medications, past medical history and problem list.    Objective:    BP 138/82   Pulse 74   Ht 5\' 8"  (1.727 m)   Wt (!) 302 lb (137 kg)   BMI 45.92 kg/m  Body mass index is 45.92 kg/m.   Last A1c = 6.1% (11/24/2015)   Assessment:    Prediabetes Obesity with BMI and comorbidities as noted above. Signs of hypothyroidism: none Signs of hypercortisolism: none Contraindications to weight loss: none Patient readiness to commit to diet and activity changes: good Barriers to weight loss: none     Plan:    1. Discussed pre diabetes, importance of preventing diabetes and treatment  2. General patient education ('Yes' if discussed, 'No' if not) Importance  of long-term maintenance tx in weight loss: yes Diabetes Prevention Program study and pre diabetes Use non-food self-rewards to reinforce behavior changes: yes Elicit support from others; identify saboteurs: yes Goal weight loss of 30 lbs over the next 6 months 3. Diet interventions: moderate (500 kCal/d) deficit diet Proper food choices reviewed: yes No sugar containing beverages - especiallly pepsi which patiemt has 2 per day Preparation techniques reviewed: yes Careful meal planning; avoiding ad hoc eating: yes Stimulus control to control unhealthy eating: yes Handouts given: sample diet; low calorie / low carb diet   4. Exercise intervention:  Informal measures, e.g. taking stairs instead of elevator: yes Formal exercise regimen: yes - goal of 90 minutes per week to start and work up to 150 minutes weekly 5. Start metformin XR 500mg  1 tablet daily  6. Patient to keep a weight log that we will review at follow up. 7. Follow up: 1 week with PCP and 2 months with me..Marland Kitchen

## 2015-12-22 ENCOUNTER — Encounter: Payer: Self-pay | Admitting: Pediatrics

## 2015-12-22 ENCOUNTER — Ambulatory Visit (INDEPENDENT_AMBULATORY_CARE_PROVIDER_SITE_OTHER): Payer: Medicare HMO | Admitting: Pediatrics

## 2015-12-22 VITALS — BP 136/84 | HR 69 | Temp 97.6°F | Ht 68.0 in | Wt 304.6 lb

## 2015-12-22 DIAGNOSIS — M25512 Pain in left shoulder: Secondary | ICD-10-CM

## 2015-12-22 MED ORDER — NAPROXEN 500 MG PO TABS: 500.0000 mg | ORAL_TABLET | Freq: Two times a day (BID) | ORAL | 0 refills | Status: DC

## 2015-12-22 NOTE — Patient Instructions (Addendum)
Shoulder Range of Motion Exercises Shoulder range of motion (ROM) exercises are designed to keep the shoulder moving freely. They are often recommended for people who have shoulder pain. MOVEMENT EXERCISE When you are able, do this exercise 5-6 days per week, or as told by your health care provider. Work toward doing 2 sets of 10 swings. Pendulum Exercise How To Do This Exercise Lying Down 1. Lie face-down on a bed with your abdomen close to the side of the bed. 2. Let your arm hang over the side of the bed. 3. Relax your shoulder, arm, and hand. 4. Slowly and gently swing your arm forward and back. Do not use your neck muscles to swing your arm. They should be relaxed. If you are struggling to swing your arm, have someone gently swing it for you. When you do this exercise for the first time, swing your arm at a 15 degree angle for 15 seconds, or swing your arm 10 times. As pain lessens over time, increase the angle of the swing to 30-45 degrees. 5. Repeat steps 1-4 with the other arm. How To Do This Exercise While Standing 1. Stand next to a sturdy chair or table and hold on to it with your hand.  Bend forward at the waist.  Bend your knees slightly.  Relax your other arm and let it hang limp.  Relax the shoulder blade of the arm that is hanging and let it drop.  While keeping your shoulder relaxed, use body motion to swing your arm in small circles. The first time you do this exercise, swing your arm for about 30 seconds or 10 times. When you do it next time, swing your arm for a little longer.  Stand up tall and relax.  Repeat steps 1-7, this time changing the direction of the circles. 2. Repeat steps 1-8 with the other arm. STRETCHING EXERCISES Do these exercises 3-4 times per day on 5-6 days per week or as told by your health care provider. Work toward holding the stretch for 20 seconds. Stretching Exercise 1 1. Lift your arm straight out in front of you. 2. Bend your arm 90  degrees at the elbow (right angle) so your forearm goes across your body and looks like the letter "L." 3. Use your other arm to gently pull the elbow forward and across your body. 4. Repeat steps 1-3 with the other arm. Stretching Exercise 2 You will need a towel or rope for this exercise. 1. Bend one arm behind your back with the palm facing outward. 2. Hold a towel with your other hand. 3. Reach the arm that holds the towel above your head, and bend that arm at the elbow. Your wrist should be behind your neck. 4. Use your free hand to grab the free end of the towel. 5. With the higher hand, gently pull the towel up behind you. 6. With the lower hand, pull the towel down behind you. 7. Repeat steps 1-6 with the other arm.

## 2015-12-22 NOTE — Progress Notes (Signed)
  Subjective:   Patient ID: Evan Smith, male    DOB: 11/17/1955, 60 y.o.   MRN: [id number] CC: Follow-up (4 week) and Shoulder Pain  HPI: Evan Smith is a 60 y.o. male presenting for Follow-up (4 week) and Shoulder Pain  L shoulder started hurting yesterday H/o arthritis in shoulder Had shoulder surgery, had rotator cuff tear R handed Throbbing most of the time Took last meloxicam today  L hip improved No problems last couple of weeks  BMI elevated: seen earlier this week to discuss diet changes with Tammy Starting new changes  LE edema much improved with low dose lasix  Relevant past medical, surgical, family and social history reviewed. Allergies and medications reviewed and updated. History  Smoking Status  . Former Smoker  . Packs/day: 0.50  . Years: 25.00  . Types: Cigarettes  . Quit date: 02/12/2004  Smokeless Tobacco  . Never Used   ROS: Per HPI   Objective:    BP 136/84   Pulse 69   Temp 97.6 F (36.4 C) (Oral)   Ht 5\' 8"  (1.727 m)   Wt (!) 304 lb 9.6 oz (138.2 kg)   BMI 46.31 kg/m   Wt Readings from Last 3 Encounters:  12/22/15 (!) 304 lb 9.6 oz (138.2 kg)  12/19/15 (!) 302 lb (137 kg)  11/24/15 (!) 305 lb 9.6 oz (138.6 kg)    Gen: NAD, alert, cooperative with exam, NCAT EYES: EOMI, no conjunctival injection, or no icterus CV: NRRR, normal S1/S2, no murmur, distal pulses 2+ b/l Resp: CTABL, no wheezes, normal WOB Ext: No edema, warm Neuro: Alert and oriented, strength equal b/l UE and LE, coordination grossly normal MSK: ROM R shoulder normal, slightly decreased L shoulder abduction compared with R  Assessment & Plan:  Evan Smith was seen today for follow-up and shoulder pain.  Diagnoses and all orders for this visit:  Pain in joint of left shoulder Rest, Ice, NSAIDs Gentle ROM exercises Let me know if not improving -     naproxen (NAPROSYN) 500 MG tablet; Take 1 tablet (500 mg total) by mouth 2 (two) times daily with a meal.   Follow  up plan: 3 mo Rex Krasarol Vincent, [facility]

## 2016-01-13 ENCOUNTER — Other Ambulatory Visit: Payer: Self-pay | Admitting: Nurse Practitioner

## 2016-01-13 DIAGNOSIS — K2100 Gastro-esophageal reflux disease with esophagitis, without bleeding: Secondary | ICD-10-CM

## 2016-01-31 ENCOUNTER — Other Ambulatory Visit: Payer: Self-pay | Admitting: Nurse Practitioner

## 2016-01-31 DIAGNOSIS — I10 Essential (primary) hypertension: Secondary | ICD-10-CM

## 2016-02-01 ENCOUNTER — Telehealth: Payer: Self-pay | Admitting: Nurse Practitioner

## 2016-02-01 DIAGNOSIS — M25512 Pain in left shoulder: Secondary | ICD-10-CM

## 2016-02-01 MED ORDER — NAPROXEN 500 MG PO TABS: 500.0000 mg | ORAL_TABLET | Freq: Two times a day (BID) | ORAL | 1 refills | Status: DC

## 2016-02-01 MED ORDER — CYCLOBENZAPRINE HCL 10 MG PO TABS: 10.0000 mg | ORAL_TABLET | Freq: Every evening | ORAL | 1 refills | Status: DC | PRN

## 2016-02-01 NOTE — Telephone Encounter (Signed)
Pt is having continued back pain Naprosyn did help but pt is out Please review and advise

## 2016-02-01 NOTE — Telephone Encounter (Signed)
Back bothering him more. H/o DDD, has had lower back surgery in the past. On the way to hospital, son was in an accident. Will refill naprosyn, flexeril. Needs to be seen for further refills, due for Cr check while on NSAIDs.

## 2016-02-01 NOTE — Telephone Encounter (Signed)
Called back, no answer. When was he taken off of naprosyn? Also I have seen him for shoulder pain and hip pain, but not back pain, is the back pain something new?

## 2016-02-21 ENCOUNTER — Encounter: Payer: Self-pay | Admitting: Family Medicine

## 2016-02-21 ENCOUNTER — Ambulatory Visit (INDEPENDENT_AMBULATORY_CARE_PROVIDER_SITE_OTHER): Payer: Medicare HMO | Admitting: Family Medicine

## 2016-02-21 VITALS — BP 122/78 | HR 89 | Temp 97.3°F | Ht 68.0 in | Wt 297.8 lb

## 2016-02-21 DIAGNOSIS — R091 Pleurisy: Secondary | ICD-10-CM

## 2016-02-21 MED ORDER — BETAMETHASONE SOD PHOS & ACET 6 (3-3) MG/ML IJ SUSP
6.0000 mg | Freq: Once | INTRAMUSCULAR | Status: AC
  Administered 2016-02-21: 6 mg via INTRAMUSCULAR

## 2016-02-21 NOTE — Progress Notes (Signed)
Subjective:  Patient ID: Evan Smith, male    DOB: Oct 05, 1955  Age: 61 y.o. MRN: [id number]  CC: Flank Pain (bilateral. Patient states it started this morning)   HPI Evan Smith presents for Left flank pain started this morning. He says it's 10 out of 10 when he takes a deep breath. He points to approximately rib 8 through 10 at the posterior axillary line on the left. Additionally, this afternoon pain started on the right also at the posterior axillary line. It is less severe when he takes a deep breath it is quite severe. There is no nausea vomiting or cough. There is no dysuria or frequency of urination. He has no fever chills or sweats. No previous symptoms similarly. No history of kidney stone.   History Moody has a past medical history of Arthritis; DDD (degenerative disc disease); GERD (gastroesophageal reflux disease); Hyperlipidemia; Hypertension; OSA on CPAP; Perianal fistula; and PONV (postoperative nausea and vomiting).   He has a past surgical history that includes Shoulder arthroscopy with rotator cuff repair (Left, 1990's); Incision and drainage perirectal abscess (02/13/2012); RIGHT HYDROCELECTOMY (07-28-2001); Lumbar fusion (2010); Cervical fusion (1990's); and Exam under anesthesia with anal fistulectomy (N/A, 08/03/2012).   His family history includes Brain cancer in his mother; Diabetes in his mother and sister; Heart disease in his brother and sister; Kidney Stones in his father; Lymphoma in his brother.He reports that he quit smoking about 12 years ago. His smoking use included Cigarettes. He has a 12.50 pack-year smoking history. He has never used smokeless tobacco. He reports that he drinks alcohol. He reports that he does not use drugs.    ROS Review of Systems  Constitutional: Negative for chills, diaphoresis and fever.  HENT: Negative for rhinorrhea and sore throat.   Respiratory: Negative for cough and shortness of breath.   Cardiovascular: Negative for  chest pain.  Gastrointestinal: Negative for abdominal pain.  Genitourinary: Positive for flank pain.  Musculoskeletal: Negative for arthralgias and myalgias.  Skin: Negative for rash.  Neurological: Negative for weakness and headaches.    Objective:  BP 122/78   Pulse 89   Temp 97.3 F (36.3 C) (Oral)   Ht 5\' 8"  (1.727 m)   Wt 297 lb 12.8 oz (135.1 kg)   BMI 45.28 kg/m   BP Readings from Last 3 Encounters:  02/21/16 122/78  12/22/15 136/84  12/19/15 138/82    Wt Readings from Last 3 Encounters:  02/21/16 297 lb 12.8 oz (135.1 kg)  12/22/15 (!) 304 lb 9.6 oz (138.2 kg)  12/19/15 (!) 302 lb (137 kg)     Physical Exam  Constitutional: He is oriented to person, place, and time. He appears well-developed and well-nourished. No distress.  HENT:  Head: Normocephalic and atraumatic.  Right Ear: External ear normal.  Left Ear: External ear normal.  Nose: Nose normal.  Mouth/Throat: Oropharynx is clear and moist.  Eyes: Conjunctivae and EOM are normal. Pupils are equal, round, and reactive to light.  Neck: Normal range of motion. Neck supple. No thyromegaly present.  Cardiovascular: Normal rate, regular rhythm and normal heart sounds.   No murmur heard. Pulmonary/Chest: Effort normal and breath sounds normal. No respiratory distress. He has no wheezes. He has no rales. He exhibits tenderness (at the posterior axillary line bilaterally at the costal margin and superiorly to approximately rib9).  Abdominal: Soft. Bowel sounds are normal. He exhibits no distension. There is no tenderness.  Lymphadenopathy:    He has no cervical adenopathy.  Neurological:  He is alert and oriented to person, place, and time. He has normal reflexes.  Skin: Skin is warm and dry.  Psychiatric: He has a normal mood and affect. His behavior is normal. Judgment and thought content normal.    No results found.  Assessment & Plan:   Pervis Hockingayton was seen today for flank pain.  Diagnoses and all orders  for this visit:  Pleurisy -     betamethasone acetate-betamethasone sodium phosphate (CELESTONE) injection 6 mg; Inject 1 mL (6 mg total) into the muscle once.      I am having Mr. Electa SniffBarnett maintain his aspirin, Vitamin D3, Potassium Gluconate, ibuprofen, docusate sodium, simvastatin, losartan-hydrochlorothiazide, gabapentin, cetirizine, fluticasone, furosemide, metFORMIN, omeprazole, amLODipine, naproxen, and cyclobenzaprine. We administered betamethasone acetate-betamethasone sodium phosphate.  Allergies as of 02/21/2016      Reactions   Hydrocodeine [dihydrocodeine] Nausea And Vomiting      Medication List       Accurate as of 02/21/16  8:22 PM. Always use your most recent med list.          amLODipine 5 MG tablet Commonly known as:  NORVASC TAKE ONE TABLET BY MOUTH ONCE DAILY   aspirin 81 MG tablet Take 81 mg by mouth daily.   cetirizine 10 MG tablet Commonly known as:  ZYRTEC Take 1 tablet (10 mg total) by mouth daily.   cyclobenzaprine 10 MG tablet Commonly known as:  FLEXERIL Take 1 tablet (10 mg total) by mouth at bedtime as needed for muscle spasms.   docusate sodium 100 MG capsule Commonly known as:  COLACE Take 200 mg by mouth daily as needed for mild constipation.   fluticasone 50 MCG/ACT nasal spray Commonly known as:  FLONASE Place 2 sprays into both nostrils daily.   furosemide 20 MG tablet Commonly known as:  LASIX Take 1 tablet (20 mg total) by mouth daily.   gabapentin 300 MG capsule Commonly known as:  NEURONTIN Take 1 capsule (300 mg total) by mouth at bedtime.   ibuprofen 200 MG tablet Commonly known as:  ADVIL,MOTRIN Take 200 mg by mouth 2 (two) times daily as needed.   losartan-hydrochlorothiazide 100-25 MG tablet Commonly known as:  HYZAAR TAKE ONE TABLET BY MOUTH ONCE DAILY   metFORMIN 500 MG 24 hr tablet Commonly known as:  GLUCOPHAGE XR Take 1 tablet (500 mg total) by mouth daily with breakfast.   naproxen 500 MG  tablet Commonly known as:  NAPROSYN Take 1 tablet (500 mg total) by mouth 2 (two) times daily with a meal.   omeprazole 40 MG capsule Commonly known as:  PRILOSEC TAKE ONE CAPSULE BY MOUTH ONCE DAILY IN THE MORNING   Potassium Gluconate 80 MG Tabs Take 99 mg by mouth.   simvastatin 40 MG tablet Commonly known as:  ZOCOR Take 1 tablet (40 mg total) by mouth at bedtime.   Vitamin D3 1000 units Caps Take 2 capsules by mouth daily.        Follow-up: Return if symptoms worsen or fail to improve.  Mechele ClaudeWarren Jermaine Tholl, M.D.

## 2016-02-28 DIAGNOSIS — G4733 Obstructive sleep apnea (adult) (pediatric): Secondary | ICD-10-CM | POA: Diagnosis not present

## 2016-03-06 ENCOUNTER — Other Ambulatory Visit: Payer: Self-pay | Admitting: Pediatrics

## 2016-03-06 DIAGNOSIS — M25512 Pain in left shoulder: Secondary | ICD-10-CM

## 2016-03-12 ENCOUNTER — Ambulatory Visit: Payer: Self-pay | Admitting: Pharmacist

## 2016-03-21 ENCOUNTER — Ambulatory Visit (INDEPENDENT_AMBULATORY_CARE_PROVIDER_SITE_OTHER): Payer: Medicare HMO | Admitting: Pharmacist

## 2016-03-21 ENCOUNTER — Encounter: Payer: Self-pay | Admitting: Pharmacist

## 2016-03-21 VITALS — BP 130/70 | HR 72 | Ht 68.0 in | Wt 303.0 lb

## 2016-03-21 DIAGNOSIS — Z0000 Encounter for general adult medical examination without abnormal findings: Secondary | ICD-10-CM

## 2016-03-21 DIAGNOSIS — IMO0001 Reserved for inherently not codable concepts without codable children: Secondary | ICD-10-CM

## 2016-03-21 DIAGNOSIS — Z6841 Body Mass Index (BMI) 40.0 and over, adult: Secondary | ICD-10-CM | POA: Diagnosis not present

## 2016-03-21 NOTE — Patient Instructions (Addendum)
  Mr. Electa SniffBarnett , Thank you for taking time to come for your Medicare Wellness Visit. I appreciate your ongoing commitment to your health goals. Please review the following plan we discussed and let me know if I can assist you in the future.   These are the goals we discussed:  Increase non-starchy vegetables - carrots, green bean, squash, zucchini, tomatoes, onions, peppers, spinach and other green leafy vegetables, cabbage, lettuce, cucumbers, asparagus, okra (not fried), eggplant Limit sugar and processed foods (cakes, cookies, ice cream, crackers and chips) Increase fresh fruit but limit serving sizes 1/2 cup or about the size of tennis or baseball Limit red meat to no more than 1-2 times per week (serving size about the size of your palm) Choose whole grains / lean proteins - whole wheat bread, quinoa, whole grain rice (1/2 cup), fish, chicken, [place]urkey Avoid sugar and calorie containing beverages - soda, sweet tea and juice.  Choose water or unsweetened tea instead. Try Pepsi lite in place of regular soda (green can and has 60 calories).  Eventually try to stop soda all together.   Increase exercise - goal is 150 minutes per week - [facility].     This is a list of the screening recommended for you and due dates:  Health Maintenance  Topic Date Due  . Shingles Vaccine  05/19/2014  . Tetanus Vaccine  05/26/2016  . Stool Blood Test  08/02/2016  . Flu Shot  Completed  .  Hepatitis C: One time screening is recommended by Center for Disease Control  (CDC) for  adults born from [birth year] through 1966.   Completed  . HIV Screening  Completed

## 2016-03-21 NOTE — Progress Notes (Signed)
Patient ID: Evan Calkinayton W Eccleston, male   DOB: 03/06/1955, 61 y.o.   MRN: [id number]    Subjective:   Evan Smith is a 61 y.o. black, male who presents for a subsequent Medicare Annual Wellness Visit.  Evan Smith is married and lives with his wife in [place], [place]. 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  He has 2 grown children.    He mentions that he received a letter from his insurance that they would no longer cover gabapentin and he has not taken in about 2 weeks. He states that he has experienced a little increase in nerve related pain.   Review of Systems  Review of Systems  Constitutional: Negative.   HENT: Negative.   Eyes: Negative.   Respiratory: Negative.   Cardiovascular: Negative.   Gastrointestinal: Negative.   Genitourinary: Negative.   Musculoskeletal: Negative for back pain, falls, joint pain and myalgias.       Nerve pain / numbness  Skin: Negative.   Neurological: Negative.   Endo/Heme/Allergies: Negative.   Psychiatric/Behavioral: Negative.       Current Medications (verified) Outpatient Encounter Prescriptions as of 03/21/2016  Medication Sig  . amLODipine (NORVASC) 5 MG tablet TAKE ONE TABLET BY MOUTH ONCE DAILY  . aspirin 81 MG tablet Take 81 mg by mouth daily.  . cetirizine (ZYRTEC) 10 MG tablet Take 1 tablet (10 mg total) by mouth daily.  . Cholecalciferol (VITAMIN D3) 1000 UNITS CAPS Take 1 capsule by mouth daily.   . diphenhydrAMINE (BENADRYL) 25 MG tablet Take 25 mg by mouth at bedtime as needed.  Marland Kitchen. ibuprofen (ADVIL,MOTRIN) 200 MG tablet Take 200 mg by mouth 2 (two) times daily as needed.  Marland Kitchen. losartan-hydrochlorothiazide (HYZAAR) 100-25 MG tablet TAKE ONE TABLET BY MOUTH ONCE DAILY  . metFORMIN (GLUCOPHAGE XR) 500 MG 24 hr tablet Take 1 tablet (500 mg total) by mouth daily with breakfast.  . naproxen (NAPROSYN) 500 MG tablet TAKE ONE TABLET BY MOUTH TWICE DAILY WITH A MEAL  . omeprazole (PRILOSEC) 40 MG capsule TAKE  ONE CAPSULE BY MOUTH ONCE DAILY IN THE MORNING  . Potassium Gluconate 80 MG TABS Take 99 mg by mouth.  . simvastatin (ZOCOR) 40 MG tablet Take 1 tablet (40 mg total) by mouth at bedtime.  . cyclobenzaprine (FLEXERIL) 10 MG tablet Take 1 tablet (10 mg total) by mouth at bedtime as needed for muscle spasms. (Patient not taking: Reported on 03/21/2016)  . docusate sodium (COLACE) 100 MG capsule Take 200 mg by mouth daily as needed for mild constipation.  . fluticasone (FLONASE) 50 MCG/ACT nasal spray Place 2 sprays into both nostrils daily. (Patient not taking: Reported on 03/21/2016)  . furosemide (LASIX) 20 MG tablet Take 1 tablet (20 mg total) by mouth daily. (Patient not taking: Reported on 03/21/2016)  . [DISCONTINUED] gabapentin (NEURONTIN) 300 MG capsule Take 1 capsule (300 mg total) by mouth at bedtime. (Patient not taking: Reported on 03/21/2016)   No facility-administered encounter medications on file as of 03/21/2016.     Allergies (verified) Hydrocodeine [dihydrocodeine]   History: Past Medical History:  Diagnosis Date  . Arthritis   . Cataract   . DDD (degenerative disc disease)   . GERD (gastroesophageal reflux disease)   . Hyperlipidemia   . Hypertension   . OSA on CPAP    MODERATE OSA PER STUDY 2010  . Perianal fistula   . PONV (postoperative nausea and vomiting)    and hear to wake  Past Surgical History:  Procedure Laterality Date  . CERVICAL FUSION  1990's  . EVALUATION UNDER ANESTHESIA WITH ANAL FISTULECTOMY N/A 08/03/2012   Procedure: EXAM UNDER ANESTHESIA WITH ANAL FISTULECTOMY ;  Surgeon: Romie Levee, MD;  Location: [facility] [place];  Service: General;  Laterality: N/A;  . INCISION AND DRAINAGE PERIRECTAL ABSCESS  02/13/2012   Procedure: IRRIGATION AND DEBRIDEMENT PERIRECTAL ABSCESS;  Surgeon: Velora Heckler, MD;  Location: [facility] OR;  Service: General;  Laterality: N/A;  . LUMBAR FUSION  2010  . RIGHT HYDROCELECTOMY  07-28-2001  . SHOULDER ARTHROSCOPY WITH  ROTATOR CUFF REPAIR Left 1990's   Family History  Problem Relation Age of Onset  . Brain cancer Mother   . Diabetes Mother   . Kidney Stones Father   . Diabetes Sister   . Heart disease Sister   . Lymphoma Brother   . Heart disease Brother    Social History   Occupational History  . Not on file.   Social History Main Topics  . Smoking status: Former Smoker    Packs/day: 0.50    Years: 25.00    Types: Cigarettes    Quit date: 02/12/2004  . Smokeless tobacco: Never Used  . Alcohol use Yes     Comment: occassional  . Drug use: No  . Sexual activity: Yes    Do you feel safe at home?  Yes  Dietary issues and exercise activities discussed: Current Exercise Habits: The patient does not participate in regular exercise at present;Home exercise routine, Type of exercise: walking, Time (Minutes): 20, Frequency (Times/Week): 2, Weekly Exercise (Minutes/Week): 40, Intensity: Mild  Current Dietary habits:   Since last year has decreased bread intake and is trying to limit amount of other CHO like potatoes and rice.  gafd Beverages - water; 1 soda per day (pepsi)  Cardiac Risk Factors include: advanced age (>71men, >70 women);dyslipidemia;hypertension;male gender;obesity (BMI >30kg/m2);sedentary lifestyle  Objective:    Today's Vitals   03/21/16 1517  BP: 130/70  Pulse: 72  Weight: (!) 303 lb (137.4 kg)  Height: 5\' 8"  (1.727 m)  PainSc: 2   PainLoc: Generalized   Body mass index is 46.07 kg/m.   Weight at last years AWV was 308# and BMI was 46.83   Activities of Daily Living In your present state of health, do you have any difficulty performing the following activities: 03/21/2016  Hearing? N  Vision? N  Difficulty concentrating or making decisions? N  Walking or climbing stairs? N  Dressing or bathing? N  Doing errands, shopping? N  Preparing Food and eating ? N  Using the Toilet? N  In the past six months, have you accidently leaked urine? N  Do you have problems  with loss of bowel control? N  Managing your Medications? N  Managing your Finances? N  Housekeeping or managing your Housekeeping? N  Some recent data might be hidden    Are there smokers in your home (other than you)? No    Depression Screen PHQ 2/9 Scores 03/21/2016 02/21/2016 12/22/2015 11/24/2015  PHQ - 2 Score 0 0 0 0    Fall Risk Fall Risk  03/21/2016 02/21/2016 12/22/2015 11/24/2015 07/07/2015  Falls in the past year? No No No No No  Number falls in past yr: - - - - -  Injury with Fall? - - - - -    Cognitive Function: MMSE - Mini Mental State Exam 03/21/2016 03/09/2015  Orientation to time 5 5  Orientation to Place 5 5  Registration 3 3  Attention/ Calculation 3 4  Recall 3 3  Language- name 2 objects 2 2  Language- repeat 1 1  Language- follow 3 step command 3 3  Language- read & follow direction 1 1  Write a sentence 1 1  Copy design 1 1  Total score 28 29    Immunizations and Health Maintenance Immunization History  Administered Date(s) Administered  . Influenza,inj,Quad PF,36+ Mos 11/24/2013, 01/13/2015, 11/24/2015   Health Maintenance Due  Topic Date Due  . ZOSTAVAX  05/19/2014    Patient Care Team: Bennie Pierini, FNP as PCP - General (Family Medicine) Darnell Level, MD as Consulting Physician (General Surgery)  Indicate any recent Medical Services you may have received from other than [facility] providers in the past year (date may be approximate).    Assessment:    Annual Wellness Visit  Medication Management Obesity / Pre Diabetes   Screening Tests Health Maintenance  Topic Date Due  . ZOSTAVAX  05/19/2014  . TETANUS/TDAP  05/26/2016  . COLON CANCER SCREENING ANNUAL FOBT  08/02/2016  . INFLUENZA VACCINE  Completed  . Hepatitis C Screening  Completed  . HIV Screening  Completed        Plan:   During the course of the visit Evan Smith was educated and counseled about the following appropriate screening and preventive services:   Influenza  is UTD;  TDap is UTD  Zostavax ($202.00) is due but patient declines due to cost  Colorectal cancer screening -  FOBT is UTD  Cardiovascular disease screening - EKG last 2017;  Last lipids at goal and patient is on statin  BP at goal today  Diabetes screening - last FBG was 98.  A1c = 6.1% (11/24/2015).  Patient has prediabetes and is taking metformin 500mg  qd  Nutrition counseling - patient is commended on limiting caloric and CHO intake.  He is to continue to work to limit / discontinue calorie containing beverages.  Will continue to work toward weight loss.  Goal set to decrease weight by 5# over the next month.   Called his pharmacy and reviewed his drug formulary for 2018.  It appears that gabapentin, lyrica, amitriptyline and duloxetine are all tier 3 on his insurance which mean they would cost $45 per month and be subject to initial $200 deductible.  Patient has decided that he does not feel that he sees enough benefit from gabapentin to continue to take at that cost.He is advised to call office for Rx if he changes his mind.    Advanced directive packet given to patient and reviewed.  Advised to increase exercise - goal is 150 minutes per week.  Suggested he check out [place] [employer] program at [facility]  F/U as planned with PCP in 1 month  No orders of the defined types were placed in this encounter.   Patient Instructions (the written plan) were given to the patient.   Evan Smith, PharmD   03/22/2016        Patient ID: Evan Calkin, male   DOB: 30-Mar-1955, 61 y.o.   MRN: [id number]

## 2016-03-22 ENCOUNTER — Encounter: Payer: Self-pay | Admitting: Pharmacist

## 2016-03-26 ENCOUNTER — Other Ambulatory Visit: Payer: Self-pay | Admitting: Pharmacist

## 2016-04-04 ENCOUNTER — Encounter: Payer: Self-pay | Admitting: Nurse Practitioner

## 2016-04-04 ENCOUNTER — Ambulatory Visit (INDEPENDENT_AMBULATORY_CARE_PROVIDER_SITE_OTHER): Payer: Medicare HMO | Admitting: Nurse Practitioner

## 2016-04-04 VITALS — BP 121/78 | HR 71 | Temp 97.2°F | Ht 68.0 in | Wt 304.0 lb

## 2016-04-04 DIAGNOSIS — G609 Hereditary and idiopathic neuropathy, unspecified: Secondary | ICD-10-CM | POA: Diagnosis not present

## 2016-04-04 DIAGNOSIS — I10 Essential (primary) hypertension: Secondary | ICD-10-CM

## 2016-04-04 DIAGNOSIS — E785 Hyperlipidemia, unspecified: Secondary | ICD-10-CM | POA: Diagnosis not present

## 2016-04-04 DIAGNOSIS — G4733 Obstructive sleep apnea (adult) (pediatric): Secondary | ICD-10-CM | POA: Diagnosis not present

## 2016-04-04 DIAGNOSIS — K2100 Gastro-esophageal reflux disease with esophagitis, without bleeding: Secondary | ICD-10-CM

## 2016-04-04 DIAGNOSIS — G5601 Carpal tunnel syndrome, right upper limb: Secondary | ICD-10-CM | POA: Diagnosis not present

## 2016-04-04 DIAGNOSIS — E782 Mixed hyperlipidemia: Secondary | ICD-10-CM

## 2016-04-04 DIAGNOSIS — E8881 Metabolic syndrome: Secondary | ICD-10-CM

## 2016-04-04 DIAGNOSIS — R7303 Prediabetes: Secondary | ICD-10-CM | POA: Diagnosis not present

## 2016-04-04 LAB — BAYER DCA HB A1C WAIVED: HB A1C: 5.8 % (ref <7.0)

## 2016-04-04 MED ORDER — LOSARTAN POTASSIUM-HCTZ 100-25 MG PO TABS: ORAL_TABLET | ORAL | 1 refills | Status: DC

## 2016-04-04 MED ORDER — OMEPRAZOLE 40 MG PO CPDR: DELAYED_RELEASE_CAPSULE | ORAL | 1 refills | Status: DC

## 2016-04-04 MED ORDER — AMLODIPINE BESYLATE 5 MG PO TABS: 5.0000 mg | ORAL_TABLET | Freq: Every day | ORAL | 1 refills | Status: DC

## 2016-04-04 MED ORDER — SIMVASTATIN 40 MG PO TABS: 40.0000 mg | ORAL_TABLET | Freq: Every day | ORAL | 1 refills | Status: DC

## 2016-04-04 MED ORDER — METFORMIN HCL ER 500 MG PO TB24: 500.0000 mg | ORAL_TABLET | Freq: Every day | ORAL | 1 refills | Status: DC

## 2016-04-04 NOTE — Patient Instructions (Signed)

## 2016-04-04 NOTE — Progress Notes (Signed)
Subjective:    Patient ID: Evan Smith, male    DOB: [birth year], 61 y.o.   MRN: [id number]   Patient here today for follow up of chronic medical problems. NO changes since last visit. No complaints today. Not sure he takes medicaine as rx daily because according to chart he should have ran out of several meds months ago.  Outpatient Encounter Prescriptions as of 04/04/2016  Medication Sig  . amLODipine (NORVASC) 5 MG tablet TAKE ONE TABLET BY MOUTH ONCE DAILY  . aspirin 81 MG tablet Take 81 mg by mouth daily.  . cetirizine (ZYRTEC) 10 MG tablet Take 1 tablet (10 mg total) by mouth daily.  . Cholecalciferol (VITAMIN D3) 1000 UNITS CAPS Take 1 capsule by mouth daily.   . cyclobenzaprine (FLEXERIL) 10 MG tablet Take 1 tablet (10 mg total) by mouth at bedtime as needed for muscle spasms.  . furosemide (LASIX) 20 MG tablet Take 1 tablet (20 mg total) by mouth daily.  Marland Kitchen ibuprofen (ADVIL,MOTRIN) 200 MG tablet Take 200 mg by mouth 2 (two) times daily as needed.  Marland Kitchen losartan-hydrochlorothiazide (HYZAAR) 100-25 MG tablet TAKE ONE TABLET BY MOUTH ONCE DAILY  . metFORMIN (GLUCOPHAGE-XR) 500 MG 24 hr tablet TAKE ONE TABLET BY MOUTH ONCE DAILY WITH BREAKFAST  . naproxen (NAPROSYN) 500 MG tablet TAKE ONE TABLET BY MOUTH TWICE DAILY WITH A MEAL  . omeprazole (PRILOSEC) 40 MG capsule TAKE ONE CAPSULE BY MOUTH ONCE DAILY IN THE MORNING  . Potassium Gluconate 80 MG TABS Take 99 mg by mouth.  . simvastatin (ZOCOR) 40 MG tablet Take 1 tablet (40 mg total) by mouth at bedtime.     Hypertension  This is a chronic problem. The current episode started more than 1 year ago. The problem is controlled. Pertinent negatives include no chest pain. Risk factors for coronary artery disease include dyslipidemia and obesity. Past treatments include angiotensin blockers and diuretics. The current treatment provides moderate improvement. Compliance problems include diet and exercise.  There is no history of CAD/MI or CVA.    Hyperlipidemia  This is a chronic problem. The current episode started more than 1 year ago. The problem is resistant. Recent lipid tests were reviewed and are variable. Pertinent negatives include no chest pain or myalgias. Current antihyperlipidemic treatment includes statins. The current treatment provides moderate improvement of lipids. Compliance problems include adherence to diet and adherence to exercise.  Risk factors for coronary artery disease include dyslipidemia, male sex and obesity.  GERD Patient currently on omeprazole which works well for symptoms Neuropathy of shoulders and back Neurotin 350m- does not take everyday- only takes couple of times a week. ALso takes mobis daily for neck pain. Metabolic syndrome Blood sugars have been slightly elevated the last several times labs have been checked, but patient does not check blood sugars. His hbga1c has been below 6. OSA Is suppose to use CPAP nightly- does not use every night. Feels better when he uses as directed. Carpal Tunnel syndrome Numbness and tingling in thumb, index and middle finger on right- worse when he is at work and when he is sleeping.   Review of Systems  Constitutional: Negative.   HENT: Negative.   Respiratory: Negative.   Cardiovascular: Negative for chest pain.  Gastrointestinal: Negative.   Genitourinary: Negative.   Musculoskeletal: Negative for myalgias.  Neurological: Negative.   Psychiatric/Behavioral: Negative.        Objective:   Physical Exam  Constitutional: He is oriented to person, place, and time. He  appears well-developed and well-nourished.  HENT:  Head: Normocephalic.  Right Ear: External ear normal.  Left Ear: External ear normal.  Nose: Nose normal.  Mouth/Throat: Oropharynx is clear and moist.  Eyes: EOM are normal. Pupils are equal, round, and reactive to light.  Neck: Normal range of motion. Neck supple. No JVD present. No thyromegaly present.  Cardiovascular: Normal rate,  regular rhythm, normal heart sounds and intact distal pulses.  Exam reveals no gallop and no friction rub.   No murmur heard. Pulmonary/Chest: Effort normal and breath sounds normal. No respiratory distress. He has no wheezes. He has no rales. He exhibits no tenderness.  Abdominal: Soft. Bowel sounds are normal. He exhibits no mass. There is no tenderness.  Soft nontender umbilical hernia   Musculoskeletal: Normal range of motion. He exhibits no edema.  (+) phalen  Right (+) tinel right Grips equal bil  Lymphadenopathy:    He has no cervical adenopathy.  Neurological: He is alert and oriented to person, place, and time. No cranial nerve deficit.  Skin: Skin is warm and dry.  Psychiatric: He has a normal mood and affect. His behavior is normal. Judgment and thought content normal.   BP 121/78   Pulse 71   Temp 97.2 F (36.2 C) (Oral)   Ht '5\' 8"'  (1.727 m)   Wt (!) 304 lb (137.9 kg)   BMI 46.22 kg/m      Assessment & Plan:  1. Essential hypertension Low sodium diet - CMP14+EGFR  2. Hyperlipidemia, unspecified hyperlipidemia type Low fat diet - Lipid panel  3. Hereditary and idiopathic peripheral neuropathy  4. Metabolic syndrome Watch careb sin diet - Bayer DCA Hb A1c Waived - Microalbumin / creatinine urine ratio - metFORMIN (GLUCOPHAGE-XR) 500 MG 24 hr tablet; Take 1 tablet (500 mg total) by mouth daily with breakfast.  Dispense: 90 tablet; Refill: 1  5. Severe obesity (BMI >= 40) (HCC) Discussed diet and exercise for person with BMI >25 Will recheck weight in 3-6 months  6. Obstructive sleep apnea syndrome Use CPAP nightly as indicated  7. Essential hypertension, benign Low sodium diet - losartan-hydrochlorothiazide (HYZAAR) 100-25 MG tablet; TAKE ONE TABLET BY MOUTH ONCE DAILY  Dispense: 90 tablet; Refill: 1 - amLODipine (NORVASC) 5 MG tablet; Take 1 tablet (5 mg total) by mouth daily.  Dispense: 90 tablet; Refill: 1  8. Gastroesophageal reflux disease with  esophagitis Avoid spicy foods Do not eat 2 hours prior to bedtime - omeprazole (PRILOSEC) 40 MG capsule; TAKE ONE CAPSULE BY MOUTH ONCE DAILY IN THE MORNING  Dispense: 90 capsule; Refill: 1  9. Mixed hyperlipidemia - simvastatin (ZOCOR) 40 MG tablet; Take 1 tablet (40 mg total) by mouth at bedtime.  Dispense: 90 tablet; Refill: 1  10. Carpal tunnel syndrome of right wrist Wear wrist brace at work and when sleeping Motrin for pain Patient refuses to see ortho at this time    Labs pending Health maintenance reviewed Diet and exercise encouraged Continue all meds Follow up  In 3 months   Br[place]on, FNP

## 2016-04-05 LAB — LIPID PANEL
Chol/HDL Ratio: 2.8 (ref 0.0–5.0)
Cholesterol, Total: 146 mg/dL (ref 100–199)
HDL: 52 mg/dL (ref >39)
LDL Calculated: 71 (ref 0–99)
TRIGLYCERIDES: 115 mg/dL (ref 0–149)
VLDL CHOLESTEROL CAL: 23 (ref 5–40)

## 2016-04-05 LAB — CMP14+EGFR
A/G RATIO: 2 (ref 1.2–2.2)
ALT: 25 IU/L (ref 0–44)
AST: 25 IU/L (ref 0–40)
Albumin: 4.4 g/dL (ref 3.6–4.8)
Alkaline Phosphatase: 56 IU/L (ref 39–117)
BILIRUBIN TOTAL: 0.4 mg/dL (ref 0.0–1.2)
BUN/Creatinine Ratio: 11 (ref 10–24)
BUN: 15 mg/dL (ref 8–27)
CALCIUM: 9.5 mg/dL (ref 8.6–10.2)
CHLORIDE: 99 mmol/L (ref 96–106)
CO2: 24 mmol/L (ref 18–29)
Creatinine, Ser: 1.31 mg/dL — ABNORMAL HIGH (ref 0.76–1.27)
GFR, EST AFRICAN AMERICAN: 67 (ref >59)
GFR, EST NON AFRICAN AMERICAN: 58 — AB (ref >59)
GLUCOSE: 108 mg/dL — AB (ref 65–99)
Globulin, Total: 2.2 (ref 1.5–4.5)
POTASSIUM: 4.8 mmol/L (ref 3.5–5.2)
Sodium: 140 mmol/L (ref 134–144)
TOTAL PROTEIN: 6.6 g/dL (ref 6.0–8.5)

## 2016-04-05 LAB — MICROALBUMIN / CREATININE URINE RATIO
CREATININE, UR: 75.5 mg/dL
Microalb/Creat Ratio: <4 mg/g creat (ref 0.0–30.0)

## 2016-04-12 ENCOUNTER — Other Ambulatory Visit: Payer: Self-pay | Admitting: Pediatrics

## 2016-04-12 DIAGNOSIS — M25512 Pain in left shoulder: Secondary | ICD-10-CM

## 2016-07-05 ENCOUNTER — Ambulatory Visit: Payer: Medicare HMO | Admitting: Nurse Practitioner

## 2016-07-18 ENCOUNTER — Other Ambulatory Visit: Payer: Self-pay | Admitting: Nurse Practitioner

## 2016-07-18 DIAGNOSIS — G629 Polyneuropathy, unspecified: Secondary | ICD-10-CM

## 2016-07-23 ENCOUNTER — Other Ambulatory Visit: Payer: Self-pay | Admitting: Nurse Practitioner

## 2016-07-23 DIAGNOSIS — M25512 Pain in left shoulder: Secondary | ICD-10-CM

## 2016-08-08 ENCOUNTER — Ambulatory Visit: Payer: Medicare HMO | Admitting: Nurse Practitioner

## 2016-08-22 ENCOUNTER — Ambulatory Visit (INDEPENDENT_AMBULATORY_CARE_PROVIDER_SITE_OTHER): Payer: Medicare HMO | Admitting: Nurse Practitioner

## 2016-08-22 ENCOUNTER — Encounter: Payer: Self-pay | Admitting: Nurse Practitioner

## 2016-08-22 VITALS — BP 124/70 | HR 87 | Temp 97.7°F | Ht 68.0 in | Wt 307.0 lb

## 2016-08-22 DIAGNOSIS — E782 Mixed hyperlipidemia: Secondary | ICD-10-CM

## 2016-08-22 DIAGNOSIS — E785 Hyperlipidemia, unspecified: Secondary | ICD-10-CM

## 2016-08-22 DIAGNOSIS — K2100 Gastro-esophageal reflux disease with esophagitis, without bleeding: Secondary | ICD-10-CM

## 2016-08-22 DIAGNOSIS — G629 Polyneuropathy, unspecified: Secondary | ICD-10-CM | POA: Diagnosis not present

## 2016-08-22 DIAGNOSIS — I10 Essential (primary) hypertension: Secondary | ICD-10-CM

## 2016-08-22 DIAGNOSIS — E8881 Metabolic syndrome: Secondary | ICD-10-CM

## 2016-08-22 DIAGNOSIS — G609 Hereditary and idiopathic neuropathy, unspecified: Secondary | ICD-10-CM

## 2016-08-22 DIAGNOSIS — R7303 Prediabetes: Secondary | ICD-10-CM | POA: Diagnosis not present

## 2016-08-22 DIAGNOSIS — G4733 Obstructive sleep apnea (adult) (pediatric): Secondary | ICD-10-CM | POA: Diagnosis not present

## 2016-08-22 LAB — BAYER DCA HB A1C WAIVED: HB A1C: 5.9 % (ref <7.0)

## 2016-08-22 MED ORDER — AMLODIPINE BESYLATE 5 MG PO TABS: 5.0000 mg | ORAL_TABLET | Freq: Every day | ORAL | 1 refills | Status: DC

## 2016-08-22 MED ORDER — CETIRIZINE HCL 10 MG PO TABS: 10.0000 mg | ORAL_TABLET | Freq: Every day | ORAL | 11 refills | Status: DC

## 2016-08-22 MED ORDER — GABAPENTIN 300 MG PO CAPS: ORAL_CAPSULE | ORAL | 5 refills | Status: DC

## 2016-08-22 MED ORDER — METFORMIN HCL ER 500 MG PO TB24: 500.0000 mg | ORAL_TABLET | Freq: Every day | ORAL | 1 refills | Status: DC

## 2016-08-22 MED ORDER — LOSARTAN POTASSIUM-HCTZ 100-25 MG PO TABS: ORAL_TABLET | ORAL | 1 refills | Status: DC

## 2016-08-22 MED ORDER — OMEPRAZOLE 40 MG PO CPDR: DELAYED_RELEASE_CAPSULE | ORAL | 1 refills | Status: DC

## 2016-08-22 MED ORDER — SIMVASTATIN 40 MG PO TABS: 40.0000 mg | ORAL_TABLET | Freq: Every day | ORAL | 1 refills | Status: DC

## 2016-08-22 NOTE — Progress Notes (Signed)
Subjective:    Patient ID: Evan Smith, male    DOB: 14-Jan-1956, 61 y.o.   MRN: [id number]  HPI BREYON SIGG is here today for follow up of chronic medical problem.  Outpatient Encounter Prescriptions as of 08/22/2016  Medication Sig  . amLODipine (NORVASC) 5 MG tablet Take 1 tablet (5 mg total) by mouth daily.  Marland Kitchen aspirin 81 MG tablet Take 81 mg by mouth daily.  . cetirizine (ZYRTEC) 10 MG tablet Take 1 tablet (10 mg total) by mouth daily.  . Cholecalciferol (VITAMIN D3) 1000 UNITS CAPS Take 1 capsule by mouth daily.   . cyclobenzaprine (FLEXERIL) 10 MG tablet Take 1 tablet (10 mg total) by mouth at bedtime as needed for muscle spasms.  . furosemide (LASIX) 20 MG tablet Take 1 tablet (20 mg total) by mouth daily.  Marland Kitchen gabapentin (NEURONTIN) 300 MG capsule TAKE ONE CAPSULE BY MOUTH ONCE DAILY AT BEDTIME  . ibuprofen (ADVIL,MOTRIN) 200 MG tablet Take 200 mg by mouth 2 (two) times daily as needed.  Marland Kitchen losartan-hydrochlorothiazide (HYZAAR) 100-25 MG tablet TAKE ONE TABLET BY MOUTH ONCE DAILY  . metFORMIN (GLUCOPHAGE-XR) 500 MG 24 hr tablet Take 1 tablet (500 mg total) by mouth daily with breakfast.  . naproxen (NAPROSYN) 500 MG tablet TAKE ONE TABLET BY MOUTH TWICE DAILY WITH A MEAL  . naproxen (NAPROSYN) 500 MG tablet TAKE 1 TABLET BY MOUTH TWICE DAILY WITH  A  MEAL  . omeprazole (PRILOSEC) 40 MG capsule TAKE ONE CAPSULE BY MOUTH ONCE DAILY IN THE MORNING  . Potassium Gluconate 80 MG TABS Take 99 mg by mouth.  . simvastatin (ZOCOR) 40 MG tablet Take 1 tablet (40 mg total) by mouth at bedtime.   No facility-administered encounter medications on file as of 08/22/2016.     1. Essential hypertension  Blood pressure managed with amlodipine and losartan-HCTZ.    2. Hyperlipidemia, unspecified hyperlipidemia type  Patient taking simvastatin for management.  3. Pre-diabetes  Patient does not check blood sugar regularly.  Last A1C was < 6%.  4. Obstructive sleep apnea syndrome  No new  complaints.  Patient wears CPAP to sleep.  5. Hereditary and idiopathic peripheral neuropathy Symptoms managed with gabapentin.  6. Severe obesity (BMI >= 40) (HCC)  No recent weight gain or loss.  7. Metabolic syndrome  Patient does not check blood sugar at home.  Last A1C was less than 6% in February.    New complaints: None today.    Review of Systems  Constitutional: Negative for activity change, appetite change and fatigue.  Respiratory: Negative for cough, shortness of breath and wheezing.   Cardiovascular: Negative for chest pain and palpitations.  Gastrointestinal: Negative for abdominal distention and abdominal pain.  Neurological: Negative for dizziness, light-headedness and headaches.  All other systems reviewed and are negative.      Objective:   Physical Exam  Constitutional: He is oriented to person, place, and time. He appears well-developed and well-nourished. No distress.  HENT:  Head: Normocephalic.  Right Ear: External ear normal.  Left Ear: External ear normal.  Mouth/Throat: Oropharynx is clear and moist.  Eyes: Pupils are equal, round, and reactive to light.  Neck: Normal range of motion. Neck supple. No JVD present. No thyromegaly present.  Cardiovascular: Normal rate, regular rhythm, normal heart sounds and intact distal pulses.   No murmur heard. Pulmonary/Chest: Effort normal and breath sounds normal. No respiratory distress. He has no wheezes.  Abdominal: Soft. Bowel sounds are normal. He exhibits no  distension. There is no tenderness.  Musculoskeletal: Normal range of motion.  Lymphadenopathy:    He has no cervical adenopathy.  Neurological: He is alert and oriented to person, place, and time.  Skin: Skin is warm and dry.  Psychiatric: He has a normal mood and affect. His behavior is normal. Judgment and thought content normal.   BP 124/70   Pulse 87   Temp 97.7 F (36.5 C) (Oral)   Ht '5\' 8"'  (1.727 m)   Wt (!) 307 lb (139.3 kg)   BMI  46.68 kg/m  A1C: 5.9%      Assessment & Plan:  1. Essential hypertension Low sodium diet - CMP14+EGFR  2. Hyperlipidemia, unspecified hyperlipidemia type Low fta diet - Lipid panel  3. Pre-diabetes Watch carbs in diet - Bayer DCA Hb A1c Waived  4. Obstructive sleep apnea syndrome Wear cpap nightly  5. Hereditary and idiopathic peripheral neuropathy Do not go barefooted  6. Severe obesity (BMI >= 40) (HCC) Discussed diet and exercise for person with BMI >25 Will recheck weight in 3-6 months  7. Metabolic syndrome - metFORMIN (GLUCOPHAGE-XR) 500 MG 24 hr tablet; Take 1 tablet (500 mg total) by mouth daily with breakfast.  Dispense: 90 tablet; Refill: 1  8. Essential hypertension, benign Low sodium diet - amLODipine (NORVASC) 5 MG tablet; Take 1 tablet (5 mg total) by mouth daily.  Dispense: 90 tablet; Refill: 1 - losartan-hydrochlorothiazide (HYZAAR) 100-25 MG tablet; TAKE ONE TABLET BY MOUTH ONCE DAILY  Dispense: 90 tablet; Refill: 1  9. Gastroesophageal reflux disease with esophagitis Avoid spicy foods Do not eat 2 hours prior to bedtime - omeprazole (PRILOSEC) 40 MG capsule; TAKE ONE CAPSULE BY MOUTH ONCE DAILY IN THE MORNING  Dispense: 90 capsule; Refill: 1  10. Mixed hyperlipidemia Low fat diet - simvastatin (ZOCOR) 40 MG tablet; Take 1 tablet (40 mg total) by mouth at bedtime.  Dispense: 90 tablet; Refill: 1  11. Neuropathy - gabapentin (NEURONTIN) 300 MG capsule; TAKE ONE CAPSULE BY MOUTH ONCE DAILY AT BEDTIME  Dispense: 90 capsule; Refill: 5 - Ambulatory referral to [place] pending Health maintenance reviewed Diet and exercise encouraged Continue all meds Follow up  In 3 month   [place], FNP

## 2016-08-22 NOTE — Patient Instructions (Signed)

## 2016-08-23 LAB — CMP14+EGFR
A/G RATIO: 2.1 (ref 1.2–2.2)
ALBUMIN: 4.2 g/dL (ref 3.6–4.8)
ALK PHOS: 54 IU/L (ref 39–117)
ALT: 26 IU/L (ref 0–44)
AST: 28 IU/L (ref 0–40)
BUN / CREAT RATIO: 11 (ref 10–24)
BUN: 17 mg/dL (ref 8–27)
Bilirubin Total: 0.3 mg/dL (ref 0.0–1.2)
CALCIUM: 9.1 mg/dL (ref 8.6–10.2)
CO2: 23 mmol/L (ref 20–29)
Chloride: 104 mmol/L (ref 96–106)
Creatinine, Ser: 1.5 mg/dL — ABNORMAL HIGH (ref 0.76–1.27)
GFR calc Af Amer: 57 mL/min/{1.73_m2} — ABNORMAL LOW (ref >59)
GFR, EST NON AFRICAN AMERICAN: 49 mL/min/{1.73_m2} — AB (ref >59)
GLOBULIN, TOTAL: 2 g/dL (ref 1.5–4.5)
Glucose: 107 mg/dL — ABNORMAL HIGH (ref 65–99)
POTASSIUM: 4.1 mmol/L (ref 3.5–5.2)
SODIUM: 142 mmol/L (ref 134–144)
Total Protein: 6.2 g/dL (ref 6.0–8.5)

## 2016-08-23 LAB — LIPID PANEL
Chol/HDL Ratio: 3 ratio (ref 0.0–5.0)
Cholesterol, Total: 109 mg/dL (ref 100–199)
HDL: 36 mg/dL — ABNORMAL LOW (ref >39)
LDL Calculated: 40 mg/dL (ref 0–99)
TRIGLYCERIDES: 164 mg/dL — AB (ref 0–149)
VLDL Cholesterol Cal: 33 mg/dL (ref 5–40)

## 2016-08-25 ENCOUNTER — Other Ambulatory Visit: Payer: Self-pay | Admitting: Nurse Practitioner

## 2016-08-25 DIAGNOSIS — M25512 Pain in left shoulder: Secondary | ICD-10-CM

## 2016-09-10 DIAGNOSIS — G4733 Obstructive sleep apnea (adult) (pediatric): Secondary | ICD-10-CM | POA: Diagnosis not present

## 2016-09-13 ENCOUNTER — Telehealth: Payer: Self-pay | Admitting: Nurse Practitioner

## 2016-09-13 NOTE — Telephone Encounter (Signed)
He needs to be seen for abx rx. How long have symptoms been going on? If less than 10 days likely virus and antibiotics wont help. Can take flonase, antihistamine such as cetirizine (generaic for zyrtec), use sinus rinses with netipot or normal saline nose spray to help get mucus out of sinuses. If any fevers, SOB or wheezing needs to be seen.

## 2016-09-13 NOTE — Telephone Encounter (Signed)
Please review and advise. Patient of MMM 

## 2016-09-13 NOTE — Telephone Encounter (Signed)
Patient aware he will need to be seen  

## 2016-09-19 DIAGNOSIS — E1142 Type 2 diabetes mellitus with diabetic polyneuropathy: Secondary | ICD-10-CM | POA: Diagnosis not present

## 2016-09-19 DIAGNOSIS — B353 Tinea pedis: Secondary | ICD-10-CM | POA: Diagnosis not present

## 2016-09-19 DIAGNOSIS — M79671 Pain in right foot: Secondary | ICD-10-CM | POA: Diagnosis not present

## 2016-09-19 DIAGNOSIS — M79672 Pain in left foot: Secondary | ICD-10-CM | POA: Diagnosis not present

## 2016-09-19 DIAGNOSIS — B351 Tinea unguium: Secondary | ICD-10-CM | POA: Diagnosis not present

## 2016-10-14 ENCOUNTER — Emergency Department (HOSPITAL_COMMUNITY)
Admission: EM | Admit: 2016-10-14 | Discharge: 2016-10-14 | Disposition: A | Discharge status: Home / Self Care | Payer: Medicare HMO | Attending: Emergency Medicine | Admitting: Emergency Medicine

## 2016-10-14 ENCOUNTER — Emergency Department (HOSPITAL_COMMUNITY): Payer: Medicare HMO

## 2016-10-14 ENCOUNTER — Encounter (HOSPITAL_COMMUNITY): Payer: Self-pay | Admitting: *Deleted

## 2016-10-14 DIAGNOSIS — R110 Nausea: Secondary | ICD-10-CM

## 2016-10-14 DIAGNOSIS — Y92009 Unspecified place in unspecified non-institutional (private) residence as the place of occurrence of the external cause: Secondary | ICD-10-CM

## 2016-10-14 DIAGNOSIS — W0110XA Fall on same level from slipping, tripping and stumbling with subsequent striking against unspecified object, initial encounter: Secondary | ICD-10-CM | POA: Insufficient documentation

## 2016-10-14 DIAGNOSIS — Z79899 Other long term (current) drug therapy: Secondary | ICD-10-CM | POA: Insufficient documentation

## 2016-10-14 DIAGNOSIS — Y92002 Bathroom of unspecified non-institutional (private) residence single-family (private) house as the place of occurrence of the external cause: Secondary | ICD-10-CM | POA: Diagnosis not present

## 2016-10-14 DIAGNOSIS — Z7982 Long term (current) use of aspirin: Secondary | ICD-10-CM | POA: Insufficient documentation

## 2016-10-14 DIAGNOSIS — Y999 Unspecified external cause status: Secondary | ICD-10-CM | POA: Insufficient documentation

## 2016-10-14 DIAGNOSIS — R269 Unspecified abnormalities of gait and mobility: Secondary | ICD-10-CM | POA: Diagnosis not present

## 2016-10-14 DIAGNOSIS — Y939 Activity, unspecified: Secondary | ICD-10-CM | POA: Insufficient documentation

## 2016-10-14 DIAGNOSIS — Z87891 Personal history of nicotine dependence: Secondary | ICD-10-CM | POA: Insufficient documentation

## 2016-10-14 DIAGNOSIS — I10 Essential (primary) hypertension: Secondary | ICD-10-CM | POA: Diagnosis not present

## 2016-10-14 DIAGNOSIS — F10920 Alcohol use, unspecified with intoxication, uncomplicated: Secondary | ICD-10-CM

## 2016-10-14 DIAGNOSIS — S0990XA Unspecified injury of head, initial encounter: Secondary | ICD-10-CM | POA: Diagnosis not present

## 2016-10-14 DIAGNOSIS — W19XXXA Unspecified fall, initial encounter: Secondary | ICD-10-CM

## 2016-10-14 DIAGNOSIS — R69 Illness, unspecified: Secondary | ICD-10-CM | POA: Diagnosis not present

## 2016-10-14 DIAGNOSIS — I6789 Other cerebrovascular disease: Secondary | ICD-10-CM | POA: Diagnosis not present

## 2016-10-14 MED ORDER — ONDANSETRON 4 MG PO TBDP
4.0000 mg | ORAL_TABLET | Freq: Once | ORAL | Status: AC
  Administered 2016-10-14: 4 mg via ORAL
  Filled 2016-10-14: qty 1

## 2016-10-14 MED ORDER — ONDANSETRON HCL 4 MG PO TABS: 4.0000 mg | ORAL_TABLET | Freq: Three times a day (TID) | ORAL | 0 refills | Status: DC | PRN

## 2016-10-14 NOTE — ED Provider Notes (Signed)
[facility]-EMER[facility]ENCY DEPT Provider Note   CSN: [id number] Arrival date & time: 10/14/16  0432   Seen 4:50 AM  History   Chief Complaint Chief Complaint  Patient presents with  . Fall    HPI Evan Smith is a 61 y.o. male.  HPI  Patient presents to the emergency department via EMS. His wife and nephew are here with him. They state they had a family reunion today. Patient states "I did too much partying". Wife states patient drinks daily. She states he joint more than usual tonight. She states she heard him fall in the bathroom and when she went and he was laying between the tub in the toilet. She states his eyes were open and he was just staring and would not make eye contact with her and he was groaning. She states it lasted about 7 minutes. Patient does not remember the fall. He denies hitting his head however his wife feels like he hit his head on the toilet. He now has nausea since he fell. He did not have nausea before. He denies any pain. He denies any numbness or tingling of his extremities, any change in his vision, or any neck pain.  PCP Bennie Pierini, FNP   Past Medical History:  Diagnosis Date  . Arthritis   . Cataract   . DDD (degenerative disc disease)   . GERD (gastroesophageal reflux disease)   . Hyperlipidemia   . Hypertension   . OSA on CPAP    MODERATE OSA PER STUDY 2010  . Perianal fistula   . PONV (postoperative nausea and vomiting)    and hear to wake    Patient Active Problem List   Diagnosis Date Noted  . Allergic rhinitis 11/24/2015  . Severe obesity (BMI >= 40) (HCC) 11/24/2015  . Hereditary and idiopathic peripheral neuropathy 03/24/2015  . Metabolic syndrome 03/09/2015  . Hypertension 02/13/2012  . Hyperlipidemia 02/13/2012  . Sleep apnea 02/13/2012    Past Surgical History:  Procedure Laterality Date  . CERVICAL FUSION  1990's  . EVALUATION UNDER ANESTHESIA WITH ANAL FISTULECTOMY N/A 08/03/2012   Procedure: EXAM UNDER ANESTHESIA WITH  ANAL FISTULECTOMY ;  Surgeon: Romie Levee, MD;  Location: [facility];  Service: General;  Laterality: N/A;  . INCISION AND DRAINAGE PERIRECTAL ABSCESS  02/13/2012   Procedure: IRRIGATION AND DEBRIDEMENT PERIRECTAL ABSCESS;  Surgeon: Velora Heckler, MD;  Location: [facility] OR;  Service: General;  Laterality: N/A;  . LUMBAR FUSION  2010  . RIGHT HYDROCELECTOMY  07-28-2001  . SHOULDER ARTHROSCOPY WITH ROTATOR CUFF REPAIR Left 1990's       Home Medications    Prior to Admission medications   Medication Sig Start Date End Date Taking? Authorizing Provider  amLODipine (NORVASC) 5 MG tablet Take 1 tablet (5 mg total) by mouth daily. 08/22/16   Daphine Deutscher Mary-Margaret, FNP  aspirin 81 MG tablet Take 81 mg by mouth daily.    [provider]  cetirizine (ZYRTEC) 10 MG tablet Take 1 tablet (10 mg total) by mouth daily. 08/22/16   Daphine Deutscher Mary-Margaret, FNP  Cholecalciferol (VITAMIN D3) 1000 UNITS CAPS Take 1 capsule by mouth daily.     [provider]  cyclobenzaprine (FLEXERIL) 10 MG tablet Take 1 tablet (10 mg total) by mouth at bedtime as needed for muscle spasms. 02/01/16   Johna Sheriff, MD  furosemide (LASIX) 20 MG tablet Take 1 tablet (20 mg total) by mouth daily. 11/24/15   Johna Sheriff, MD  gabapentin (NEURONTIN) 300  MG capsule TAKE ONE CAPSULE BY MOUTH ONCE DAILY AT BEDTIME 08/22/16   Daphine Deutscher, Mary-Margaret, FNP  ibuprofen (ADVIL,MOTRIN) 200 MG tablet Take 200 mg by mouth 2 (two) times daily as needed.    [provider]  losartan-hydrochlorothiazide (HYZAAR) 100-25 MG tablet TAKE ONE TABLET BY MOUTH ONCE DAILY 08/22/16   Bennie Pierini, FNP  metFORMIN (GLUCOPHAGE-XR) 500 MG 24 hr tablet Take 1 tablet (500 mg total) by mouth daily with breakfast. 08/22/16   Daphine Deutscher, Mary-Margaret, FNP  naproxen (NAPROSYN) 500 MG tablet TAKE 1 TABLET BY MOUTH TWICE DAILY WITH A MEAL 08/26/16   Daphine Deutscher, Mary-Margaret, FNP  omeprazole (PRILOSEC) 40 MG capsule TAKE ONE  CAPSULE BY MOUTH ONCE DAILY IN THE MORNING 08/22/16   Daphine Deutscher, Mary-Margaret, FNP  ondansetron (ZOFRAN) 4 MG tablet Take 1 tablet (4 mg total) by mouth every 8 (eight) hours as needed. 10/14/16   Devoria Albe, MD  Potassium Gluconate 80 MG TABS Take 99 mg by mouth.    [provider]  simvastatin (ZOCOR) 40 MG tablet Take 1 tablet (40 mg total) by mouth at bedtime. 08/22/16   Bennie Pierini, FNP    Family History Family History  Problem Relation Age of Onset  . Brain cancer Mother   . Diabetes Mother   . Kidney Stones Father   . Diabetes Sister   . Heart disease Sister   . Lymphoma Brother   . Heart disease Brother     Social History Social History  Substance Use Topics  . Smoking status: Former Smoker    Packs/day: 0.50    Years: 25.00    Types: Cigarettes    Quit date: 02/12/2004  . Smokeless tobacco: Never Used  . Alcohol use Yes     Comment: occassional  retired Lives with spouse Lives at home Drinks 1 pint in 2-3 days per patient, per wife drinks 1/2 gal of vodka every couple of days   Allergies   Hydrocodeine [dihydrocodeine]   Review of Systems Review of Systems  All other systems reviewed and are negative.    Physical Exam Updated Vital Signs BP 119/71 (BP Location: Left Arm)   Pulse 80   Temp 98 F (36.7 C) (Oral)   Resp 18   Ht 5\' 8"  (1.727 m)   Wt 136.1 kg (300 lb)   SpO2 94%   BMI 45.61 kg/m   Vital signs normal    Physical Exam  Constitutional: He is oriented to person, place, and time. He appears well-developed and well-nourished.  HENT:  Head: Normocephalic and atraumatic.  Right Ear: External ear normal.  Left Ear: External ear normal.  Nose: Nose normal.  Mouth/Throat: Oropharynx is clear and moist.  Eyes: Pupils are equal, round, and reactive to light. Conjunctivae and EOM are normal.  Pupils small   Neck: Normal range of motion. Neck supple.  Cardiovascular: Normal rate, regular rhythm and normal heart sounds.     Pulmonary/Chest: Effort normal. No respiratory distress. He has no wheezes. He has no rales. He exhibits no tenderness.  Musculoskeletal: Normal range of motion. He exhibits no edema, tenderness or deformity.  Neurological: He is alert and oriented to person, place, and time. No cranial nerve deficit.  No pronator drift, no motor weakness, grips equal  Skin: Skin is warm and dry. Capillary refill takes less than 2 seconds. No rash noted. No erythema. No pallor.  Psychiatric: He has a normal mood and affect. His behavior is normal. Thought content normal.  Nursing note and vitals reviewed.  ED Treatments / Results  Labs (all labs ordered are listed, but only abnormal results are displayed) Labs Reviewed - No data to display  EKG  EKG Interpretation None       Radiology Ct Head Wo Contrast  Result Date: 10/14/2016 CLINICAL DATA:  61 y/o  M; status post fall. EXAM: CT HEAD WITHOUT CONTRAST TECHNIQUE: Contiguous axial images were obtained from the base of the skull through the vertex without intravenous contrast. COMPARISON:  None. FINDINGS: Brain: No evidence of acute infarction, hemorrhage, hydrocephalus, extra-axial collection or mass lesion/mass effect. Vascular: No hyperdense vessel or unexpected calcification. Skull: Normal. Negative for fracture or focal lesion. Sinuses/Orbits: No acute finding. Other: None. IMPRESSION: No acute intracranial abnormality identified. Unremarkable CT of the head for age. Electronically Signed   By: Mitzi HansenLance  Furusawa-Stratton M.D.   On: 10/14/2016 05:38    Procedures Procedures (including critical care time)  Medications Ordered in ED Medications  ondansetron (ZOFRAN-ODT) disintegrating tablet 4 mg (4 mg Oral Given 10/14/16 0505)  ondansetron (ZOFRAN-ODT) disintegrating tablet 4 mg (4 mg Oral Given 10/14/16 16100614)     Initial Impression / Assessment and Plan / ED Course  I have reviewed the triage vital signs and the nursing notes.  Pertinent labs  & imaging results that were available during my care of the patient were reviewed by me and considered in my medical decision making (see chart for details).     Patient was given Zofran. CT of the head was done due to him falling and having some altered mental status afterwards.  Recheck at 6 AM patient states feeling better. He still has some nausea though. He was given more Zofran. He was wanting to drink however I asked him to wait until his nausea is better controlled.wife wants me to tell him all the things that can happen when you abuse alcohol. He and the wife disagree on how much he is drinking and when he drinks.  Recheck at 6:55 AM nausea gone, was able to drink fluids. Sat up unassisted and states he feels fine.   Final Clinical Impressions(s) / ED Diagnoses   Final diagnoses:  Fall in home, initial encounter  Nausea  Minor head injury, initial encounter  Alcoholic intoxication without complication (HCC)    New Prescriptions New Prescriptions   ONDANSETRON (ZOFRAN) 4 MG TABLET    Take 1 tablet (4 mg total) by mouth every 8 (eight) hours as needed.    Plan discharge  Devoria AlbeIva Damarion Mendizabal, MD, [place] PyoFACEP    Ricketta Colantonio, MD 10/14/16 [phone]

## 2016-10-14 NOTE — ED Triage Notes (Signed)
Pt brought in by [facility] for c/o fall; pt admits to drinking alcohol earlier this evening and pt got up to go to bathroom and fell while in bathroom; pt denies any pain

## 2016-10-14 NOTE — Discharge Instructions (Signed)
Use the zofran if you continue to have nausea. Return to the ED for any problems listed on the head injury sheet. Your wife is very concerned about how much alcohol you drink. You should try to cut back or stop. You can get outpatient treatment if you need help stopping.

## 2016-10-22 ENCOUNTER — Other Ambulatory Visit: Payer: Self-pay | Admitting: Nurse Practitioner

## 2016-10-22 DIAGNOSIS — M25512 Pain in left shoulder: Secondary | ICD-10-CM

## 2016-11-26 ENCOUNTER — Ambulatory Visit (INDEPENDENT_AMBULATORY_CARE_PROVIDER_SITE_OTHER): Payer: Medicare HMO | Admitting: Nurse Practitioner

## 2016-11-26 ENCOUNTER — Encounter: Payer: Self-pay | Admitting: Nurse Practitioner

## 2016-11-26 VITALS — BP 125/79 | HR 81 | Temp 97.3°F | Ht 68.0 in | Wt 294.2 lb

## 2016-11-26 DIAGNOSIS — R7303 Prediabetes: Secondary | ICD-10-CM | POA: Diagnosis not present

## 2016-11-26 DIAGNOSIS — E782 Mixed hyperlipidemia: Secondary | ICD-10-CM | POA: Diagnosis not present

## 2016-11-26 DIAGNOSIS — I10 Essential (primary) hypertension: Secondary | ICD-10-CM

## 2016-11-26 DIAGNOSIS — Z23 Encounter for immunization: Secondary | ICD-10-CM | POA: Diagnosis not present

## 2016-11-26 DIAGNOSIS — E8881 Metabolic syndrome: Secondary | ICD-10-CM | POA: Diagnosis not present

## 2016-11-26 DIAGNOSIS — G4733 Obstructive sleep apnea (adult) (pediatric): Secondary | ICD-10-CM | POA: Diagnosis not present

## 2016-11-26 DIAGNOSIS — G609 Hereditary and idiopathic neuropathy, unspecified: Secondary | ICD-10-CM

## 2016-11-26 LAB — BAYER DCA HB A1C WAIVED: HB A1C (BAYER DCA - WAIVED): 6 % (ref <7.0)

## 2016-11-26 NOTE — Patient Instructions (Signed)
Diet for Metabolic Syndrome Metabolic syndrome is a disorder that includes at least three of these conditions:  Abdominal obesity.  Too much sugar in your blood.  High blood pressure.  Higher than normal amount of fat (lipids) in your blood.  Lower than normal level of "good" cholesterol (HDL).  Following a healthy diet can help to keep metabolic syndrome under control. It can also help to prevent the development of conditions that are associated with metabolic syndrome, such as diabetes, heart disease, and stroke. Along with exercise, a healthy diet:  Helps to improve the way that the body uses insulin.  Promotes weight loss. A common goal for people with this condition is to lose at least 7 to 10 percent of their starting weight.  What do I need to know about this diet?  Use the glycemic index (GI) to plan your meals. The index tells you how quickly a food will raise your blood sugar. Choose foods that have low GI values. These foods take a longer time to raise blood sugar.  Keep track of how many calories you take in. Eating the right amount of calories will help your achieve a healthy weight.  You may want to follow a Mediterranean diet. This diet includes lots of vegetables, lean meats or fish, whole grains, fruits, and healthy oils and fats. What foods can I eat? Grains Stone-ground whole wheat. Pumpernickel bread. Whole-grain bread, crackers, tortillas, cereal, and pasta. Unsweetened oatmeal.Bulgur.Barley.Quinoa.Brown rice or wild rice. Vegetables Lettuce. Spinach. Peas. Beets. Cauliflower. Cabbage. Broccoli. Carrots. Tomatoes. Squash. Eggplant. Herbs. Peppers. Onions. Cucumbers. Brussels sprouts. Sweet potatoes. Yams. Beans. Lentils. Fruits Berries. Apples. Oranges. Grapes. Mango. Pomegranate. Kiwi. Cherries. Meats and Other Protein Sources Seafood and shellfish. Lean meats.Poultry. Tofu. Dairy Low-fat or fat-free dairy products, such as milk, yogurt, and  cheese. Beverages Water. Low-fat milk. Milk alternatives, like soy milk or almond milk. Real fruit juice. Condiments Low-sugar or sugar-free ketchup, barbecue sauce, and mayonnaise. Mustard. Relish. Fats and Oils Avocado. Canola or olive oil. Nuts and nut butters.Seeds. The items listed above may not be a complete list of recommended foods or beverages. Contact your dietitian for more options. What foods are not recommended? Red meat. Palm oil and coconut oil. Processed foods. Fried foods. Alcohol. Sweetened drinks, such as iced tea and soda. Sweets. Salty foods. The items listed above may not be a complete list of foods and beverages to avoid. Contact your dietitian for more information. This information is not intended to replace advice given to you by your health care provider. Make sure you discuss any questions you have with your health care provider. Document Released: 06/14/2014 Document Revised: 06/09/2015 Document Reviewed: 02/09/2014 Elsevier Interactive Patient Education  2018 Elsevier Inc.     

## 2016-11-26 NOTE — Progress Notes (Signed)
Subjective:    Patient ID: Evan Smith, male    DOB: January 08, 1956, 61 y.o.   MRN: [id number]  HPI  Evan Smith is here today for follow up of chronic medical problem.  Outpatient Encounter Prescriptions as of 11/26/2016  Medication Sig  . amLODipine (NORVASC) 5 MG tablet Take 1 tablet (5 mg total) by mouth daily.  Marland Kitchen aspirin 81 MG tablet Take 81 mg by mouth daily.  . cetirizine (ZYRTEC) 10 MG tablet Take 1 tablet (10 mg total) by mouth daily.  . Cholecalciferol (VITAMIN D3) 1000 UNITS CAPS Take 1 capsule by mouth daily.   . cyclobenzaprine (FLEXERIL) 10 MG tablet Take 1 tablet (10 mg total) by mouth at bedtime as needed for muscle spasms.  . furosemide (LASIX) 20 MG tablet Take 1 tablet (20 mg total) by mouth daily.  Marland Kitchen gabapentin (NEURONTIN) 300 MG capsule TAKE ONE CAPSULE BY MOUTH ONCE DAILY AT BEDTIME  . ibuprofen (ADVIL,MOTRIN) 200 MG tablet Take 200 mg by mouth 2 (two) times daily as needed.  Marland Kitchen losartan-hydrochlorothiazide (HYZAAR) 100-25 MG tablet TAKE ONE TABLET BY MOUTH ONCE DAILY  . metFORMIN (GLUCOPHAGE-XR) 500 MG 24 hr tablet Take 1 tablet (500 mg total) by mouth daily with breakfast.  . naproxen (NAPROSYN) 500 MG tablet TAKE 1 TABLET BY MOUTH TWICE DAILY WITH A MEAL  . omeprazole (PRILOSEC) 40 MG capsule TAKE ONE CAPSULE BY MOUTH ONCE DAILY IN THE MORNING  . ondansetron (ZOFRAN) 4 MG tablet Take 1 tablet (4 mg total) by mouth every 8 (eight) hours as needed.  . Potassium Gluconate 80 MG TABS Take 99 mg by mouth.  . simvastatin (ZOCOR) 40 MG tablet Take 1 tablet (40 mg total) by mouth at bedtime.   No facility-administered encounter medications on file as of 11/26/2016.     1. Essential hypertension  No c/o chest pain, sob or headache. Does not check blood pressure at home BP Readings from Last 3 Encounters:  10/14/16 126/66  08/22/16 124/70  04/04/16 121/78     2. Obstructive sleep apnea syndrome  Uses CPAP nightly- feels rested in the mornings  3. Hereditary  and idiopathic peripheral neuropathy  Feet burn all the time. neurotin helps with burning sensation to make it tolerable.  4. Severe obesity (BMI >= 40) (HCC)  No recent weight changes  5. Metabolic syndrome  Patient does not check blood sugars at home. Last HGBA1c was 5.9%  6. Mixed hyperlipidemia  Does not watch diet at all- no exercise    New complaints: None today  Social history: Still working full time    Review of Systems  Constitutional: Negative for activity change and appetite change.  HENT: Negative.   Eyes: Negative for pain.  Respiratory: Negative for shortness of breath.   Cardiovascular: Negative for chest pain, palpitations and leg swelling.  Gastrointestinal: Negative for abdominal pain.  Endocrine: Negative for polydipsia.  Genitourinary: Negative.   Skin: Negative for rash.  Neurological: Negative for dizziness, weakness and headaches.  Hematological: Does not bruise/bleed easily.  Psychiatric/Behavioral: Negative.   All other systems reviewed and are negative.      Objective:   Physical Exam  Constitutional: He is oriented to person, place, and time. He appears well-developed and well-nourished.  HENT:  Head: Normocephalic.  Right Ear: External ear normal.  Left Ear: External ear normal.  Nose: Nose normal.  Mouth/Throat: Oropharynx is clear and moist.  Eyes: Pupils are equal, round, and reactive to light. EOM are normal.  Neck: Normal  range of motion. Neck supple. No JVD present. No thyromegaly present.  Cardiovascular: Normal rate, regular rhythm, normal heart sounds and intact distal pulses.  Exam reveals no gallop and no friction rub.   No murmur heard. Pulmonary/Chest: Effort normal and breath sounds normal. No respiratory distress. He has no wheezes. He has no rales. He exhibits no tenderness.  Abdominal: Soft. Bowel sounds are normal. He exhibits no mass. There is no tenderness.  Musculoskeletal: Normal range of motion. He exhibits no  edema.  Lymphadenopathy:    He has no cervical adenopathy.  Neurological: He is alert and oriented to person, place, and time. No cranial nerve deficit.  Skin: Skin is warm and dry.  Psychiatric: He has a normal mood and affect. His behavior is normal. Judgment and thought content normal.   BP 125/79   Pulse 81   Temp (!) 97.3 F (36.3 C) (Oral)   Ht '5\' 8"'  (1.727 m)   Wt 294 lb 3.2 oz (133.4 kg)   BMI 44.73 kg/m   hgba1c 6.0% today    Assessment & Plan:  1. Essential hypertension Low sodium diet - CMP14+EGFR  2. Obstructive sleep apnea syndrome Continue with cpap nightly  3. Hereditary and idiopathic peripheral neuropathy Wear shoes- d0 not go barefooted, especially outside  4. Severe obesity (BMI >= 40) (HCC) Discussed diet and exercise for person with BMI >25 Will recheck weight in 3-6 months  5. Metabolic syndrome Watch carbs in diet - Bayer DCA Hb A1c Waived 6. Mixed hyperlipidemia Low fat diet - Lipid panel      Labs pending Health maintenance reviewed Diet and exercise encouraged Continue all meds Follow up  In 3 months   [place], FNP

## 2016-11-27 ENCOUNTER — Other Ambulatory Visit: Payer: Self-pay | Admitting: Pediatrics

## 2016-11-27 DIAGNOSIS — M25512 Pain in left shoulder: Secondary | ICD-10-CM

## 2016-11-27 LAB — CMP14+EGFR
A/G RATIO: 2 (ref 1.2–2.2)
ALBUMIN: 4.5 g/dL (ref 3.6–4.8)
ALT: 22 IU/L (ref 0–44)
AST: 29 IU/L (ref 0–40)
Alkaline Phosphatase: 60 IU/L (ref 39–117)
BILIRUBIN TOTAL: 0.2 mg/dL (ref 0.0–1.2)
BUN / CREAT RATIO: 13 (ref 10–24)
BUN: 15 mg/dL (ref 8–27)
CHLORIDE: 101 mmol/L (ref 96–106)
CO2: 24 mmol/L (ref 20–29)
Calcium: 9.5 mg/dL (ref 8.6–10.2)
Creatinine, Ser: 1.19 mg/dL (ref 0.76–1.27)
GFR calc non Af Amer: 65 mL/min/{1.73_m2} (ref >59)
GFR, EST AFRICAN AMERICAN: 75 mL/min/{1.73_m2} (ref >59)
GLUCOSE: 86 mg/dL (ref 65–99)
Globulin, Total: 2.3 g/dL (ref 1.5–4.5)
Potassium: 4.6 mmol/L (ref 3.5–5.2)
Sodium: 143 mmol/L (ref 134–144)
TOTAL PROTEIN: 6.8 g/dL (ref 6.0–8.5)

## 2016-11-27 LAB — LIPID PANEL
Chol/HDL Ratio: 3.2 ratio (ref 0.0–5.0)
Cholesterol, Total: 163 mg/dL (ref 100–199)
HDL: 51 mg/dL (ref >39)
LDL Calculated: 82 mg/dL (ref 0–99)
Triglycerides: 149 mg/dL (ref 0–149)
VLDL CHOLESTEROL CAL: 30 mg/dL (ref 5–40)

## 2016-11-28 ENCOUNTER — Telehealth: Payer: Self-pay | Admitting: Nurse Practitioner

## 2016-11-28 NOTE — Telephone Encounter (Signed)
Just viral uri Force fluids  Rest Tylenol cold and sinus OTC

## 2016-11-28 NOTE — Telephone Encounter (Signed)
What symptoms do you have? Head congestion, ringing in ears, a lot of drainage, sneezing, watery eyes  How long have you been sick? Since Tuesday   Have you been seen for this problem? Yes saw MMM on Monday  If your provider decides to give you a prescription, which pharmacy would you like for it to be sent to? [facility]   Patient informed that this information will be sent to the clinical staff for review and that they should receive a follow up call.

## 2016-11-29 NOTE — Telephone Encounter (Signed)
Patient aware.

## 2016-11-29 NOTE — Telephone Encounter (Signed)
lmtcb

## 2016-12-25 DIAGNOSIS — M1711 Unilateral primary osteoarthritis, right knee: Secondary | ICD-10-CM | POA: Diagnosis not present

## 2016-12-25 DIAGNOSIS — M5136 Other intervertebral disc degeneration, lumbar region: Secondary | ICD-10-CM | POA: Diagnosis not present

## 2016-12-25 DIAGNOSIS — M19012 Primary osteoarthritis, left shoulder: Secondary | ICD-10-CM | POA: Diagnosis not present

## 2016-12-25 DIAGNOSIS — M1712 Unilateral primary osteoarthritis, left knee: Secondary | ICD-10-CM | POA: Diagnosis not present

## 2016-12-25 DIAGNOSIS — M961 Postlaminectomy syndrome, not elsewhere classified: Secondary | ICD-10-CM | POA: Diagnosis not present

## 2016-12-25 DIAGNOSIS — R262 Difficulty in walking, not elsewhere classified: Secondary | ICD-10-CM | POA: Diagnosis not present

## 2017-02-18 ENCOUNTER — Telehealth: Payer: Self-pay | Admitting: Nurse Practitioner

## 2017-02-20 MED ORDER — LISINOPRIL-HYDROCHLOROTHIAZIDE 20-25 MG PO TABS: 1.0000 | ORAL_TABLET | Freq: Every day | ORAL | 1 refills | Status: DC

## 2017-02-20 NOTE — Telephone Encounter (Signed)
Patient notified that rx sent to pharmacy 

## 2017-02-20 NOTE — Telephone Encounter (Signed)
Pt states his pharmacy told him they didn't have any in stock due to the recall.

## 2017-02-20 NOTE — Telephone Encounter (Signed)
Pt wants to talk to nurse about recall on bp med. Please call back

## 2017-02-20 NOTE — Telephone Encounter (Signed)
losartan changed to lisinopril 20/25 1 daily

## 2017-02-20 NOTE — Telephone Encounter (Signed)
Pt aware we are waiting on provider to advise on different medication.

## 2017-02-20 NOTE — Telephone Encounter (Signed)
Have patient contact their pharmacy and see if theres is included in recall- it was only certain brnads and lot Numbrs- not all losartan

## 2017-03-19 ENCOUNTER — Other Ambulatory Visit: Payer: Self-pay | Admitting: Nurse Practitioner

## 2017-03-19 DIAGNOSIS — M25512 Pain in left shoulder: Secondary | ICD-10-CM

## 2017-03-25 ENCOUNTER — Ambulatory Visit: Payer: Self-pay | Admitting: *Deleted

## 2017-03-27 ENCOUNTER — Ambulatory Visit (INDEPENDENT_AMBULATORY_CARE_PROVIDER_SITE_OTHER): Payer: Medicare HMO

## 2017-03-27 ENCOUNTER — Encounter: Payer: Self-pay | Admitting: Nurse Practitioner

## 2017-03-27 ENCOUNTER — Ambulatory Visit (INDEPENDENT_AMBULATORY_CARE_PROVIDER_SITE_OTHER): Payer: Medicare HMO | Admitting: *Deleted

## 2017-03-27 ENCOUNTER — Ambulatory Visit (INDEPENDENT_AMBULATORY_CARE_PROVIDER_SITE_OTHER): Payer: Medicare HMO | Admitting: Nurse Practitioner

## 2017-03-27 VITALS — BP 122/77 | HR 65 | Temp 97.1°F | Ht 68.0 in | Wt 296.0 lb

## 2017-03-27 DIAGNOSIS — Z0000 Encounter for general adult medical examination without abnormal findings: Secondary | ICD-10-CM

## 2017-03-27 DIAGNOSIS — M25511 Pain in right shoulder: Secondary | ICD-10-CM

## 2017-03-27 DIAGNOSIS — G8929 Other chronic pain: Secondary | ICD-10-CM

## 2017-03-27 MED ORDER — DICLOFENAC SODIUM 1 % TD GEL: 4.0000 g | Freq: Four times a day (QID) | TRANSDERMAL | 2 refills | Status: DC

## 2017-03-27 NOTE — Patient Instructions (Signed)
  [name] , Thank you for taking time to come for your Medicare Wellness Visit. I appreciate your ongoing commitment to your health goals. Please review the following plan we discussed and let me know if I can assist you in the future.   These are the goals we discussed: Goals    . Increase physical activity     Start walking  Get active     . Prevent falls       This is a list of the screening recommended for you and due dates:  Health Maintenance  Topic Date Due  . Colon Cancer Screening  08/22/2017*  . Tetanus Vaccine  03/27/2018*  . Flu Shot  Completed  .  Hepatitis C: One time screening is recommended by Center for Disease Control  (CDC) for  adults born from [id number] through 1966.   Completed  . HIV Screening  Completed  *Topic was postponed. The date shown is not the original due date.    Keep follow up with Paulene FloorMary Martin and other specialist Make an EYE exam appt. Remember that you are due for a Tetanus!! Review the advanced directives and bring in a copy if you fill it out

## 2017-03-27 NOTE — Progress Notes (Signed)
Subjective:   Evan Smith is a 62 y.o. male who presents for Medicare Annual/Subsequent preventive examination. He is disabled. He formerly worked several jobs, including [employer], [employer], [employer] and some security work. He enjoys working on old cars in his spare time. He states that he walks for exercise at times and needs to get more active. He eats a semi-healthy diet and usually has 2 home cooked meals a day. He is active in church. He is currently going through a separation and is living with a nephew. He has 2 sons, one lives in [place] and the other here in [place]. He doesn't currently have any pets and we discussed fall risk and hazards today. He states that his health is about the same as it was a year ago.   Cardiac Risk Factors include: advanced age (>[id number], 75>65 women);hypertension;male gender;diabetes mellitus;dyslipidemia     Objective:    Vitals: BP 122/77 (BP Location: Left Arm)   Pulse 65   Temp (!) 97.1 F (36.2 C) (Oral)   Ht 5\' 8"  (1.727 m)   Wt 296 lb (134.3 kg)   BMI 45.01 kg/m   Body mass index is 45.01 kg/m.  Advanced Directives 03/27/2017 10/14/2016 03/21/2016 03/09/2015 08/03/2012 02/14/2012  Does Patient Have a Medical Advance Directive? No No No No Patient does not have advance directive;Patient would like information Patient does not have advance directive;Patient would not like information  Would patient like information on creating a medical advance directive? Yes (MAU/Ambulatory/Procedural Areas - Information given) - Yes (MAU/Ambulatory/Procedural Areas - Information given) Yes - Educational materials given Advance directive packet given -  Pre-existing out of facility DNR order (yellow form or pink MOST form) - - - - No No    Tobacco Social History   Tobacco Use  Smoking Status Former Smoker  . Packs/day: 0.50  . Years: 25.00  . Pack years: 12.50  . Types: Cigarettes  . Last attempt to quit: 02/12/2004  . Years since quitting: 13.1  Smokeless Tobacco Never  Used     Counseling given: Not Answered   Clinical Intake:                       Past Medical History:  Diagnosis Date  . Arthritis   . Cataract   . DDD (degenerative disc disease)   . Diabetes mellitus without complication (HCC)    borderline  . GERD (gastroesophageal reflux disease)   . Hyperlipidemia   . Hypertension   . OSA on CPAP    MODERATE OSA PER STUDY 2010  . Perianal fistula   . PONV (postoperative nausea and vomiting)    and hear to wake   Past Surgical History:  Procedure Laterality Date  . CERVICAL FUSION  1990's  . EVALUATION UNDER ANESTHESIA WITH ANAL FISTULECTOMY N/A 08/03/2012   Procedure: EXAM UNDER ANESTHESIA WITH ANAL FISTULECTOMY ;  Surgeon: Romie LeveeAlicia Thomas, MD;  [facility];  Service: General;  Laterality: N/A;  . INCISION AND DRAINAGE PERIRECTAL ABSCESS  02/13/2012   Procedure: IRRIGATION AND DEBRIDEMENT PERIRECTAL ABSCESS;  Surgeon: Velora Hecklerodd M Gerkin, MD;  [facility];  Service: General;  Laterality: N/A;  . LUMBAR FUSION  2010   has had 2 low back surgeries   . RIGHT HYDROCELECTOMY  07-28-2001  . SHOULDER ARTHROSCOPY WITH ROTATOR CUFF REPAIR Left 1990's   Family History  Problem Relation Age of Onset  . Brain cancer Mother        tumor  .  Diabetes Mother   . Kidney Stones Father   . Diabetes Sister   . Heart disease Sister   . GI problems Sister   . Cancer Brother        prostate  . Cancer Brother        lymphoma   . Heart disease Brother   . Blindness Son   . Arthritis Son        trouble with legs  . Heart disease Paternal Uncle   . Arthritis Son        shoulder and back    Social History   Socioeconomic History  . Marital status: Legally Separated    Spouse name: None  . Number of children: 2  . Years of education: None  . Highest education level: None  Social Needs  . Financial resource strain: None  . Food insecurity - worry: None  . Food insecurity - inability: None  . Transportation  needs - medical: None  . Transportation needs - non-medical: None  Occupational History    Comment: [employer] - [employer] - disability   Tobacco Use  . Smoking status: Former Smoker    Packs/day: 0.50    Years: 25.00    Pack years: 12.50    Types: Cigarettes    Last attempt to quit: 02/12/2004    Years since quitting: 13.1  . Smokeless tobacco: Never Used  Substance and Sexual Activity  . Alcohol use: Yes    Comment: occassional  . Drug use: No  . Sexual activity: Yes  Other Topics Concern  . None  Social History Narrative  . None    Outpatient Encounter Medications as of 03/27/2017  Medication Sig  . amLODipine (NORVASC) 5 MG tablet Take 1 tablet (5 mg total) by mouth daily.  Marland Kitchen aspirin 81 MG tablet Take 81 mg by mouth daily.  . cetirizine (ZYRTEC) 10 MG tablet Take 1 tablet (10 mg total) by mouth daily.  . Cholecalciferol (VITAMIN D3) 1000 UNITS CAPS Take 1 capsule by mouth daily.   Marland Kitchen gabapentin (NEURONTIN) 300 MG capsule TAKE ONE CAPSULE BY MOUTH ONCE DAILY AT BEDTIME  . ibuprofen (ADVIL,MOTRIN) 200 MG tablet Take 200 mg by mouth 2 (two) times daily as needed.  Marland Kitchen lisinopril-hydrochlorothiazide (PRINZIDE,ZESTORETIC) 20-25 MG tablet Take 1 tablet by mouth daily.  . metFORMIN (GLUCOPHAGE-XR) 500 MG 24 hr tablet Take 1 tablet (500 mg total) by mouth daily with breakfast.  . naproxen (NAPROSYN) 500 MG tablet TAKE ONE TABLET BY MOUTH TWICE DAILY WITH A MEAL  . omeprazole (PRILOSEC) 40 MG capsule TAKE ONE CAPSULE BY MOUTH ONCE DAILY IN THE MORNING  . Potassium Gluconate 80 MG TABS Take 99 mg by mouth.  . simvastatin (ZOCOR) 40 MG tablet Take 1 tablet (40 mg total) by mouth at bedtime.  . [DISCONTINUED] cyclobenzaprine (FLEXERIL) 10 MG tablet Take 1 tablet (10 mg total) by mouth at bedtime as needed for muscle spasms.  . [DISCONTINUED] furosemide (LASIX) 20 MG tablet Take 1 tablet (20 mg total) by mouth daily.  . [DISCONTINUED] naproxen (NAPROSYN) 500 MG tablet TAKE 1 TABLET BY MOUTH TWICE  DAILY WITH MEALS  . [DISCONTINUED] ondansetron (ZOFRAN) 4 MG tablet Take 1 tablet (4 mg total) by mouth every 8 (eight) hours as needed.   No facility-administered encounter medications on file as of 03/27/2017.     Activities of Daily Living In your present state of health, do you have any difficulty performing the following activities: 03/27/2017  Hearing? N  Vision? Jeannie Fend  Comment wears RX glasses  Difficulty concentrating or making decisions? N  Walking or climbing stairs? Y  Comment arthritis and back pain   Dressing or bathing? N  Doing errands, shopping? N  Preparing Food and eating ? N  Using the Toilet? N  In the past six months, have you accidently leaked urine? N  Do you have problems with loss of bowel control? N  Managing your Medications? N  Managing your Finances? N  Housekeeping or managing your Housekeeping? N  Some recent data might be hidden    Patient Care Team: Bennie Pierini, FNP as PCP - General (Family Medicine) Darnell Level, MD as Consulting Physician (General Surgery) West Bali, MD as Consulting Physician (Gastroenterology) Dalbert Mayotte, MD (Orthopedic Surgery) Shirlean Kelly, MD as Consulting Physician (Neurosurgery) Hilda Lias, MD as Attending Physician (Neurosurgery)   Assessment:   This is a routine wellness examination for Jennings.  Exercise Activities and Dietary recommendations Current Exercise Habits: Home exercise routine(needs to re-start ), Type of exercise: walking  Goals    . Increase physical activity     Start walking  Get active     . Prevent falls       Fall Risk Fall Risk  03/27/2017 11/26/2016 08/22/2016 04/04/2016 03/21/2016  Falls in the past year? Yes No No No No  Number falls in past yr: 1 - - - -  Injury with Fall? No - - - -   Is the patient's home free of loose throw rugs in walkways, pet beds, electrical cords, etc?  Fall hazards and risks were discussed today.  Depression Screen PHQ 2/9 Scores  03/27/2017 11/26/2016 08/22/2016 04/04/2016  PHQ - 2 Score 0 0 0 0    Cognitive Function MMSE - Mini Mental State Exam 03/27/2017 03/22/2016 03/21/2016 03/09/2015  Orientation to time 5 - 5 5  Orientation to Place 5 - 5 5  Registration 3 - 3 3  Attention/ Calculation 5 - 3 4  Recall 3 - 3 3  Language- name 2 objects 2 - 2 2  Language- repeat 1 - 1 1  Language- follow 3 step command 3 - 3 3  Language- read & follow direction 1 - 1 1  Write a sentence 1 - 1 1  Copy design 1 1 1 1   Total score 30 - 28 29        Immunization History  Administered Date(s) Administered  . Influenza,inj,Quad PF,6+ Mos 11/24/2013, 01/13/2015, 11/24/2015, 11/26/2016    Qualifies for Shingles Vaccine? Denied today   Screening Tests Health Maintenance  Topic Date Due  . COLONOSCOPY  08/22/2017 (Originally 03/21/2016)  . TETANUS/TDAP  03/27/2018 (Originally 05/26/2016)  . INFLUENZA VACCINE  Completed  . Hepatitis C Screening  Completed  . HIV Screening  Completed   Cancer Screenings: Lung: Low Dose CT Chest recommended if Age 37-80 years, 30 pack-year currently smoking OR have quit w/in 15years. Patient does qualify. Colorectal: due - he states he misplaced the last one.  Additional Screenings: Hepatitis B/HIV/Syphillis: Hepatitis C Screening:    denied Plan:   he will keep follow up with Paulene Floor and other specialists. He will schedule a eye exam He is due for Tetanus (denied today due to cost) He will review the advanced directives and bring in a copy if he fills them out. I have personally reviewed and noted the following in the patient's chart:   . Medical and social history . Use of alcohol, tobacco or illicit drugs  . Current  medications and supplements . Functional ability and status . Nutritional status . Physical activity . Advanced directives . List of other physicians . Hospitalizations, surgeries, and ER visits in previous 12 months . Vitals . Screenings to include cognitive,  depression, and falls . Referrals and appointments  In addition, I have reviewed and discussed with patient certain preventive protocols, quality metrics, and best practice recommendations. A written personalized care plan for preventive services as well as general preventive health recommendations were provided to patient.     Helina Hullum, Almond Lint, LPN  1/61/0960   I have reviewed and agree with the above AWV documentation.   Mary-Margaret Daphine Deutscher, FNP

## 2017-03-27 NOTE — Progress Notes (Signed)
   Subjective:    Patient ID: Evan Smith, male    DOB: 07/24/1955, 62 y.o.   MRN: [id number]  HPI Patient come sin today c/o right shoulder pain. Started several months ago but is now waking him up at night. Pain seems to be worse at night. Some aon with movement. He has some cervical problems and heis not sure if it is coming from neck or not. Ibuprofen is no longer helping.    Review of Systems  Respiratory: Negative.   Cardiovascular: Negative.   Musculoskeletal: Positive for arthralgias (right shoulder). Negative for neck pain.  Neurological: Negative.   Psychiatric/Behavioral: Negative.   All other systems reviewed and are negative.      Objective:   Physical Exam  Constitutional: He is oriented to person, place, and time. He appears well-developed and well-nourished. No distress.  Cardiovascular: Normal rate and normal heart sounds.  Pulmonary/Chest: Effort normal and breath sounds normal.  Musculoskeletal:  FROM of right shoulder with pin on internal rotation No point tenderness Grips equal bil FROM of cervical spine without pain  Neurological: He is alert and oriented to person, place, and time. He has normal reflexes.  Skin: Skin is warm.  Psychiatric: He has a normal mood and affect. His behavior is normal. Judgment and thought content normal.   BP 122/77   Pulse 65   Temp (!) 97.1 F (36.2 C) (Oral)   Ht 5\' 8"  (1.727 m)   Wt 296 lb (134.3 kg)   BMI 45.01 kg/m   Right shoulder xray- normal-Preliminary reading by Paulene FloorMary Tijuana Scheidegger, FNP  [facility]       Assessment & Plan:   1. Chronic right shoulder pain    Meds ordered this encounter  Medications  . diclofenac sodium (VOLTAREN) 1 % GEL    Sig: Apply 4 g topically 4 (four) times daily.    Dispense:  5 Tube    Refill:  2    Order Specific Question:   Supervising Provider    Answer:   Johna SheriffVINCENT, CAROL L [[id number]]   Ice bid Rest If no improvement will need ortho referral  Mary-Margaret Daphine DeutscherMartin, FNP

## 2017-03-27 NOTE — Patient Instructions (Signed)
Shoulder Pain Many things can cause shoulder pain, including:  An injury.  Moving the arm in the same way again and again (overuse).  Joint pain (arthritis).  Follow these instructions at home: Take these actions to help with your pain:  Squeeze a soft ball or a foam pad as much as you can. This helps to prevent swelling. It also makes the arm stronger.  Take over-the-counter and prescription medicines only as told by your doctor.  If told, put ice on the area: ? Put ice in a plastic bag. ? Place a towel between your skin and the bag. ? Leave the ice on for 20 minutes, 2-3 times per day. Stop putting on ice if it does not help with the pain.  If you were given a shoulder sling or immobilizer: ? Wear it as told. ? Remove it to shower or bathe. ? Move your arm as little as possible. ? Keep your hand moving. This helps prevent swelling.  Contact a doctor if:  Your pain gets worse.  Medicine does not help your pain.  You have new pain in your arm, hand, or fingers. Get help right away if:  Your arm, hand, or fingers: ? Tingle. ? Are numb. ? Are swollen. ? Are painful. ? Turn white or blue. This information is not intended to replace advice given to you by your health care provider. Make sure you discuss any questions you have with your health care provider. Document Released: 07/17/2007 Document Revised: 09/24/2015 Document Reviewed: 05/23/2014 Elsevier Interactive Patient Education  2018 Elsevier Inc.  

## 2017-04-02 ENCOUNTER — Ambulatory Visit: Payer: Medicare HMO

## 2017-04-08 ENCOUNTER — Encounter: Payer: Self-pay | Admitting: Nurse Practitioner

## 2017-04-08 ENCOUNTER — Ambulatory Visit (INDEPENDENT_AMBULATORY_CARE_PROVIDER_SITE_OTHER): Payer: Medicare HMO | Admitting: Nurse Practitioner

## 2017-04-08 VITALS — BP 129/75 | HR 70 | Temp 97.2°F | Ht 68.0 in | Wt 294.0 lb

## 2017-04-08 DIAGNOSIS — K2100 Gastro-esophageal reflux disease with esophagitis, without bleeding: Secondary | ICD-10-CM

## 2017-04-08 DIAGNOSIS — E8881 Metabolic syndrome: Secondary | ICD-10-CM | POA: Diagnosis not present

## 2017-04-08 DIAGNOSIS — I10 Essential (primary) hypertension: Secondary | ICD-10-CM

## 2017-04-08 DIAGNOSIS — E782 Mixed hyperlipidemia: Secondary | ICD-10-CM

## 2017-04-08 DIAGNOSIS — G629 Polyneuropathy, unspecified: Secondary | ICD-10-CM | POA: Diagnosis not present

## 2017-04-08 DIAGNOSIS — G4733 Obstructive sleep apnea (adult) (pediatric): Secondary | ICD-10-CM

## 2017-04-08 DIAGNOSIS — N529 Male erectile dysfunction, unspecified: Secondary | ICD-10-CM

## 2017-04-08 DIAGNOSIS — G479 Sleep disorder, unspecified: Secondary | ICD-10-CM | POA: Diagnosis not present

## 2017-04-08 LAB — BAYER DCA HB A1C WAIVED: HB A1C: 5.7 % (ref <7.0)

## 2017-04-08 MED ORDER — AMLODIPINE BESYLATE 5 MG PO TABS: 5.0000 mg | ORAL_TABLET | Freq: Every day | ORAL | 1 refills | Status: DC

## 2017-04-08 MED ORDER — OMEPRAZOLE 40 MG PO CPDR: DELAYED_RELEASE_CAPSULE | ORAL | 1 refills | Status: DC

## 2017-04-08 MED ORDER — METFORMIN HCL ER 500 MG PO TB24: 500.0000 mg | ORAL_TABLET | Freq: Every day | ORAL | 1 refills | Status: DC

## 2017-04-08 MED ORDER — SIMVASTATIN 40 MG PO TABS: 40.0000 mg | ORAL_TABLET | Freq: Every day | ORAL | 1 refills | Status: DC

## 2017-04-08 MED ORDER — TADALAFIL 20 MG PO TABS: 10.0000 mg | ORAL_TABLET | ORAL | 11 refills | Status: DC | PRN

## 2017-04-08 MED ORDER — GABAPENTIN 300 MG PO CAPS: ORAL_CAPSULE | ORAL | 5 refills | Status: DC

## 2017-04-08 MED ORDER — LISINOPRIL-HYDROCHLOROTHIAZIDE 20-25 MG PO TABS: 1.0000 | ORAL_TABLET | Freq: Every day | ORAL | 1 refills | Status: DC

## 2017-04-08 NOTE — Patient Instructions (Signed)
Diabetes Mellitus and Nutrition When you have diabetes (diabetes mellitus), it is very important to have healthy eating habits because your blood sugar (glucose) levels are greatly affected by what you eat and drink. Eating healthy foods in the appropriate amounts, at about the same times every day, can help you:  Control your blood glucose.  Lower your risk of heart disease.  Improve your blood pressure.  Reach or maintain a healthy weight.  Every person with diabetes is different, and each person has different needs for a meal plan. Your health care provider may recommend that you work with a diet and nutrition specialist (dietitian) to make a meal plan that is best for you. Your meal plan may vary depending on factors such as:  The calories you need.  The medicines you take.  Your weight.  Your blood glucose, blood pressure, and cholesterol levels.  Your activity level.  Other health conditions you have, such as heart or kidney disease.  How do carbohydrates affect me? Carbohydrates affect your blood glucose level more than any other type of food. Eating carbohydrates naturally increases the amount of glucose in your blood. Carbohydrate counting is a method for keeping track of how many carbohydrates you eat. Counting carbohydrates is important to keep your blood glucose at a healthy level, especially if you use insulin or take certain oral diabetes medicines. It is important to know how many carbohydrates you can safely have in each meal. This is different for every person. Your dietitian can help you calculate how many carbohydrates you should have at each meal and for snack. Foods that contain carbohydrates include:  Bread, cereal, rice, pasta, and crackers.  Potatoes and corn.  Peas, beans, and lentils.  Milk and yogurt.  Fruit and juice.  Desserts, such as cakes, cookies, ice cream, and candy.  How does alcohol affect me? Alcohol can cause a sudden decrease in blood  glucose (hypoglycemia), especially if you use insulin or take certain oral diabetes medicines. Hypoglycemia can be a life-threatening condition. Symptoms of hypoglycemia (sleepiness, dizziness, and confusion) are similar to symptoms of having too much alcohol. If your health care provider says that alcohol is safe for you, follow these guidelines:  Limit alcohol intake to no more than 1 drink per day for nonpregnant women and 2 drinks per day for men. One drink equals 12 oz of beer, 5 oz of wine, or 1 oz of hard liquor.  Do not drink on an empty stomach.  Keep yourself hydrated with water, diet soda, or unsweetened iced tea.  Keep in mind that regular soda, juice, and other mixers may contain a lot of sugar and must be counted as carbohydrates.  What are tips for following this plan? Reading food labels  Start by checking the serving size on the label. The amount of calories, carbohydrates, fats, and other nutrients listed on the label are based on one serving of the food. Many foods contain more than one serving per package.  Check the total grams (g) of carbohydrates in one serving. You can calculate the number of servings of carbohydrates in one serving by dividing the total carbohydrates by 15. For example, if a food has 30 g of total carbohydrates, it would be equal to 2 servings of carbohydrates.  Check the number of grams (g) of saturated and trans fats in one serving. Choose foods that have low or no amount of these fats.  Check the number of milligrams (mg) of sodium in one serving. Most people   should limit total sodium intake to less than 2,300 mg per day.  Always check the nutrition information of foods labeled as "low-fat" or "nonfat". These foods may be higher in added sugar or refined carbohydrates and should be avoided.  Talk to your dietitian to identify your daily goals for nutrients listed on the label. Shopping  Avoid buying canned, premade, or processed foods. These  foods tend to be high in fat, sodium, and added sugar.  Shop around the outside edge of the grocery store. This includes fresh fruits and vegetables, bulk grains, fresh meats, and fresh dairy. Cooking  Use low-heat cooking methods, such as baking, instead of high-heat cooking methods like deep frying.  Cook using healthy oils, such as olive, canola, or sunflower oil.  Avoid cooking with butter, cream, or high-fat meats. Meal planning  Eat meals and snacks regularly, preferably at the same times every day. Avoid going long periods of time without eating.  Eat foods high in fiber, such as fresh fruits, vegetables, beans, and whole grains. Talk to your dietitian about how many servings of carbohydrates you can eat at each meal.  Eat 4-6 ounces of lean protein each day, such as lean meat, chicken, fish, eggs, or tofu. 1 ounce is equal to 1 ounce of meat, chicken, or fish, 1 egg, or 1/4 cup of tofu.  Eat some foods each day that contain healthy fats, such as avocado, nuts, seeds, and fish. Lifestyle   Check your blood glucose regularly.  Exercise at least 30 minutes 5 or more days each week, or as told by your health care provider.  Take medicines as told by your health care provider.  Do not use any products that contain nicotine or tobacco, such as cigarettes and e-cigarettes. If you need help quitting, ask your health care provider.  Work with a counselor or diabetes educator to identify strategies to manage stress and any emotional and social challenges. What are some questions to ask my health care provider?  Do I need to meet with a diabetes educator?  Do I need to meet with a dietitian?  What number can I call if I have questions?  When are the best times to check my blood glucose? Where to find more information:  American Diabetes Association: diabetes.org/food-and-fitness/food  Academy of Nutrition and Dietetics:  www.eatright.org/resources/health/diseases-and-conditions/diabetes  National Institute of Diabetes and Digestive and Kidney Diseases (NIH): www.niddk.nih.gov/health-information/diabetes/overview/diet-eating-physical-activity Summary  A healthy meal plan will help you control your blood glucose and maintain a healthy lifestyle.  Working with a diet and nutrition specialist (dietitian) can help you make a meal plan that is best for you.  Keep in mind that carbohydrates and alcohol have immediate effects on your blood glucose levels. It is important to count carbohydrates and to use alcohol carefully. This information is not intended to replace advice given to you by your health care provider. Make sure you discuss any questions you have with your health care provider. Document Released: 10/25/2004 Document Revised: 03/04/2016 Document Reviewed: 03/04/2016 Elsevier Interactive Patient Education  2018 Elsevier Inc.  

## 2017-04-08 NOTE — Progress Notes (Signed)
Subjective:    Patient ID: Evan Smith, male    DOB: August 28, 1955, 62 y.o.   MRN: [id number]  HPI  Evan Smith is here today for follow up of chronic medical problem.  Outpatient Encounter Medications as of 04/08/2017  Medication Sig  . amLODipine (NORVASC) 5 MG tablet Take 1 tablet (5 mg total) by mouth daily.  Marland Kitchen aspirin 81 MG tablet Take 81 mg by mouth daily.  . cetirizine (ZYRTEC) 10 MG tablet Take 1 tablet (10 mg total) by mouth daily.  . Cholecalciferol (VITAMIN D3) 1000 UNITS CAPS Take 1 capsule by mouth daily.   . diclofenac sodium (VOLTAREN) 1 % GEL Apply 4 g topically 4 (four) times daily.  Marland Kitchen gabapentin (NEURONTIN) 300 MG capsule TAKE ONE CAPSULE BY MOUTH ONCE DAILY AT BEDTIME  . ibuprofen (ADVIL,MOTRIN) 200 MG tablet Take 200 mg by mouth 2 (two) times daily as needed.  Marland Kitchen lisinopril-hydrochlorothiazide (PRINZIDE,ZESTORETIC) 20-25 MG tablet Take 1 tablet by mouth daily.  . metFORMIN (GLUCOPHAGE-XR) 500 MG 24 hr tablet Take 1 tablet (500 mg total) by mouth daily with breakfast.  . naproxen (NAPROSYN) 500 MG tablet TAKE ONE TABLET BY MOUTH TWICE DAILY WITH A MEAL  . omeprazole (PRILOSEC) 40 MG capsule TAKE ONE CAPSULE BY MOUTH ONCE DAILY IN THE MORNING  . Potassium Gluconate 80 MG TABS Take 99 mg by mouth.  . simvastatin (ZOCOR) 40 MG tablet Take 1 tablet (40 mg total) by mouth at bedtime.     1. Essential hypertension  No c/o chest pain, sob or headache. He does not check blood pressure at home. BP Readings from Last 3 Encounters:  03/27/17 122/77  03/27/17 122/77  11/26/16 125/79     2. Obstructive sleep apnea syndrome  He is suppose to wear CPAP nightly and says he does wear most nights.  3. Hereditary and idiopathic peripheral neuropathy  Has constant burning sensation and pain in bil feet and lower legs. He uses voltaren gel currently which he says helps  4. Mixed hyperlipidemia  Admits to not really watching diet  5. Metabolic syndrome  Last NOMV6H was  6.0%- he does take metformin daily, but does not check his blood sugars.  6. Severe obesity (BMI >= 40) (HCC)  No recent weight changes    New complaints: Having erectile dysfunction- he has used cialis and viagra in th epast- would like to try cialis again.  Social history: Still working full time    Review of Systems  Constitutional: Negative for activity change and appetite change.  HENT: Negative.   Eyes: Negative for pain.  Respiratory: Negative for shortness of breath.   Cardiovascular: Negative for chest pain, palpitations and leg swelling.  Gastrointestinal: Negative for abdominal pain.  Endocrine: Negative for polydipsia.  Genitourinary: Negative.   Skin: Negative for rash.  Neurological: Negative for dizziness, weakness and headaches.  Hematological: Does not bruise/bleed easily.  Psychiatric/Behavioral: Negative.   All other systems reviewed and are negative.      Objective:   Physical Exam  Constitutional: He is oriented to person, place, and time. He appears well-developed and well-nourished.  HENT:  Head: Normocephalic.  Right Ear: External ear normal.  Left Ear: External ear normal.  Nose: Nose normal.  Mouth/Throat: Oropharynx is clear and moist.  Eyes: EOM are normal. Pupils are equal, round, and reactive to light.  Neck: Normal range of motion. Neck supple. No JVD present. No thyromegaly present.  Cardiovascular: Normal rate, regular rhythm, normal heart sounds and intact distal  pulses. Exam reveals no gallop and no friction rub.  No murmur heard. Pulmonary/Chest: Effort normal and breath sounds normal. No respiratory distress. He has no wheezes. He has no rales. He exhibits no tenderness.  Abdominal: Soft. Bowel sounds are normal. He exhibits no mass. There is no tenderness.  Genitourinary: Prostate normal and penis normal.  Musculoskeletal: Normal range of motion. He exhibits no edema.  Lymphadenopathy:    He has no cervical adenopathy.    Neurological: He is alert and oriented to person, place, and time. No cranial nerve deficit.  Skin: Skin is warm and dry.  Psychiatric: He has a normal mood and affect. His behavior is normal. Judgment and thought content normal.   BP 129/75   Pulse 70   Temp (!) 97.2 F (36.2 C) (Oral)   Ht '5\' 8"'  (1.727 m)   Wt 294 lb (133.4 kg)   BMI 44.70 kg/m  hgba1c 5.7%     Assessment & Plan:  1. Obstructive sleep apnea syndrome Continue to wear cpap  2. Mixed hyperlipidemia Low fat diet - Lipid panel - simvastatin (ZOCOR) 40 MG tablet; Take 1 tablet (40 mg total) by mouth at bedtime.  Dispense: 90 tablet; Refill: 1  3. Metabolic syndrome Continue to watch carbsin diet - Bayer DCA Hb A1c Waived - Microalbumin / creatinine urine ratio - metFORMIN (GLUCOPHAGE-XR) 500 MG 24 hr tablet; Take 1 tablet (500 mg total) by mouth daily with breakfast.  Dispense: 90 tablet; Refill: 1  4. Severe obesity (BMI >= 40) (HCC) Discussed diet and exercise for person with BMI >25 Will recheck weight in 3-6 months  5. Essential hypertension, benign Low sodium diet - CMP14+EGFR - amLODipine (NORVASC) 5 MG tablet; Take 1 tablet (5 mg total) by mouth daily.  Dispense: 90 tablet; Refill: 1 - lisinopril-hydrochlorothiazide (PRINZIDE,ZESTORETIC) 20-25 MG tablet; Take 1 tablet by mouth daily.  Dispense: 90 tablet; Refill: 1  6. Gastroesophageal reflux disease with esophagitis Avoid spicy foods Do not eat 2 hours prior to bedtime - omeprazole (PRILOSEC) 40 MG capsule; TAKE ONE CAPSULE BY MOUTH ONCE DAILY IN THE MORNING  Dispense: 90 capsule; Refill: 1  7. Neuropathy Do not go barefoooted - gabapentin (NEURONTIN) 300 MG capsule; TAKE ONE CAPSULE BY MOUTH ONCE DAILY AT BEDTIME  Dispense: 90 capsule; Refill: 5  8. Vasculogenic erectile dysfunction, unspecified vasculogenic erectile dysfunction type - tadalafil (CIALIS) 20 MG tablet; Take 0.5-1 tablets (10-20 mg total) by mouth every other day as needed for  erectile dysfunction.  Dispense: 10 tablet; Refill: 11    Labs pending Health maintenance reviewed Diet and exercise encouraged Continue all meds Follow up  In 6 month   [place], FNP

## 2017-04-09 ENCOUNTER — Other Ambulatory Visit: Payer: Self-pay | Admitting: Nurse Practitioner

## 2017-04-09 DIAGNOSIS — M25512 Pain in left shoulder: Secondary | ICD-10-CM

## 2017-04-09 LAB — MICROALBUMIN / CREATININE URINE RATIO
Creatinine, Urine: 149.4 mg/dL
MICROALB/CREAT RATIO: 2.8 mg/g{creat} (ref 0.0–30.0)
Microalbumin, Urine: 4.2 ug/mL

## 2017-04-11 ENCOUNTER — Telehealth: Payer: Self-pay | Admitting: Nurse Practitioner

## 2017-04-11 NOTE — Telephone Encounter (Signed)
List of medications was faxed to [facility].

## 2017-04-11 NOTE — Telephone Encounter (Signed)
PT states that [facility] is needing a list of his medications. Fax ([phone]) Attn:[facility]

## 2017-04-11 NOTE — Telephone Encounter (Signed)
Patient wants all medication scripts sent [facility].

## 2017-04-16 ENCOUNTER — Telehealth: Payer: Self-pay | Admitting: Nurse Practitioner

## 2017-04-16 DIAGNOSIS — E8881 Metabolic syndrome: Secondary | ICD-10-CM

## 2017-04-16 DIAGNOSIS — N529 Male erectile dysfunction, unspecified: Secondary | ICD-10-CM

## 2017-04-16 DIAGNOSIS — K2100 Gastro-esophageal reflux disease with esophagitis, without bleeding: Secondary | ICD-10-CM

## 2017-04-16 DIAGNOSIS — G629 Polyneuropathy, unspecified: Secondary | ICD-10-CM

## 2017-04-16 DIAGNOSIS — I10 Essential (primary) hypertension: Secondary | ICD-10-CM

## 2017-04-16 DIAGNOSIS — M25512 Pain in left shoulder: Secondary | ICD-10-CM

## 2017-04-16 DIAGNOSIS — E782 Mixed hyperlipidemia: Secondary | ICD-10-CM

## 2017-04-16 MED ORDER — METFORMIN HCL ER 500 MG PO TB24: 500.0000 mg | ORAL_TABLET | Freq: Every day | ORAL | 1 refills | Status: DC

## 2017-04-16 MED ORDER — GABAPENTIN 300 MG PO CAPS: ORAL_CAPSULE | ORAL | 5 refills | Status: DC

## 2017-04-16 MED ORDER — LISINOPRIL-HYDROCHLOROTHIAZIDE 20-25 MG PO TABS: 1.0000 | ORAL_TABLET | Freq: Every day | ORAL | 1 refills | Status: DC

## 2017-04-16 MED ORDER — SIMVASTATIN 40 MG PO TABS: 40.0000 mg | ORAL_TABLET | Freq: Every day | ORAL | 1 refills | Status: DC

## 2017-04-16 MED ORDER — AMLODIPINE BESYLATE 5 MG PO TABS: 5.0000 mg | ORAL_TABLET | Freq: Every day | ORAL | 1 refills | Status: DC

## 2017-04-16 MED ORDER — OMEPRAZOLE 40 MG PO CPDR: DELAYED_RELEASE_CAPSULE | ORAL | 1 refills | Status: DC

## 2017-04-16 MED ORDER — TADALAFIL 20 MG PO TABS: 10.0000 mg | ORAL_TABLET | ORAL | 11 refills | Status: DC | PRN

## 2017-04-16 MED ORDER — CETIRIZINE HCL 10 MG PO TABS: 10.0000 mg | ORAL_TABLET | Freq: Every day | ORAL | 11 refills | Status: DC

## 2017-04-16 MED ORDER — NAPROXEN 500 MG PO TABS: 500.0000 mg | ORAL_TABLET | Freq: Two times a day (BID) | ORAL | 1 refills | Status: DC

## 2017-04-16 NOTE — Telephone Encounter (Signed)
Meds sent- patient aware.

## 2017-04-16 NOTE — Telephone Encounter (Signed)
Refer to previous phone note 

## 2017-04-17 NOTE — Telephone Encounter (Signed)
Meds sent in yesterday by pools nurse

## 2017-05-19 ENCOUNTER — Other Ambulatory Visit: Payer: Self-pay | Admitting: Nurse Practitioner

## 2017-06-12 ENCOUNTER — Other Ambulatory Visit: Payer: Self-pay | Admitting: Nurse Practitioner

## 2017-08-18 ENCOUNTER — Other Ambulatory Visit: Payer: Self-pay | Admitting: Nurse Practitioner

## 2017-08-19 NOTE — Telephone Encounter (Signed)
Needs to be see to have kidney function checked prior to getting naprosyn filled

## 2017-08-19 NOTE — Telephone Encounter (Signed)
Last seen 04/08/17

## 2017-08-19 NOTE — Telephone Encounter (Signed)
Appt made

## 2017-08-21 ENCOUNTER — Encounter: Payer: Self-pay | Admitting: Nurse Practitioner

## 2017-08-21 ENCOUNTER — Ambulatory Visit (INDEPENDENT_AMBULATORY_CARE_PROVIDER_SITE_OTHER): Payer: Medicare HMO | Admitting: Nurse Practitioner

## 2017-08-21 VITALS — BP 113/69 | HR 91 | Temp 96.8°F | Ht 68.0 in | Wt 292.0 lb

## 2017-08-21 DIAGNOSIS — E782 Mixed hyperlipidemia: Secondary | ICD-10-CM | POA: Diagnosis not present

## 2017-08-21 DIAGNOSIS — G4733 Obstructive sleep apnea (adult) (pediatric): Secondary | ICD-10-CM

## 2017-08-21 DIAGNOSIS — G629 Polyneuropathy, unspecified: Secondary | ICD-10-CM

## 2017-08-21 DIAGNOSIS — E119 Type 2 diabetes mellitus without complications: Secondary | ICD-10-CM | POA: Diagnosis not present

## 2017-08-21 DIAGNOSIS — E8881 Metabolic syndrome: Secondary | ICD-10-CM

## 2017-08-21 DIAGNOSIS — M25512 Pain in left shoulder: Secondary | ICD-10-CM

## 2017-08-21 DIAGNOSIS — I10 Essential (primary) hypertension: Secondary | ICD-10-CM | POA: Diagnosis not present

## 2017-08-21 DIAGNOSIS — J309 Allergic rhinitis, unspecified: Secondary | ICD-10-CM | POA: Diagnosis not present

## 2017-08-21 DIAGNOSIS — K2100 Gastro-esophageal reflux disease with esophagitis, without bleeding: Secondary | ICD-10-CM

## 2017-08-21 LAB — BAYER DCA HB A1C WAIVED: HB A1C: 5.4 % (ref <7.0)

## 2017-08-21 MED ORDER — NAPROXEN 500 MG PO TABS: 500.0000 mg | ORAL_TABLET | Freq: Two times a day (BID) | ORAL | 1 refills | Status: DC

## 2017-08-21 MED ORDER — METFORMIN HCL ER 500 MG PO TB24: 500.0000 mg | ORAL_TABLET | Freq: Every day | ORAL | 1 refills | Status: DC

## 2017-08-21 MED ORDER — LISINOPRIL-HYDROCHLOROTHIAZIDE 20-25 MG PO TABS: 1.0000 | ORAL_TABLET | Freq: Every day | ORAL | 1 refills | Status: DC

## 2017-08-21 MED ORDER — FLUTICASONE PROPIONATE 50 MCG/ACT NA SUSP: 2.0000 | Freq: Every day | NASAL | 6 refills | Status: DC

## 2017-08-21 MED ORDER — OMEPRAZOLE 40 MG PO CPDR: DELAYED_RELEASE_CAPSULE | ORAL | 1 refills | Status: DC

## 2017-08-21 MED ORDER — AMLODIPINE BESYLATE 5 MG PO TABS: 5.0000 mg | ORAL_TABLET | Freq: Every day | ORAL | 1 refills | Status: DC

## 2017-08-21 MED ORDER — GABAPENTIN 300 MG PO CAPS: ORAL_CAPSULE | ORAL | 5 refills | Status: DC

## 2017-08-21 MED ORDER — SIMVASTATIN 40 MG PO TABS: 40.0000 mg | ORAL_TABLET | Freq: Every day | ORAL | 1 refills | Status: DC

## 2017-08-21 NOTE — Progress Notes (Signed)
Subjective:    Patient ID: Evan Smith, male    DOB: September 10, 1955, 62 y.o.   MRN: [id number]   Chief Complaint: medical management of chronic issues  HPI:  1. Diabetes without complications Last RXVQ0G was 5.7%. He does not check blood sugars at home.   2. Essential hypertension, benign  No c/o chest pain, sob or headache. Does not check blood pressure at home. BP Readings from Last 3 Encounters:  04/08/17 129/75  03/27/17 122/77  03/27/17 122/77     3. Mixed hyperlipidemia  Does not watch diet at all and does no exercise  4. Obstructive sleep apnea syndrome  Wears cpap noghtly- does not sleep well when he does not wear  5. Severe obesity (BMI >= 40) (HCC)  No recent weight changes  6.      Allergic rhinitis           Bis on zyrtec and says has not seen working well for him.   Outpatient Encounter Medications as of 08/21/2017  Medication Sig  . amLODipine (NORVASC) 5 MG tablet Take 1 tablet (5 mg total) by mouth daily.  Marland Kitchen aspirin 81 MG tablet Take 81 mg by mouth daily.  . cetirizine (ZYRTEC) 10 MG tablet Take 1 tablet (10 mg total) by mouth daily.  . Cholecalciferol (VITAMIN D3) 1000 UNITS CAPS Take 1 capsule by mouth daily.   . diclofenac sodium (VOLTAREN) 1 % GEL Apply 4 g topically 4 (four) times daily.  Marland Kitchen gabapentin (NEURONTIN) 300 MG capsule TAKE ONE CAPSULE BY MOUTH ONCE DAILY AT BEDTIME  . ibuprofen (ADVIL,MOTRIN) 200 MG tablet Take 200 mg by mouth 2 (two) times daily as needed.  Marland Kitchen lisinopril-hydrochlorothiazide (PRINZIDE,ZESTORETIC) 20-25 MG tablet Take 1 tablet by mouth daily.  . metFORMIN (GLUCOPHAGE-XR) 500 MG 24 hr tablet Take 1 tablet (500 mg total) by mouth daily with breakfast.  . omeprazole (PRILOSEC) 40 MG capsule TAKE ONE CAPSULE BY MOUTH ONCE DAILY IN THE MORNING  . Potassium Gluconate 80 MG TABS Take 99 mg by mouth.  . simvastatin (ZOCOR) 40 MG tablet Take 1 tablet (40 mg total) by mouth at bedtime.  . tadalafil (CIALIS) 20 MG tablet Take 0.5-1  tablets (10-20 mg total) by mouth every other day as needed for erectile dysfunction.       New complaints: Patient said he was closing a bin in the yard and felt pop in left shoulder and shlouder has been hurting every since.  Social history: Has been on disability for back pain. Has trouble doing any strenuous activities   Review of Systems  Constitutional: Negative for activity change and appetite change.  HENT: Negative.   Eyes: Negative for pain.  Respiratory: Negative for shortness of breath.   Cardiovascular: Negative for chest pain, palpitations and leg swelling.  Gastrointestinal: Negative for abdominal pain.  Endocrine: Negative for polydipsia.  Genitourinary: Negative.   Musculoskeletal: Positive for back pain (chronic).  Skin: Negative for rash.  Neurological: Negative for dizziness, weakness and headaches.  Hematological: Does not bruise/bleed easily.  Psychiatric/Behavioral: Negative.   All other systems reviewed and are negative.      Objective:   Physical Exam  Constitutional: He is oriented to person, place, and time. He appears well-developed and well-nourished.  HENT:  Head: Normocephalic.  Nose: Nose normal.  Mouth/Throat: Oropharynx is clear and moist.  Eyes: Pupils are equal, round, and reactive to light. EOM are normal.  Neck: Normal range of motion and phonation normal. Neck supple. No JVD present. Carotid  bruit is not present. No thyroid mass and no thyromegaly present.  Cardiovascular: Normal rate and regular rhythm.  Pulmonary/Chest: Effort normal and breath sounds normal. No respiratory distress.  Abdominal: Soft. Normal appearance, normal aorta and bowel sounds are normal. There is no tenderness.  Musculoskeletal: Normal range of motion.  Decreased ROM passive motion of left shoulder due to pain on external rotation and abduction. No point tenderness on palpation.  Lymphadenopathy:    He has no cervical adenopathy.  Neurological: He is  alert and oriented to person, place, and time.  Skin: Skin is warm and dry. Capillary refill takes less than 2 seconds.  Psychiatric: He has a normal mood and affect. His behavior is normal. Judgment and thought content normal.  Nursing note and vitals reviewed.  BP 113/69   Pulse 91   Temp (!) 96.8 F (36 C) (Oral)   Ht '5\' 8"'  (1.727 m)   Wt 292 lb (132.5 kg)   BMI 44.40 kg/m   hgba1c 5.4%       Assessment & Plan:  Evan Smith comes in today with chief complaint of Medical Management of Chronic Issues   Diagnosis and orders addressed:  1. Diabetes mellitis without complications Watch carbs in diet - Bayer DCA Hb A1c Waived - metFORMIN (GLUCOPHAGE-XR) 500 MG 24 hr tablet; Take 1 tablet (500 mg total) by mouth daily with breakfast.  Dispense: 90 tablet; Refill: 1  2. Essential hypertension, benign Low sodium diet - CMP14+EGFR - amLODipine (NORVASC) 5 MG tablet; Take 1 tablet (5 mg total) by mouth daily.  Dispense: 90 tablet; Refill: 1 - lisinopril-hydrochlorothiazide (PRINZIDE,ZESTORETIC) 20-25 MG tablet; Take 1 tablet by mouth daily.  Dispense: 90 tablet; Refill: 1  3. Mixed hyperlipidemia Low fat diet - Lipid panel - simvastatin (ZOCOR) 40 MG tablet; Take 1 tablet (40 mg total) by mouth at bedtime.  Dispense: 90 tablet; Refill: 1  4. Obstructive sleep apnea syndrome Continue to wear cpap nightly  5. Severe obesity (BMI >= 40) (HCC) Discussed diet and exercise for person with BMI >25 Will recheck weight in 3-6 months   6. Allergic rhinitis, unspecified seasonality, unspecified trigger Sent in flonase rx Proper use of nasal spray discussed  8. Gastroesophageal reflux disease with esophagitis Avoid spicy foods Do not eat 2 hours prior to bedtime - omeprazole (PRILOSEC) 40 MG capsule; TAKE ONE CAPSULE BY MOUTH ONCE DAILY IN THE MORNING  Dispense: 90 capsule; Refill: 1  9. Neuropathy Do no go barefooted Check feet dialy - gabapentin (NEURONTIN) 300 MG  capsule; TAKE ONE CAPSULE BY MOUTH ONCE DAILY AT BEDTIME  Dispense: 90 capsule; Refill: 5  10. Pain in joint of left shoulder Rest Ice bid If no better in 1-2 weeks will do ortho referral - naproxen (NAPROSYN) 500 MG tablet; Take 1 tablet (500 mg total) by mouth 2 (two) times daily with a meal.  Dispense: 30 tablet; Refill: 1   Labs pending Health Maintenance reviewed Diet and exercise encouraged  Follow up plan: 3 months   Mary-Margaret Hassell Done, FNP

## 2017-08-21 NOTE — Patient Instructions (Signed)
Shoulder Pain Many things can cause shoulder pain, including:  An injury.  Moving the arm in the same way again and again (overuse).  Joint pain (arthritis).  Follow these instructions at home: Take these actions to help with your pain:  Squeeze a soft ball or a foam pad as much as you can. This helps to prevent swelling. It also makes the arm stronger.  Take over-the-counter and prescription medicines only as told by your doctor.  If told, put ice on the area: ? Put ice in a plastic bag. ? Place a towel between your skin and the bag. ? Leave the ice on for 20 minutes, 2-3 times per day. Stop putting on ice if it does not help with the pain.  If you were given a shoulder sling or immobilizer: ? Wear it as told. ? Remove it to shower or bathe. ? Move your arm as little as possible. ? Keep your hand moving. This helps prevent swelling.  Contact a doctor if:  Your pain gets worse.  Medicine does not help your pain.  You have new pain in your arm, hand, or fingers. Get help right away if:  Your arm, hand, or fingers: ? Tingle. ? Are numb. ? Are swollen. ? Are painful. ? Turn white or blue. This information is not intended to replace advice given to you by your health care provider. Make sure you discuss any questions you have with your health care provider. Document Released: 07/17/2007 Document Revised: 09/24/2015 Document Reviewed: 05/23/2014 Elsevier Interactive Patient Education  2018 Elsevier Inc.  

## 2017-08-27 NOTE — Progress Notes (Signed)
Left detailed message to come in and get lab work done

## 2017-08-29 ENCOUNTER — Other Ambulatory Visit: Payer: Self-pay | Admitting: Nurse Practitioner

## 2017-11-14 ENCOUNTER — Ambulatory Visit: Payer: Medicare HMO | Admitting: Nurse Practitioner

## 2017-11-17 ENCOUNTER — Other Ambulatory Visit: Payer: Self-pay | Admitting: Nurse Practitioner

## 2017-11-17 DIAGNOSIS — M25512 Pain in left shoulder: Secondary | ICD-10-CM

## 2017-11-25 ENCOUNTER — Other Ambulatory Visit: Payer: Self-pay | Admitting: Nurse Practitioner

## 2017-11-25 DIAGNOSIS — E8881 Metabolic syndrome: Secondary | ICD-10-CM

## 2017-11-25 DIAGNOSIS — I10 Essential (primary) hypertension: Secondary | ICD-10-CM

## 2017-11-25 DIAGNOSIS — K2100 Gastro-esophageal reflux disease with esophagitis, without bleeding: Secondary | ICD-10-CM

## 2017-11-25 DIAGNOSIS — E782 Mixed hyperlipidemia: Secondary | ICD-10-CM

## 2017-12-02 ENCOUNTER — Ambulatory Visit (INDEPENDENT_AMBULATORY_CARE_PROVIDER_SITE_OTHER): Payer: Medicare HMO | Admitting: Nurse Practitioner

## 2017-12-02 ENCOUNTER — Encounter: Payer: Self-pay | Admitting: Nurse Practitioner

## 2017-12-02 VITALS — BP 116/76 | HR 90 | Temp 98.6°F | Ht 68.0 in | Wt 283.0 lb

## 2017-12-02 DIAGNOSIS — E782 Mixed hyperlipidemia: Secondary | ICD-10-CM

## 2017-12-02 DIAGNOSIS — I10 Essential (primary) hypertension: Secondary | ICD-10-CM

## 2017-12-02 DIAGNOSIS — E119 Type 2 diabetes mellitus without complications: Secondary | ICD-10-CM

## 2017-12-02 DIAGNOSIS — G4733 Obstructive sleep apnea (adult) (pediatric): Secondary | ICD-10-CM

## 2017-12-02 DIAGNOSIS — K2100 Gastro-esophageal reflux disease with esophagitis, without bleeding: Secondary | ICD-10-CM

## 2017-12-02 DIAGNOSIS — G629 Polyneuropathy, unspecified: Secondary | ICD-10-CM | POA: Diagnosis not present

## 2017-12-02 DIAGNOSIS — Z23 Encounter for immunization: Secondary | ICD-10-CM | POA: Diagnosis not present

## 2017-12-02 DIAGNOSIS — J309 Allergic rhinitis, unspecified: Secondary | ICD-10-CM | POA: Diagnosis not present

## 2017-12-02 DIAGNOSIS — N5201 Erectile dysfunction due to arterial insufficiency: Secondary | ICD-10-CM

## 2017-12-02 LAB — BAYER DCA HB A1C WAIVED: HB A1C (BAYER DCA - WAIVED): 5.5 % (ref <7.0)

## 2017-12-02 MED ORDER — OMEPRAZOLE 40 MG PO CPDR: DELAYED_RELEASE_CAPSULE | ORAL | 0 refills | Status: DC

## 2017-12-02 MED ORDER — METFORMIN HCL ER 500 MG PO TB24: ORAL_TABLET | ORAL | 1 refills | Status: DC

## 2017-12-02 MED ORDER — LORATADINE 10 MG PO TABS: 10.0000 mg | ORAL_TABLET | Freq: Every day | ORAL | 11 refills | Status: DC

## 2017-12-02 MED ORDER — SIMVASTATIN 40 MG PO TABS: 40.0000 mg | ORAL_TABLET | Freq: Every day | ORAL | 1 refills | Status: DC

## 2017-12-02 MED ORDER — LISINOPRIL-HYDROCHLOROTHIAZIDE 20-25 MG PO TABS: 1.0000 | ORAL_TABLET | Freq: Every day | ORAL | 1 refills | Status: DC

## 2017-12-02 MED ORDER — AMLODIPINE BESYLATE 5 MG PO TABS: 5.0000 mg | ORAL_TABLET | Freq: Every day | ORAL | 1 refills | Status: DC

## 2017-12-02 MED ORDER — SILDENAFIL CITRATE 20 MG PO TABS: ORAL_TABLET | ORAL | 5 refills | Status: DC

## 2017-12-02 NOTE — Patient Instructions (Signed)

## 2017-12-02 NOTE — Progress Notes (Signed)
Subjective:    Patient ID: Evan Smith, male    DOB: 1955-05-15, 62 y.o.   MRN: [id number]   Chief Complaint: Medical Management of Chronic Issues (Wants new allegy med)   HPI:  1. Diabetes mellitus without complication ([place])  Last hgba1c was 5.4%. He does not check blood sugars every day. He also does not watch diet very closely.  2. Essential hypertension, benign  No c/o chest pain, sob or headache. Does ot check blood pressure at home. BP Readings from Last 3 Encounters:  12/02/17 116/76  08/21/17 113/69  04/08/17 129/75     3. Mixed hyperlipidemia  Does not watch diet or exercise  4. Obstructive sleep apnea syndrome  Wears CPAP nightly. Feels rested  5. Allergic rhinitis, unspecified seasonality, unspecified trigger  Has been bad the last several days. Has been on zyrtec which has not been working- would like to try something else.  6. Severe obesity (BMI >= 40) (HCC)  No recent weight changes  7. Neuropathy  Numbness in fingers    Outpatient Encounter Medications as of 12/02/2017  Medication Sig  . amLODipine (NORVASC) 5 MG tablet TAKE 1 TABLET EVERY DAY  . aspirin 81 MG tablet Take 81 mg by mouth daily.  . cetirizine (ZYRTEC) 10 MG tablet TAKE 1 TABLET BY MOUTH ONCE DAILY  . Cholecalciferol (VITAMIN D3) 1000 UNITS CAPS Take 1 capsule by mouth daily.   . diclofenac sodium (VOLTAREN) 1 % GEL Apply 4 g topically 4 (four) times daily.  . fluticasone (FLONASE) 50 MCG/ACT nasal spray Place 2 sprays into both nostrils daily.  Marland Kitchen gabapentin (NEURONTIN) 300 MG capsule TAKE ONE CAPSULE BY MOUTH ONCE DAILY AT BEDTIME  . ibuprofen (ADVIL,MOTRIN) 200 MG tablet Take 200 mg by mouth 2 (two) times daily as needed.  Marland Kitchen lisinopril-hydrochlorothiazide (PRINZIDE,ZESTORETIC) 20-25 MG tablet TAKE 1 TABLET EVERY DAY  . metFORMIN (GLUCOPHAGE-XR) 500 MG 24 hr tablet TAKE 1 TABLET WITH BREAKFAST  . naproxen (NAPROSYN) 500 MG tablet TAKE 1 TABLET TWICE DAILY WITH MEALS  . omeprazole  (PRILOSEC) 40 MG capsule TAKE 1 CAPSULE IN THE MORNING  . Potassium Gluconate 80 MG TABS Take 99 mg by mouth.  . simvastatin (ZOCOR) 40 MG tablet TAKE 1 TABLET AT BEDTIME  . tadalafil (CIALIS) 20 MG tablet Take 0.5-1 tablets (10-20 mg total) by mouth every other day as needed for erectile dysfunction.      New complaints: Having ED. Has tried cialis in the past but can not afford.  Social history: Lives with his nephew. He is on disabilty for back problems.   Review of Systems  Constitutional: Negative for activity change and appetite change.  HENT: Positive for congestion and rhinorrhea.   Eyes: Negative for pain.  Respiratory: Negative for cough and shortness of breath.   Cardiovascular: Negative for chest pain, palpitations and leg swelling.  Gastrointestinal: Negative for abdominal pain.  Endocrine: Negative for polydipsia.  Genitourinary: Negative.   Skin: Negative for rash.  Neurological: Negative for dizziness, weakness and headaches.  Hematological: Does not bruise/bleed easily.  Psychiatric/Behavioral: Negative.   All other systems reviewed and are negative.      Objective:   Physical Exam  Constitutional: He is oriented to person, place, and time. He appears well-developed and well-nourished.  HENT:  Head: Normocephalic.  Nose: Mucosal edema and rhinorrhea present. Right sinus exhibits no maxillary sinus tenderness and no frontal sinus tenderness. Left sinus exhibits no maxillary sinus tenderness and no frontal sinus tenderness.  Mouth/Throat: Oropharynx is  clear and moist.  Eyes: Pupils are equal, round, and reactive to light. EOM are normal.  Neck: Normal range of motion and phonation normal. Neck supple. No JVD present. Carotid bruit is not present. No thyroid mass and no thyromegaly present.  Cardiovascular: Normal rate and regular rhythm.  Pulmonary/Chest: Effort normal and breath sounds normal. No respiratory distress.  Abdominal: Soft. Normal appearance,  normal aorta and bowel sounds are normal. There is no tenderness.  Musculoskeletal: Normal range of motion.  Lymphadenopathy:    He has no cervical adenopathy.  Neurological: He is alert and oriented to person, place, and time.  Skin: Skin is warm and dry.  Psychiatric: He has a normal mood and affect. His behavior is normal. Judgment and thought content normal.  Nursing note and vitals reviewed.  BP 116/76   Pulse 90   Temp 98.6 F (37 C) (Oral)   Ht '5\' 8"'  (1.727 m)   Wt 283 lb (128.4 kg)   BMI 43.03 kg/m   hgba1c 5.5%      Assessment & Plan:  Evan Smith comes in today with chief complaint of Medical Management of Chronic Issues (Wants new allegy med)   Diagnosis and orders addressed:  1. Diabetes mellitus without complication ([place]) Continue to watch carbsin diet - Bayer DCA Hb A1c Waived - metFORMIN (GLUCOPHAGE-XR) 500 MG 24 hr tablet; TAKE 1 TABLET WITH BREAKFAST  Dispense: 90 tablet; Refill: 1  2. Essential hypertension, benign Low sodium diet - CMP14+EGFR - lisinopril-hydrochlorothiazide (PRINZIDE,ZESTORETIC) 20-25 MG tablet; Take 1 tablet by mouth daily.  Dispense: 90 tablet; Refill: 1 - amLODipine (NORVASC) 5 MG tablet; Take 1 tablet (5 mg total) by mouth daily.  Dispense: 90 tablet; Refill: 1  3. Mixed hyperlipidemia Low fat diet - Lipid panel - simvastatin (ZOCOR) 40 MG tablet; Take 1 tablet (40 mg total) by mouth at bedtime.  Dispense: 90 tablet; Refill: 1  4. Obstructive sleep apnea syndrome continue to wear CPAP nightly  5. Allergic rhinitis, unspecified seasonality, unspecified trigger changed from zytrec to loratadine - loratadine (CLARITIN) 10 MG tablet; Take 1 tablet (10 mg total) by mouth daily.  Dispense: 30 tablet; Refill: 11  6. Severe obesity (BMI >= 40) (HCC) Discussed diet and exercise for person with BMI >25 Will recheck weight in 3-6 months  7. Neuropathy continue neuronin as rx  8. Gastroesophageal reflux disease with  esophagitis Avoid spicy foods Do not eat 2 hours prior to bedtime - omeprazole (PRILOSEC) 40 MG capsule; TAKE 1 CAPSULE IN THE MORNING  Dispense: 90 capsule; Refill: 0  9. Erectile dysfunction due to arterial insufficiency - sildenafil (REVATIO) 20 MG tablet; 1-2 tablets daily prn  Dispense: 30 tablet; Refill: 5   Labs pending Health Maintenance reviewed Diet and exercise encouraged  Follow up plan: 3 months   Mary-Margaret Hassell Done, FNP

## 2017-12-02 NOTE — Addendum Note (Signed)
Addended by: Cleda Daub on: 12/02/2017 03:58 PM   Modules accepted: Orders

## 2017-12-03 LAB — CMP14+EGFR
ALBUMIN: 4.2 g/dL (ref 3.6–4.8)
ALT: 18 IU/L (ref 0–44)
AST: 21 IU/L (ref 0–40)
Albumin/Globulin Ratio: 1.8 (ref 1.2–2.2)
Alkaline Phosphatase: 56 IU/L (ref 39–117)
BUN / CREAT RATIO: 15 (ref 10–24)
BUN: 24 mg/dL (ref 8–27)
Bilirubin Total: 0.2 mg/dL (ref 0.0–1.2)
CALCIUM: 9.3 mg/dL (ref 8.6–10.2)
CO2: 22 mmol/L (ref 20–29)
CREATININE: 1.59 mg/dL — AB (ref 0.76–1.27)
Chloride: 103 mmol/L (ref 96–106)
GFR, EST AFRICAN AMERICAN: 53 mL/min/{1.73_m2} — AB (ref >59)
GFR, EST NON AFRICAN AMERICAN: 46 mL/min/{1.73_m2} — AB (ref >59)
GLUCOSE: 82 mg/dL (ref 65–99)
Globulin, Total: 2.4 g/dL (ref 1.5–4.5)
Potassium: 4.7 mmol/L (ref 3.5–5.2)
Sodium: 141 mmol/L (ref 134–144)
TOTAL PROTEIN: 6.6 g/dL (ref 6.0–8.5)

## 2017-12-03 LAB — LIPID PANEL
Chol/HDL Ratio: 2.7 ratio (ref 0.0–5.0)
Cholesterol, Total: 145 mg/dL (ref 100–199)
HDL: 54 mg/dL (ref >39)
LDL CALC: 50 mg/dL (ref 0–99)
Triglycerides: 204 mg/dL — ABNORMAL HIGH (ref 0–149)
VLDL Cholesterol Cal: 41 mg/dL — ABNORMAL HIGH (ref 5–40)

## 2018-01-13 DIAGNOSIS — H5210 Myopia, unspecified eye: Secondary | ICD-10-CM | POA: Diagnosis not present

## 2018-01-13 DIAGNOSIS — I10 Essential (primary) hypertension: Secondary | ICD-10-CM | POA: Diagnosis not present

## 2018-01-13 LAB — HM DIABETES EYE EXAM

## 2018-01-21 ENCOUNTER — Other Ambulatory Visit: Payer: Self-pay | Admitting: Nurse Practitioner

## 2018-01-21 DIAGNOSIS — M25512 Pain in left shoulder: Secondary | ICD-10-CM

## 2018-03-05 ENCOUNTER — Ambulatory Visit (INDEPENDENT_AMBULATORY_CARE_PROVIDER_SITE_OTHER): Payer: Medicare HMO | Admitting: Nurse Practitioner

## 2018-03-05 ENCOUNTER — Encounter: Payer: Self-pay | Admitting: Nurse Practitioner

## 2018-03-05 VITALS — BP 123/74 | HR 82 | Temp 97.3°F | Ht 68.0 in | Wt 283.0 lb

## 2018-03-05 DIAGNOSIS — I10 Essential (primary) hypertension: Secondary | ICD-10-CM | POA: Diagnosis not present

## 2018-03-05 DIAGNOSIS — E119 Type 2 diabetes mellitus without complications: Secondary | ICD-10-CM

## 2018-03-05 DIAGNOSIS — K2100 Gastro-esophageal reflux disease with esophagitis, without bleeding: Secondary | ICD-10-CM

## 2018-03-05 DIAGNOSIS — G4734 Idiopathic sleep related nonobstructive alveolar hypoventilation: Secondary | ICD-10-CM

## 2018-03-05 DIAGNOSIS — G629 Polyneuropathy, unspecified: Secondary | ICD-10-CM | POA: Diagnosis not present

## 2018-03-05 DIAGNOSIS — E782 Mixed hyperlipidemia: Secondary | ICD-10-CM

## 2018-03-05 DIAGNOSIS — N5201 Erectile dysfunction due to arterial insufficiency: Secondary | ICD-10-CM

## 2018-03-05 LAB — BAYER DCA HB A1C WAIVED: HB A1C (BAYER DCA - WAIVED): 5.5 % (ref <7.0)

## 2018-03-05 MED ORDER — OMEPRAZOLE 40 MG PO CPDR: DELAYED_RELEASE_CAPSULE | ORAL | 1 refills | Status: DC

## 2018-03-05 MED ORDER — SIMVASTATIN 40 MG PO TABS: 40.0000 mg | ORAL_TABLET | Freq: Every day | ORAL | 1 refills | Status: DC

## 2018-03-05 MED ORDER — GABAPENTIN 300 MG PO CAPS: ORAL_CAPSULE | ORAL | 5 refills | Status: DC

## 2018-03-05 MED ORDER — SILDENAFIL CITRATE 20 MG PO TABS: ORAL_TABLET | ORAL | 5 refills | Status: DC

## 2018-03-05 MED ORDER — FLUTICASONE PROPIONATE 50 MCG/ACT NA SUSP: 2.0000 | Freq: Every day | NASAL | 1 refills | Status: DC

## 2018-03-05 MED ORDER — LISINOPRIL-HYDROCHLOROTHIAZIDE 20-25 MG PO TABS: 1.0000 | ORAL_TABLET | Freq: Every day | ORAL | 1 refills | Status: DC

## 2018-03-05 MED ORDER — METFORMIN HCL ER 500 MG PO TB24: ORAL_TABLET | ORAL | 1 refills | Status: DC

## 2018-03-05 MED ORDER — AMLODIPINE BESYLATE 5 MG PO TABS: 5.0000 mg | ORAL_TABLET | Freq: Every day | ORAL | 1 refills | Status: DC

## 2018-03-05 NOTE — Patient Instructions (Signed)
DASH Eating Plan  DASH stands for "Dietary Approaches to Stop Hypertension." The DASH eating plan is a healthy eating plan that has been shown to reduce high blood pressure (hypertension). It may also reduce your risk for type 2 diabetes, heart disease, and stroke. The DASH eating plan may also help with weight loss.  What are tips for following this plan?    General guidelines   Avoid eating more than 2,300 mg (milligrams) of salt (sodium) a day. If you have hypertension, you may need to reduce your sodium intake to 1,500 mg a day.   Limit alcohol intake to no more than 1 drink a day for nonpregnant women and 2 drinks a day for men. One drink equals 12 oz of beer, 5 oz of wine, or 1 oz of hard liquor.   Work with your health care provider to maintain a healthy body weight or to lose weight. Ask what an ideal weight is for you.   Get at least 30 minutes of exercise that causes your heart to beat faster (aerobic exercise) most days of the week. Activities may include walking, swimming, or biking.   Work with your health care provider or diet and nutrition specialist (dietitian) to adjust your eating plan to your individual calorie needs.  Reading food labels     Check food labels for the amount of sodium per serving. Choose foods with less than 5 percent of the Daily Value of sodium. Generally, foods with less than 300 mg of sodium per serving fit into this eating plan.   To find whole grains, look for the word "whole" as the first word in the ingredient list.  Shopping   Buy products labeled as "low-sodium" or "no salt added."   Buy fresh foods. Avoid canned foods and premade or frozen meals.  Cooking   Avoid adding salt when cooking. Use salt-free seasonings or herbs instead of table salt or sea salt. Check with your health care provider or pharmacist before using salt substitutes.   Do not fry foods. Cook foods using healthy methods such as baking, boiling, grilling, and broiling instead.   Cook with  heart-healthy oils, such as olive, canola, soybean, or sunflower oil.  Meal planning   Eat a balanced diet that includes:  ? 5 or more servings of fruits and vegetables each day. At each meal, try to fill half of your plate with fruits and vegetables.  ? Up to 6-8 servings of whole grains each day.  ? Less than 6 oz of lean meat, poultry, or fish each day. A 3-oz serving of meat is about the same size as a deck of cards. One egg equals 1 oz.  ? 2 servings of low-fat dairy each day.  ? A serving of nuts, seeds, or beans 5 times each week.  ? Heart-healthy fats. Healthy fats called Omega-3 fatty acids are found in foods such as flaxseeds and coldwater fish, like sardines, salmon, and mackerel.   Limit how much you eat of the following:  ? Canned or prepackaged foods.  ? Food that is high in trans fat, such as fried foods.  ? Food that is high in saturated fat, such as fatty meat.  ? Sweets, desserts, sugary drinks, and other foods with added sugar.  ? Full-fat dairy products.   Do not salt foods before eating.   Try to eat at least 2 vegetarian meals each week.   Eat more home-cooked food and less restaurant, buffet, and fast food.     When eating at a restaurant, ask that your food be prepared with less salt or no salt, if possible.  What foods are recommended?  The items listed may not be a complete list. Talk with your dietitian about what dietary choices are best for you.  Grains  Whole-grain or whole-wheat bread. Whole-grain or whole-wheat pasta. Brown rice. Oatmeal. Quinoa. Bulgur. Whole-grain and low-sodium cereals. Pita bread. Low-fat, low-sodium crackers. Whole-wheat flour tortillas.  Vegetables  Fresh or frozen vegetables (raw, steamed, roasted, or grilled). Low-sodium or reduced-sodium tomato and vegetable juice. Low-sodium or reduced-sodium tomato sauce and tomato paste. Low-sodium or reduced-sodium canned vegetables.  Fruits  All fresh, dried, or frozen fruit. Canned fruit in natural juice (without  added sugar).  Meat and other protein foods  Skinless chicken or turkey. Ground chicken or turkey. Pork with fat trimmed off. Fish and seafood. Egg whites. Dried beans, peas, or lentils. Unsalted nuts, nut butters, and seeds. Unsalted canned beans. Lean cuts of beef with fat trimmed off. Low-sodium, lean deli meat.  Dairy  Low-fat (1%) or fat-free (skim) milk. Fat-free, low-fat, or reduced-fat cheeses. Nonfat, low-sodium ricotta or cottage cheese. Low-fat or nonfat yogurt. Low-fat, low-sodium cheese.  Fats and oils  Soft margarine without trans fats. Vegetable oil. Low-fat, reduced-fat, or light mayonnaise and salad dressings (reduced-sodium). Canola, safflower, olive, soybean, and sunflower oils. Avocado.  Seasoning and other foods  Herbs. Spices. Seasoning mixes without salt. Unsalted popcorn and pretzels. Fat-free sweets.  What foods are not recommended?  The items listed may not be a complete list. Talk with your dietitian about what dietary choices are best for you.  Grains  Baked goods made with fat, such as croissants, muffins, or some breads. Dry pasta or rice meal packs.  Vegetables  Creamed or fried vegetables. Vegetables in a cheese sauce. Regular canned vegetables (not low-sodium or reduced-sodium). Regular canned tomato sauce and paste (not low-sodium or reduced-sodium). Regular tomato and vegetable juice (not low-sodium or reduced-sodium). Pickles. Olives.  Fruits  Canned fruit in a light or heavy syrup. Fried fruit. Fruit in cream or butter sauce.  Meat and other protein foods  Fatty cuts of meat. Ribs. Fried meat. Bacon. Sausage. Bologna and other processed lunch meats. Salami. Fatback. Hotdogs. Bratwurst. Salted nuts and seeds. Canned beans with added salt. Canned or smoked fish. Whole eggs or egg yolks. Chicken or turkey with skin.  Dairy  Whole or 2% milk, cream, and half-and-half. Whole or full-fat cream cheese. Whole-fat or sweetened yogurt. Full-fat cheese. Nondairy creamers. Whipped toppings.  Processed cheese and cheese spreads.  Fats and oils  Butter. Stick margarine. Lard. Shortening. Ghee. Bacon fat. Tropical oils, such as coconut, palm kernel, or palm oil.  Seasoning and other foods  Salted popcorn and pretzels. Onion salt, garlic salt, seasoned salt, table salt, and sea salt. Worcestershire sauce. Tartar sauce. Barbecue sauce. Teriyaki sauce. Soy sauce, including reduced-sodium. Steak sauce. Canned and packaged gravies. Fish sauce. Oyster sauce. Cocktail sauce. Horseradish that you find on the shelf. Ketchup. Mustard. Meat flavorings and tenderizers. Bouillon cubes. Hot sauce and Tabasco sauce. Premade or packaged marinades. Premade or packaged taco seasonings. Relishes. Regular salad dressings.  Where to find more information:   National Heart, Lung, and Blood Institute: www.nhlbi.nih.gov   American Heart Association: www.heart.org  Summary   The DASH eating plan is a healthy eating plan that has been shown to reduce high blood pressure (hypertension). It may also reduce your risk for type 2 diabetes, heart disease, and stroke.   With the   DASH eating plan, you should limit salt (sodium) intake to 2,300 mg a day. If you have hypertension, you may need to reduce your sodium intake to 1,500 mg a day.   When on the DASH eating plan, aim to eat more fresh fruits and vegetables, whole grains, lean proteins, low-fat dairy, and heart-healthy fats.   Work with your health care provider or diet and nutrition specialist (dietitian) to adjust your eating plan to your individual calorie needs.  This information is not intended to replace advice given to you by your health care provider. Make sure you discuss any questions you have with your health care provider.  Document Released: 01/17/2011 Document Revised: 01/22/2016 Document Reviewed: 01/22/2016  Elsevier Interactive Patient Education  2019 [employer]

## 2018-03-05 NOTE — Progress Notes (Signed)
Subjective:    Patient ID: Evan Smith, male    DOB: Apr 20, 1955, 63 y.o.   MRN: [id number]   Chief Complaint: medical management of chronic issues  HPI:  1. Essential hypertension  no c/o chest pain, sob or headache. Does not check blood pressure at home. BP Readings from Last 3 Encounters:  12/02/17 116/76  08/21/17 113/69  04/08/17 129/75     2. Diabetes mellitus without complication ([facility])  Last hgba1c was 5.5%. he doe not check blood sugars everyday. He denies any symptoms of low blood sugar.  3. Neuropathy  Has constant burning sensation in bil feet  4. Mixed hyperlipidemia  Does not watch diet and does no exercise  5. Severe obesity (BMI >= 40) (HCC)  No recent weight changes    Outpatient Encounter Medications as of 03/05/2018  Medication Sig  . amLODipine (NORVASC) 5 MG tablet Take 1 tablet (5 mg total) by mouth daily.  Marland Kitchen aspirin 81 MG tablet Take 81 mg by mouth daily.  . Cholecalciferol (VITAMIN D3) 1000 UNITS CAPS Take 1 capsule by mouth daily.   . diclofenac sodium (VOLTAREN) 1 % GEL Apply 4 g topically 4 (four) times daily.  . fluticasone (FLONASE) 50 MCG/ACT nasal spray Place 2 sprays into both nostrils daily.  Marland Kitchen gabapentin (NEURONTIN) 300 MG capsule TAKE ONE CAPSULE BY MOUTH ONCE DAILY AT BEDTIME  . ibuprofen (ADVIL,MOTRIN) 200 MG tablet Take 200 mg by mouth 2 (two) times daily as needed.  Marland Kitchen lisinopril-hydrochlorothiazide (PRINZIDE,ZESTORETIC) 20-25 MG tablet Take 1 tablet by mouth daily.  Marland Kitchen loratadine (CLARITIN) 10 MG tablet Take 1 tablet (10 mg total) by mouth daily.  . metFORMIN (GLUCOPHAGE-XR) 500 MG 24 hr tablet TAKE 1 TABLET WITH BREAKFAST  . naproxen (NAPROSYN) 500 MG tablet TAKE 1 TABLET TWICE DAILY WITH MEALS  . omeprazole (PRILOSEC) 40 MG capsule TAKE 1 CAPSULE IN THE MORNING  . Potassium Gluconate 80 MG TABS Take 99 mg by mouth.  . sildenafil (REVATIO) 20 MG tablet 1-2 tablets daily prn  . simvastatin (ZOCOR) 40 MG tablet Take 1 tablet (40 mg  total) by mouth at bedtime.     New complaints: None today  Social history: Is on disability   Review of Systems  Constitutional: Negative for activity change and appetite change.  HENT: Negative.   Eyes: Negative for pain.  Respiratory: Negative for shortness of breath.   Cardiovascular: Negative for chest pain, palpitations and leg swelling.  Gastrointestinal: Negative for abdominal pain.  Endocrine: Negative for polydipsia.  Genitourinary: Negative.   Skin: Negative for rash.  Neurological: Negative for dizziness, weakness and headaches.  Hematological: Does not bruise/bleed easily.  Psychiatric/Behavioral: Negative.   All other systems reviewed and are negative.      Objective:   Physical Exam Vitals signs and nursing note reviewed.  Constitutional:      Appearance: Normal appearance. He is well-developed.  HENT:     Head: Normocephalic.     Nose: Nose normal.  Eyes:     Pupils: Pupils are equal, round, and reactive to light.  Neck:     Musculoskeletal: Normal range of motion and neck supple.     Thyroid: No thyroid mass or thyromegaly.     Vascular: No carotid bruit or JVD.     Trachea: Phonation normal.  Cardiovascular:     Rate and Rhythm: Normal rate and regular rhythm.  Pulmonary:     Effort: Pulmonary effort is normal. No respiratory distress.     Breath sounds:  Normal breath sounds.  Abdominal:     General: Bowel sounds are normal.     Palpations: Abdomen is soft.     Tenderness: There is no abdominal tenderness.     Hernia: A hernia (soft nontender umbilical hernia) is present.  Musculoskeletal: Normal range of motion.  Lymphadenopathy:     Cervical: No cervical adenopathy.  Skin:    General: Skin is warm and dry.  Neurological:     Mental Status: He is alert and oriented to person, place, and time.  Psychiatric:        Behavior: Behavior normal.        Thought Content: Thought content normal.        Judgment: Judgment normal.    BP 123/74    Pulse 82   Temp (!) 97.3 F (36.3 C) (Oral)   Ht 5' 8" (1.727 m)   Wt 283 lb (128.4 kg)   BMI 43.03 kg/m        Assessment & Plan:  Evan Smith comes in today with chief complaint of Medical Management of Chronic Issues   Diagnosis and orders addressed:  1. Essential hypertension Low sodium diet - CMP14+EGFR  2. Diabetes mellitus without complication (HCC) Cab counting encouraged - Bayer DCA Hb A1c Waived - metFORMIN (GLUCOPHAGE-XR) 500 MG 24 hr tablet; TAKE 1 TABLET WITH BREAKFAST  Dispense: 90 tablet; Refill: 1  3. Neuropathy - gabapentin (NEURONTIN) 300 MG capsule; TAKE ONE CAPSULE BY MOUTH ONCE DAILY AT BEDTIME  Dispense: 90 capsule; Refill: 5  4. Mixed hyperlipidemia Low fat diet - Lipid panel - simvastatin (ZOCOR) 40 MG tablet; Take 1 tablet (40 mg total) by mouth at bedtime.  Dispense: 90 tablet; Refill: 1  5. Severe obesity (BMI >= 40) (HCC) Discussed diet and exercise for person with BMI >25 Will recheck weight in 3-6 months  6. Essential hypertension, benign Dash diet - amLODipine (NORVASC) 5 MG tablet; Take 1 tablet (5 mg total) by mouth daily.  Dispense: 90 tablet; Refill: 1 - lisinopril-hydrochlorothiazide (PRINZIDE,ZESTORETIC) 20-25 MG tablet; Take 1 tablet by mouth daily.  Dispense: 90 tablet; Refill: 1  7. Gastroesophageal reflux disease with esophagitis Avoid spicy foods Do not eat 2 hours prior to bedtime - omeprazole (PRILOSEC) 40 MG capsule; TAKE 1 CAPSULE IN THE MORNING  Dispense: 90 capsule; Refill: 1  8. Idiopathic sleep related nonobstructive alveolar hypoventilation - DME Other see comment  9. Erectile dysfunction due to arterial insufficiency - sildenafil (REVATIO) 20 MG tablet; 1-2 tablets daily prn  Dispense: 30 tablet; Refill: 5   Labs pending Health Maintenance reviewed Diet and exercise encouraged  Follow up plan: 3 months   Mary-Margaret Hassell Done, FNP

## 2018-03-06 LAB — CMP14+EGFR
A/G RATIO: 1.3 (ref 1.2–2.2)
ALT: 22 IU/L (ref 0–44)
AST: 23 IU/L (ref 0–40)
Albumin: 4 g/dL (ref 3.8–4.8)
Alkaline Phosphatase: 64 IU/L (ref 39–117)
BUN/Creatinine Ratio: 18 (ref 10–24)
BUN: 20 mg/dL (ref 8–27)
Bilirubin Total: 0.3 mg/dL (ref 0.0–1.2)
CALCIUM: 9.5 mg/dL (ref 8.6–10.2)
CO2: 21 mmol/L (ref 20–29)
CREATININE: 1.1 mg/dL (ref 0.76–1.27)
Chloride: 98 mmol/L (ref 96–106)
GFR, EST AFRICAN AMERICAN: 82 mL/min/{1.73_m2} (ref >59)
GFR, EST NON AFRICAN AMERICAN: 71 mL/min/{1.73_m2} (ref >59)
Globulin, Total: 3.1 g/dL (ref 1.5–4.5)
Glucose: 69 mg/dL (ref 65–99)
POTASSIUM: 5.1 mmol/L (ref 3.5–5.2)
Sodium: 140 mmol/L (ref 134–144)
TOTAL PROTEIN: 7.1 g/dL (ref 6.0–8.5)

## 2018-03-06 LAB — LIPID PANEL
CHOL/HDL RATIO: 2.3 ratio (ref 0.0–5.0)
Cholesterol, Total: 148 mg/dL (ref 100–199)
HDL: 64 mg/dL (ref >39)
LDL Calculated: 39 mg/dL (ref 0–99)
Triglycerides: 226 mg/dL — ABNORMAL HIGH (ref 0–149)
VLDL Cholesterol Cal: 45 mg/dL — ABNORMAL HIGH (ref 5–40)

## 2018-03-31 ENCOUNTER — Other Ambulatory Visit: Payer: Self-pay | Admitting: Nurse Practitioner

## 2018-03-31 DIAGNOSIS — M25512 Pain in left shoulder: Secondary | ICD-10-CM

## 2018-06-08 ENCOUNTER — Encounter: Payer: Self-pay | Admitting: Nurse Practitioner

## 2018-06-08 ENCOUNTER — Ambulatory Visit (INDEPENDENT_AMBULATORY_CARE_PROVIDER_SITE_OTHER): Payer: Medicare HMO | Admitting: Nurse Practitioner

## 2018-06-08 ENCOUNTER — Other Ambulatory Visit: Payer: Self-pay

## 2018-06-08 DIAGNOSIS — G4733 Obstructive sleep apnea (adult) (pediatric): Secondary | ICD-10-CM

## 2018-06-08 DIAGNOSIS — G629 Polyneuropathy, unspecified: Secondary | ICD-10-CM

## 2018-06-08 DIAGNOSIS — N5201 Erectile dysfunction due to arterial insufficiency: Secondary | ICD-10-CM | POA: Diagnosis not present

## 2018-06-08 DIAGNOSIS — K2100 Gastro-esophageal reflux disease with esophagitis, without bleeding: Secondary | ICD-10-CM

## 2018-06-08 DIAGNOSIS — E782 Mixed hyperlipidemia: Secondary | ICD-10-CM

## 2018-06-08 DIAGNOSIS — E119 Type 2 diabetes mellitus without complications: Secondary | ICD-10-CM

## 2018-06-08 DIAGNOSIS — I10 Essential (primary) hypertension: Secondary | ICD-10-CM

## 2018-06-08 MED ORDER — SILDENAFIL CITRATE 20 MG PO TABS: ORAL_TABLET | ORAL | 5 refills | Status: DC

## 2018-06-08 MED ORDER — GABAPENTIN 300 MG PO CAPS: ORAL_CAPSULE | ORAL | 5 refills | Status: DC

## 2018-06-08 MED ORDER — METFORMIN HCL ER 500 MG PO TB24: ORAL_TABLET | ORAL | 1 refills | Status: DC

## 2018-06-08 MED ORDER — AMLODIPINE BESYLATE 5 MG PO TABS: 5.0000 mg | ORAL_TABLET | Freq: Every day | ORAL | 1 refills | Status: DC

## 2018-06-08 MED ORDER — LISINOPRIL-HYDROCHLOROTHIAZIDE 20-25 MG PO TABS: 1.0000 | ORAL_TABLET | Freq: Every day | ORAL | 1 refills | Status: DC

## 2018-06-08 MED ORDER — SIMVASTATIN 40 MG PO TABS: 40.0000 mg | ORAL_TABLET | Freq: Every day | ORAL | 1 refills | Status: DC

## 2018-06-08 MED ORDER — OMEPRAZOLE 40 MG PO CPDR: DELAYED_RELEASE_CAPSULE | ORAL | 1 refills | Status: DC

## 2018-06-08 NOTE — Progress Notes (Signed)
Patient ID: Evan Smith, male   DOB: 10/10/1955, 63 y.o.   MRN: [id number]    Virtual Visit via telephone Note  I connected with Evan Smith on 06/08/18 at 2:40 PMhome by telephone and verified that I am speaking with the correct person using two identifiers. Evan Smith is currently located at home and no one is currently with her during visit. The provider, Mary-Margaret Daphine DeutscherMartin, FNP is located in their office at time of visit.  I discussed the limitations, risks, security and privacy concerns of performing an evaluation and management service by telephone and the availability of in person appointments. I also discussed with the patient that there may be a patient responsible charge related to this service. The patient expressed understanding and agreed to proceed.   History and Present Illness:   Chief Complaint: Medical Management of Chronic Issues    HPI:  1. Essential hypertension No chest pain, sob or headache. Does not check blood pressure at home. BP Readings from Last 3 Encounters:  03/05/18 123/74  12/02/17 116/76  08/21/17 113/69     2. Mixed hyperlipidemia Does not watch diet and does no exercise  3. Diabetes mellitus without complication (HCC) Last HGBA1c was 5.5. he does not check blood sugars very often. He denies any symptoms of symptoms of hypoglycemia  4. Neuropathy Has burning and tingling in bil feet. He takes neurontin nightly which helps.  5. Obstructive sleep apnea syndrome Wears CPAP nightly  6. Severe obesity (BMI >= 40) (HCC) No recent weight changes    Outpatient Encounter Medications as of 06/08/2018  Medication Sig  . amLODipine (NORVASC) 5 MG tablet Take 1 tablet (5 mg total) by mouth daily.  Marland Kitchen. aspirin 81 MG tablet Take 81 mg by mouth daily.  . Cholecalciferol (VITAMIN D3) 1000 UNITS CAPS Take 1 capsule by mouth daily.   . diclofenac sodium (VOLTAREN) 1 % GEL Apply 4 g topically 4 (four) times daily.  . fluticasone (FLONASE)  50 MCG/ACT nasal spray Place 2 sprays into both nostrils daily.  Marland Kitchen. gabapentin (NEURONTIN) 300 MG capsule TAKE ONE CAPSULE BY MOUTH ONCE DAILY AT BEDTIME  . ibuprofen (ADVIL,MOTRIN) 200 MG tablet Take 200 mg by mouth 2 (two) times daily as needed.  Marland Kitchen. lisinopril-hydrochlorothiazide (PRINZIDE,ZESTORETIC) 20-25 MG tablet Take 1 tablet by mouth daily.  Marland Kitchen. loratadine (CLARITIN) 10 MG tablet Take 1 tablet (10 mg total) by mouth daily.  . metFORMIN (GLUCOPHAGE-XR) 500 MG 24 hr tablet TAKE 1 TABLET WITH BREAKFAST  . naproxen (NAPROSYN) 500 MG tablet TAKE 1 TABLET TWICE DAILY WITH MEALS  . omeprazole (PRILOSEC) 40 MG capsule TAKE 1 CAPSULE IN THE MORNING  . Potassium Gluconate 80 MG TABS Take 99 mg by mouth.  . sildenafil (REVATIO) 20 MG tablet 1-2 tablets daily prn  . simvastatin (ZOCOR) 40 MG tablet Take 1 tablet (40 mg total) by mouth at bedtime.     New complaints: None today  Social history: Lives  With his nephew.       Review of Systems  Constitutional: Negative for diaphoresis and weight loss.  Eyes: Negative for blurred vision, double vision and pain.  Respiratory: Negative for shortness of breath.   Cardiovascular: Negative for chest pain, palpitations, orthopnea and leg swelling.  Gastrointestinal: Negative for abdominal pain.  Skin: Negative for rash.  Neurological: Negative for dizziness, sensory change, loss of consciousness, weakness and headaches.  Endo/Heme/Allergies: Negative for polydipsia. Does not bruise/bleed easily.  Psychiatric/Behavioral: Negative for memory loss. The patient does not  have insomnia.   All other systems reviewed and are negative.    Observations/Objective: Alert and oriented- answers all questions appropriately No distress noted  Assessment and Plan: Evan Smith comes in today with chief complaint of Medical Management of Chronic Issues   Diagnosis and orders addressed:  1. Essential hypertension - amLODipine (NORVASC) 5 MG tablet;  Take 1 tablet (5 mg total) by mouth daily.  Dispense: 90 tablet; Refill: 1 - lisinopril-hydrochlorothiazide (ZESTORETIC) 20-25 MG tablet; Take 1 tablet by mouth daily.  Dispense: 90 tablet; Refill: 1   2. Mixed hyperlipidemia Low fat diet - simvastatin (ZOCOR) 40 MG tablet; Take 1 tablet (40 mg total) by mouth at bedtime.  Dispense: 90 tablet; Refill: 1  3. Diabetes mellitus without complication (HCC) Continue to watch carbs in diet - metFORMIN (GLUCOPHAGE-XR) 500 MG 24 hr tablet; TAKE 1 TABLET WITH BREAKFAST  Dispense: 90 tablet; Refill: 1  4. Neuropathy Do not go barefooted - gabapentin (NEURONTIN) 300 MG capsule; TAKE ONE CAPSULE BY MOUTH ONCE DAILY AT BEDTIME  Dispense: 90 capsule; Refill: 5  5. Obstructive sleep apnea syndrome Continue to wear CPC nightly  6. Severe obesity (BMI >= 40) (HCC) Discussed diet and exercise for person with BMI >25 Will recheck weight in 3-6 months  7. Gastroesophageal reflux disease with esophagitis Avoid spicy foods Do not eat 2 hours prior to bedtime - omeprazole (PRILOSEC) 40 MG capsule; TAKE 1 CAPSULE IN THE MORNING  Dispense: 90 capsule; Refill: 1  8. Erectile dysfunction due to arterial insufficiency - sildenafil (REVATIO) 20 MG tablet; 1-2 tablets daily prn  Dispense: 30 tablet; Refill: 5   Previous labs reviewed Health Maintenance reviewed Diet and exercise encouraged    Follow Up Instructions: 3 months     I discussed the assessment and treatment plan with the patient. The patient was  provided an opportunity to ask questions and all were answered. The patient agreed with the plan and demonstrated an understanding of the instructions.   The patient was advised to call back or seek an in-person evaluation if the symptoms worsen or if the condition fails to improve as anticipated.  The above assessment and management plan was discussed with the patient. The patient verbalized understanding of and has agreed to the management plan.  Patient is aware to call the clinic if symptoms persist or worsen. Patient is aware when to return to the clinic for a follow-up visit. Patient educated on when it is appropriate to go to the emergency department.    I provided 15 minutes of non-face-to-face time during this encounter.    Mary-Margaret Daphine Deutscher, FNP

## 2018-06-08 NOTE — Addendum Note (Signed)
Addended by: Bennie Pierini on: 06/08/2018 03:12 PM   Modules accepted: Orders

## 2018-08-10 ENCOUNTER — Other Ambulatory Visit: Payer: Self-pay | Admitting: Nurse Practitioner

## 2018-08-10 DIAGNOSIS — M25512 Pain in left shoulder: Secondary | ICD-10-CM

## 2018-08-17 ENCOUNTER — Telehealth: Payer: Self-pay | Admitting: Nurse Practitioner

## 2018-08-18 ENCOUNTER — Other Ambulatory Visit: Payer: Self-pay

## 2018-08-18 ENCOUNTER — Ambulatory Visit (INDEPENDENT_AMBULATORY_CARE_PROVIDER_SITE_OTHER): Payer: Medicare HMO | Admitting: *Deleted

## 2018-08-18 VITALS — Ht 68.0 in | Wt 283.0 lb

## 2018-08-18 DIAGNOSIS — Z0000 Encounter for general adult medical examination without abnormal findings: Secondary | ICD-10-CM

## 2018-08-18 NOTE — Progress Notes (Addendum)
MEDICARE ANNUAL WELLNESS VISIT  08/18/2018  Telephone Visit Disclaimer This Medicare AWV was conducted by telephone due to national recommendations for restrictions regarding the COVID-19 Pandemic (e.g. social distancing).  I verified, using two identifiers, that I am speaking with Evan CalkinPayton W Turgeon or their authorized healthcare agent. I discussed the limitations, risks, security, and privacy concerns of performing an evaluation and management service by telephone and the potential availability of an in-person appointment in the future. The patient expressed understanding and agreed to proceed.   Subjective:  Evan Smith is a 63 y.o. male patient of Bennie PieriniMartin, Mary-Margaret, FNP who had a Medicare Annual Wellness Visit today via telephone. Evan Smith is Disabled and lives with their family. he has 2 children. he reports that he is socially active and does interact with friends/family regularly. he is minimally physically active and enjoys mechanics.  Patient Care Team: Bennie PieriniMartin, Mary-Margaret, FNP as PCP - General (Family Medicine) Darnell LevelGerkin, Todd, MD as Consulting Physician (General Surgery) West BaliFields, Sandi L, MD as Consulting Physician (Gastroenterology) Dalbert Mayotteohen, Bruce E, MD (Orthopedic Surgery) Shirlean KellyNudelman, Robert, MD as Consulting Physician (Neurosurgery) Hilda LiasBotero, Ernesto, MD as Attending Physician (Neurosurgery)  Advanced Directives 08/18/2018 03/27/2017 10/14/2016 03/21/2016 03/09/2015 08/03/2012 02/14/2012  Does Patient Have a Medical Advance Directive? No No No No No Patient does not have advance directive;Patient would like information Patient does not have advance directive;Patient would not like information  Would patient like information on creating a medical advance directive? Yes (MAU/Ambulatory/Procedural Areas - Information given) Yes (MAU/Ambulatory/Procedural Areas - Information given) - Yes (MAU/Ambulatory/Procedural Areas - Information given) Yes - Educational materials given Advance directive  packet given -  Pre-existing out of facility DNR order (yellow form or pink MOST form) - - - - - No No    Hospital Utilization Over the Past 12 Months: # of hospitalizations or ER visits: 0 # of surgeries: 0  Review of Systems    Patient reports that his overall health is unchanged compared to last year.  Patient Reported Readings (BP, Pulse, CBG, Weight, etc) none  Review of Systems: History obtained from chart review and the patient General ROS: negative  All other systems negative.  Pain Assessment Pain : 0-10 Pain Score: 4  Pain Location: Back Pain Orientation: Lower Pain Descriptors / Indicators: Dull Pain Onset: More than a month ago Pain Frequency: Constant     Current Medications & Allergies (verified) Allergies as of 08/18/2018      Reactions   Hydrocodeine [dihydrocodeine] Nausea And Vomiting      Medication List       Accurate as of August 18, 2018  2:56 PM. If you have any questions, ask your nurse or doctor.        amLODipine 5 MG tablet Commonly known as: NORVASC Take 1 tablet (5 mg total) by mouth daily.   aspirin 81 MG tablet Take 81 mg by mouth daily.   diclofenac sodium 1 % Gel Commonly known as: VOLTAREN Apply 4 g topically 4 (four) times daily.   fluticasone 50 MCG/ACT nasal spray Commonly known as: FLONASE Place 2 sprays into both nostrils daily.   gabapentin 300 MG capsule Commonly known as: NEURONTIN TAKE ONE CAPSULE BY MOUTH ONCE DAILY AT BEDTIME   ibuprofen 200 MG tablet Commonly known as: ADVIL Take 200 mg by mouth 2 (two) times daily as needed.   lisinopril-hydrochlorothiazide 20-25 MG tablet Commonly known as: ZESTORETIC Take 1 tablet by mouth daily.   loratadine 10 MG tablet Commonly known as: CLARITIN Take  1 tablet (10 mg total) by mouth daily.   metFORMIN 500 MG 24 hr tablet Commonly known as: GLUCOPHAGE-XR TAKE 1 TABLET WITH BREAKFAST   naproxen 500 MG tablet Commonly known as: NAPROSYN TAKE 1 TABLET TWICE  DAILY WITH MEALS   omeprazole 40 MG capsule Commonly known as: PRILOSEC TAKE 1 CAPSULE IN THE MORNING   Potassium Gluconate 80 MG Tabs Take 99 mg by mouth.   sildenafil 20 MG tablet Commonly known as: REVATIO 1-2 tablets daily prn   simvastatin 40 MG tablet Commonly known as: ZOCOR Take 1 tablet (40 mg total) by mouth at bedtime.   Vitamin D3 25 MCG (1000 UT) Caps Take 1 capsule by mouth daily.       History (reviewed): Past Medical History:  Diagnosis Date  . Arthritis   . Cataract   . DDD (degenerative disc disease)   . Diabetes mellitus without complication (HCC)    borderline  . GERD (gastroesophageal reflux disease)   . Hyperlipidemia   . Hypertension   . OSA on CPAP    MODERATE OSA PER STUDY 2010  . Perianal fistula   . PONV (postoperative nausea and vomiting)    and hear to wake   Past Surgical History:  Procedure Laterality Date  . CERVICAL FUSION  1990's  . EVALUATION UNDER ANESTHESIA WITH ANAL FISTULECTOMY N/A 08/03/2012   Procedure: EXAM UNDER ANESTHESIA WITH ANAL FISTULECTOMY ;  Surgeon: Leighton Ruff, MD;  Location: [facility];  Service: General;  Laterality: N/A;  . INCISION AND DRAINAGE PERIRECTAL ABSCESS  02/13/2012   Procedure: IRRIGATION AND DEBRIDEMENT PERIRECTAL ABSCESS;  Surgeon: Earnstine Regal, MD;  Location: [facility];  Service: General;  Laterality: N/A;  . LUMBAR FUSION  2010   has had 2 low back surgeries   . RIGHT HYDROCELECTOMY  07-28-2001  . SHOULDER ARTHROSCOPY WITH ROTATOR CUFF REPAIR Left 15's   Family History  Problem Relation Age of Onset  . Brain cancer Mother        tumor  . Diabetes Mother   . Kidney Stones Father   . Diabetes Sister   . Heart disease Sister   . GI problems Sister   . Cancer Brother        prostate  . Cancer Brother        lymphoma   . Heart disease Brother   . Blindness Son   . Arthritis Son        trouble with legs  . Heart disease Paternal Uncle   . Arthritis Son        shoulder  and back    Social History   Socioeconomic History  . Marital status: Legally Separated    Spouse name: Not on file  . Number of children: 2  . Years of education: Not on file  . Highest education level: 11th grade  Occupational History  . Occupation: Disabled    Comment: [employer] - disability   Social Needs  . Financial resource strain: Not hard at all  . Food insecurity    Worry: Never true    Inability: Never true  . Transportation needs    Medical: No    Non-medical: No  Tobacco Use  . Smoking status: Former Smoker    Packs/day: 0.50    Years: 25.00    Pack years: 12.50    Types: Cigarettes    Quit date: 02/12/2004    Years since quitting: 14.5  . Smokeless tobacco: Never Used  Substance  and Sexual Activity  . Alcohol use: Yes    Comment: occassional  . Drug use: No  . Sexual activity: Yes  Lifestyle  . Physical activity    Days per week: 2 days    Minutes per session: 30 min  . Stress: Not at all  Relationships  . Social connections    Talks on phone: More than three times a week    Gets together: Once a week    Attends religious service: More than 4 times per year    Active member of club or organization: No    Attends meetings of clubs or organizations: Never    Relationship status: Separated  Other Topics Concern  . Not on file  Social History Narrative  . Not on file    Activities of Daily Living In your present state of health, do you have any difficulty performing the following activities: 08/18/2018  Hearing? N  Vision? N  Difficulty concentrating or making decisions? N  Walking or climbing stairs? N  Dressing or bathing? N  Doing errands, shopping? N  Preparing Food and eating ? N  Using the Toilet? N  In the past six months, have you accidently leaked urine? N  Do you have problems with loss of bowel control? N  Managing your Medications? N  Managing your Finances? N  Housekeeping or managing your Housekeeping? N  Some recent  data might be hidden    Patient Literacy How often do you need to have someone help you when you read instructions, pamphlets, or other written materials from your doctor or pharmacy?: 1 - Never What is the last grade level you completed in school?: 11th Grade  Exercise Current Exercise Habits: The patient does not participate in regular exercise at present, Exercise limited by: None identified  Diet Patient reports consuming 2 meals a day and 2 snack(s) a day Patient reports that his primary diet is: Regular Patient reports that she does have regular access to food.   Depression Screen PHQ 2/9 Scores 08/18/2018 03/05/2018 12/02/2017 08/21/2017 04/08/2017 03/27/2017 03/27/2017  PHQ - 2 Score 0 0 0 0 0 0 0     Fall Risk Fall Risk  08/18/2018 03/05/2018 12/02/2017 08/21/2017 04/08/2017  Falls in the past year? 0 0 No No No  Number falls in past yr: 0 - - - -  Injury with Fall? 0 - - - -     Objective:  Evan Smith seemed alert and oriented and he participated appropriately during our telephone visit.  Blood Pressure Weight BMI  BP Readings from Last 3 Encounters:  03/05/18 123/74  12/02/17 116/76  08/21/17 113/69   Wt Readings from Last 3 Encounters:  08/18/18 283 lb (128.4 kg)  03/05/18 283 lb (128.4 kg)  12/02/17 283 lb (128.4 kg)   BMI Readings from Last 1 Encounters:  08/18/18 43.03 kg/m    *Unable to obtain current vital signs, weight, and BMI due to telephone visit type  Hearing/Vision  . Normal did not seem to have difficulty with hearing/understanding during the telephone conversation . Reports that he has had a formal eye exam by an eye care professional within the past year . Reports that he has not had a formal hearing evaluation within the past year *Unable to fully assess hearing and vision during telephone visit type  Cognitive Function: 6CIT Screen 08/18/2018  What Year? 0 points  What month? 0 points  What time? 0 points  Count back from 20 0 points  Months in reverse 0 points  Repeat phrase 0 points  Total Score 0   (Normal:0-7, Significant for Dysfunction: >8)  Normal Cognitive Function Screening: Yes   Immunization & Health Maintenance Record Immunization History  Administered Date(s) Administered  . Influenza,inj,Quad PF,6+ Mos 11/24/2013, 01/13/2015, 11/24/2015, 11/26/2016, 12/02/2017  . Pneumococcal Conjugate-13 12/02/2017    Health Maintenance  Topic Date Due  . TETANUS/TDAP  05/26/2016  . COLONOSCOPY  12/03/2018 (Originally 03/21/2016)  . HEMOGLOBIN A1C  09/03/2018  . INFLUENZA VACCINE  09/12/2018  . OPHTHALMOLOGY EXAM  01/14/2019  . FOOT EXAM  06/08/2019  . Hepatitis C Screening  Completed  . HIV Screening  Completed       Assessment  This is a routine wellness examination for Evan Smith.  Health Maintenance: Due or Overdue Health Maintenance Due  Topic Date Due  . TETANUS/TDAP  05/26/2016    Evan Smith does not need a referral for Community Assistance: Care Management:   no Social Work:    no Prescription Assistance:  no Nutrition/Diabetes Education:  no   Plan:  Personalized Goals Goals Addressed   None    Personalized Health Maintenance & Screening Recommendations  Td vaccine Colorectal cancer screening Advanced directives: has NO advanced directive  - add't info requested. Referral to SW: no  Lung Cancer Screening Recommended: no (Low Dose CT Chest recommended if Age 72-80 years, 30 pack-year currently smoking OR have quit w/in past 15 years) Hepatitis C Screening recommended: no HIV Screening recommended: no  Advanced Directives: Written information was prepared per patient's request.  Referrals & Orders No orders of the defined types were placed in this encounter.   Follow-up Plan . Follow-up with Bennie PieriniMartin, Mary-Margaret, FNP as planned  I have personally reviewed and noted the following in the patient's chart:   . Medical and social history . Use of alcohol, tobacco  or illicit drugs  . Current medications and supplements . Functional ability and status . Nutritional status . Physical activity . Advanced directives . List of other physicians . Hospitalizations, surgeries, and ER visits in previous 12 months . Vitals . Screenings to include cognitive, depression, and falls . Referrals and appointments  In addition, I have reviewed and discussed with Evan Smith certain preventive protocols, quality metrics, and best practice recommendations. A written personalized care plan for preventive services as well as general preventive health recommendations is available and can be mailed to the patient at his request.      Caryl BisChanda M Luie Laneve, LPN  9/6/29527/08/2018  I have reviewed and agree with the above AWV documentation.   Mary-Margaret Daphine DeutscherMartin, FNP

## 2018-08-18 NOTE — Patient Instructions (Signed)
[name] , Thank you for taking time to come for your Medicare Wellness Visit. I appreciate your ongoing commitment to your health goals. Please review the following plan we discussed and let me know if I can assist you in the future.   These are the goals we discussed: Goals    . Increase physical activity     Start walking  Get active     . Prevent falls       This is a list of the screening recommended for you and due dates:  Health Maintenance  Topic Date Due  . Tetanus Vaccine  05/26/2016  . Colon Cancer Screening  12/03/2018*  . Hemoglobin A1C  09/03/2018  . Flu Shot  09/12/2018  . Eye exam for diabetics  01/14/2019  . Complete foot exam   06/08/2019  .  Hepatitis C: One time screening is recommended by Center for Disease Control  (CDC) for  adults born from [birth year] through 1966.   Completed  . HIV Screening  Completed  *Topic was postponed. The date shown is not the original due date.    Advance Directive  Advance directives are legal documents that let you make choices ahead of time about your health care and medical treatment in case you become unable to communicate for yourself. Advance directives are a way for you to communicate your wishes to family, friends, and health care providers. This can help convey your decisions about end-of-life care if you become unable to communicate. Discussing and writing advance directives should happen over time rather than all at once. Advance directives can be changed depending on your situation and what you want, even after you have signed the advance directives. If you do not have an advance directive, some states assign family decision makers to act on your behalf based on how closely you are related to them. Each state has its own laws regarding advance directives. You may want to check with your health care provider, attorney, or state representative about the laws in your state. There are different types of advance directives, such  as:  Medical power of attorney.  Living will.  Do not resuscitate (DNR) or do not attempt resuscitation (DNAR) order. Health care proxy and medical power of attorney A health care proxy, also called a health care agent, is a person who is appointed to make medical decisions for you in cases in which you are unable to make the decisions yourself. Generally, people choose someone they know well and trust to represent their preferences. Make sure to ask this person for an agreement to act as your proxy. A proxy may have to exercise judgment in the event of a medical decision for which your wishes are not known. A medical power of attorney is a legal document that names your health care proxy. Depending on the laws in your state, after the document is written, it may also need to be:  Signed.  Notarized.  Dated.  Copied.  Witnessed.  Incorporated into your medical record. You may also want to appoint someone to manage your financial affairs in a situation in which you are unable to do so. This is called a durable power of attorney for finances. It is a separate legal document from the durable power of attorney for health care. You may choose the same person or someone different from your health care proxy to act as your agent in financial matters. If you do not appoint a proxy, or if there is a  concern that the proxy is not acting in your best interests, a court-appointed guardian may be designated to act on your behalf. Living will A living will is a set of instructions documenting your wishes about medical care when you cannot express them yourself. Health care providers should keep a copy of your living will in your medical record. You may want to give a copy to family members or friends. To alert caregivers in case of an emergency, you can place a card in your wallet to let them know that you have a living will and where they can find it. A living will is used if you become:  Terminally  ill.  Incapacitated.  Unable to communicate or make decisions. Items to consider in your living will include:  The use or non-use of life-sustaining equipment, such as dialysis machines and breathing machines (ventilators).  A DNR or DNAR order, which is the instruction not to use cardiopulmonary resuscitation (CPR) if breathing or heartbeat stops.  The use or non-use of tube feeding.  Withholding of food and fluids.  Comfort (palliative) care when the goal becomes comfort rather than a cure.  Organ and tissue donation. A living will does not give instructions for distributing your money and property if you should pass away. It is recommended that you seek the advice of a lawyer when writing a will. Decisions about taxes, beneficiaries, and asset distribution will be legally binding. This process can relieve your family and friends of any concerns surrounding disputes or questions that may come up about the distribution of your assets. DNR or DNAR A DNR or DNAR order is a request not to have CPR in the event that your heart stops beating or you stop breathing. If a DNR or DNAR order has not been made and shared, a health care provider will try to help any patient whose heart has stopped or who has stopped breathing. If you plan to have surgery, talk with your health care provider about how your DNR or DNAR order will be followed if problems occur. Summary  Advance directives are the legal documents that allow you to make choices ahead of time about your health care and medical treatment in case you become unable to communicate for yourself.  The process of discussing and writing advance directives should happen over time. You can change the advance directives, even after you have signed them.  Advance directives include DNR or DNAR orders, living wills, and designating an agent as your medical power of attorney. This information is not intended to replace advice given to you by your  health care provider. Make sure you discuss any questions you have with your health care provider. Document Released: 05/07/2007 Document Revised: 03/04/2018 Document Reviewed: 12/18/2015 Elsevier Patient Education  [place].   BMI for Adults  Body mass index (BMI) is a number that is calculated from a person's weight and height. BMI may help to estimate how much of a person's weight is composed of fat. BMI can help identify those who may be at higher risk for certain medical problems. How is BMI used with adults? BMI is used as a screening tool to identify possible weight problems. It is used to check whether a person is obese, overweight, healthy weight, or underweight. How is BMI calculated? BMI measures your weight and compares it to your height. This can be done either in [place] ([place] or metric measurements. Note that charts are available to help you find your BMI quickly and  easily without having to do these calculations yourself. To calculate your BMI in [place] ([place] measurements, your health care provider will: 1. Measure your weight in pounds (lb). 2. Multiply the number of pounds by 703. ? For example, for a person who weighs 180 lb, multiply that number by 703, which equals 126,540. 3. Measure your height in inches (in). Then multiply that number by itself to get a measurement called "inches squared." ? For example, for a person who is 70 in tall, the "inches squared" measurement is 70 in x 70 in, which equals 4900 inches squared. 4. Divide the total from Step 2 (number of lb x 703) by the total from Step 3 (inches squared): 126,540  4900 = 25.8. This is your BMI. To calculate your BMI in metric measurements, your health care provider will: 1. Measure your weight in kilograms (kg). 2. Measure your height in meters (m). Then multiply that number by itself to get a measurement called "meters squared." ? For example, for a person who is 1.75 m tall, the "meters squared"  measurement is 1.75 m x 1.75 m, which is equal to 3.1 meters squared. 3. Divide the number of kilograms (your weight) by the meters squared number. In this example: 70  3.1 = 22.6. This is your BMI. How is BMI interpreted? To interpret your results, your health care provider will use BMI charts to identify whether you are underweight, normal weight, overweight, or obese. The following guidelines will be used:  Underweight: BMI less than 18.5.  Normal weight: BMI between 18.5 and 24.9.  Overweight: BMI between 25 and 29.9.  Obese: BMI of 30 and above. Please note:  Weight includes both fat and muscle, so someone with a muscular build, such as an athlete, may have a BMI that is higher than 24.9. In cases like these, BMI is not an accurate measure of body fat.  To determine if excess body fat is the cause of a BMI of 25 or higher, further assessments may need to be done by a health care provider.  BMI is usually interpreted in the same way for men and women. Why is BMI a useful tool? BMI is useful in two ways:  Identifying a weight problem that may be related to a medical condition, or that may increase the risk for medical problems.  Promoting lifestyle and diet changes in order to reach a healthy weight. Summary  Body mass index (BMI) is a number that is calculated from a person's weight and height.  BMI may help to estimate how much of a person's weight is composed of fat. BMI can help identify those who may be at higher risk for certain medical problems.  BMI can be measured using English measurements or metric measurements.  To interpret your results, your health care provider will use BMI charts to identify whether you are underweight, normal weight, overweight, or obese. This information is not intended to replace advice given to you by your health care provider. Make sure you discuss any questions you have with your health care provider. Document Released: 10/10/2003 Document  Revised: 01/10/2017 Document Reviewed: 12/11/2016 Elsevier Patient Education  2020 [employer]

## 2018-08-20 DIAGNOSIS — G4733 Obstructive sleep apnea (adult) (pediatric): Secondary | ICD-10-CM | POA: Diagnosis not present

## 2018-09-03 ENCOUNTER — Telehealth: Payer: Self-pay | Admitting: Nurse Practitioner

## 2018-09-03 NOTE — Chronic Care Management (AMB) (Signed)
Chronic Care Management   Note  09/03/2018 Name: Evan Smith MRN: [id number] DOB: Dec 11, 1955  Evan Smith is a 63 y.o. year old male who is a primary care patient of Chevis Pretty, [place]. I reached out to [name] by phone today in response to a referral sent by Mr. Thatcher Doberstein [facility] health plan.    Mr. Daughtridge was given information about Chronic Care Management services today including:  1. CCM service includes personalized support from designated clinical staff supervised by his physician, including individualized plan of care and coordination with other care providers 2. 24/7 contact phone numbers for assistance for urgent and routine care needs. 3. Service will only be billed when office clinical staff spend 20 minutes or more in a month to coordinate care. 4. Only one practitioner may furnish and bill the service in a calendar month. 5. The patient may stop CCM services at any time (effective at the end of the month) by phone call to the office staff. 6. The patient will be responsible for cost sharing (co-pay) of up to 20% of the service fee (after annual deductible is met).  Patient agreed to services and verbal consent obtained.   Follow up plan: Telephone appointment with CCM team member scheduled for: 09/23/2018  Cobb  ??[email or web address]   ??[phone]

## 2018-09-04 ENCOUNTER — Other Ambulatory Visit: Payer: Self-pay | Admitting: Nurse Practitioner

## 2018-09-07 ENCOUNTER — Other Ambulatory Visit: Payer: Self-pay

## 2018-09-08 ENCOUNTER — Other Ambulatory Visit: Payer: Self-pay

## 2018-09-08 ENCOUNTER — Ambulatory Visit (INDEPENDENT_AMBULATORY_CARE_PROVIDER_SITE_OTHER): Payer: Medicare HMO | Admitting: Nurse Practitioner

## 2018-09-08 ENCOUNTER — Encounter: Payer: Self-pay | Admitting: Nurse Practitioner

## 2018-09-08 VITALS — BP 114/72 | HR 70 | Temp 99.6°F | Ht 68.0 in | Wt 284.0 lb

## 2018-09-08 DIAGNOSIS — G629 Polyneuropathy, unspecified: Secondary | ICD-10-CM

## 2018-09-08 DIAGNOSIS — E782 Mixed hyperlipidemia: Secondary | ICD-10-CM

## 2018-09-08 DIAGNOSIS — K2100 Gastro-esophageal reflux disease with esophagitis, without bleeding: Secondary | ICD-10-CM

## 2018-09-08 DIAGNOSIS — G4733 Obstructive sleep apnea (adult) (pediatric): Secondary | ICD-10-CM

## 2018-09-08 DIAGNOSIS — E119 Type 2 diabetes mellitus without complications: Secondary | ICD-10-CM | POA: Diagnosis not present

## 2018-09-08 DIAGNOSIS — N5201 Erectile dysfunction due to arterial insufficiency: Secondary | ICD-10-CM

## 2018-09-08 DIAGNOSIS — J309 Allergic rhinitis, unspecified: Secondary | ICD-10-CM

## 2018-09-08 DIAGNOSIS — I10 Essential (primary) hypertension: Secondary | ICD-10-CM

## 2018-09-08 LAB — BAYER DCA HB A1C WAIVED: HB A1C (BAYER DCA - WAIVED): 5.3 % (ref <7.0)

## 2018-09-08 MED ORDER — AMLODIPINE BESYLATE 5 MG PO TABS: 5.0000 mg | ORAL_TABLET | Freq: Every day | ORAL | 1 refills | Status: DC

## 2018-09-08 MED ORDER — OMEPRAZOLE 40 MG PO CPDR: DELAYED_RELEASE_CAPSULE | ORAL | 1 refills | Status: DC

## 2018-09-08 MED ORDER — SIMVASTATIN 40 MG PO TABS: 40.0000 mg | ORAL_TABLET | Freq: Every day | ORAL | 1 refills | Status: DC

## 2018-09-08 MED ORDER — SILDENAFIL CITRATE 20 MG PO TABS: ORAL_TABLET | ORAL | 5 refills | Status: DC

## 2018-09-08 MED ORDER — GABAPENTIN 300 MG PO CAPS: ORAL_CAPSULE | ORAL | 5 refills | Status: DC

## 2018-09-08 MED ORDER — METFORMIN HCL ER 500 MG PO TB24: ORAL_TABLET | ORAL | 1 refills | Status: DC

## 2018-09-08 MED ORDER — LORATADINE 10 MG PO TABS: 10.0000 mg | ORAL_TABLET | Freq: Every day | ORAL | 11 refills | Status: DC

## 2018-09-08 MED ORDER — LISINOPRIL-HYDROCHLOROTHIAZIDE 20-25 MG PO TABS: 1.0000 | ORAL_TABLET | Freq: Every day | ORAL | 1 refills | Status: DC

## 2018-09-08 NOTE — Addendum Note (Signed)
Addended by: Chevis Pretty on: 09/08/2018 10:51 AM   Modules accepted: Orders

## 2018-09-08 NOTE — Progress Notes (Signed)
Subjective:    Patient ID: Evan Smith, male    DOB: [birth year], 63 y.o.   MRN: [id number]   Chief Complaint: medical management of chronic issues   HPI:  1. Essential hypertension No c/o chest pain, sob or headache. Doesnot check blood pressure at home.  BP Readings from Last 3 Encounters:  03/05/18 123/74  12/02/17 116/76  08/21/17 113/69     2. Mixed hyperlipidemia Does not watch diet and does no exercise. He does take zocor daily  3. Diabetes mellitus without complication ([place]) Last OHYW7P was 5.5. He ususally does not check blood sugars at home.  4. Neuropathy Has burning of bil feet and is currently on neurontin daily  5. Obstructive sleep apnea syndrome Wears CPAP nightly  6. Severe obesity (BMI >= 40) (HCC) No recent weight changes    Outpatient Encounter Medications as of 09/08/2018  Medication Sig  . amLODipine (NORVASC) 5 MG tablet Take 1 tablet (5 mg total) by mouth daily.  Marland Kitchen aspirin 81 MG tablet Take 81 mg by mouth daily.  . Cholecalciferol (VITAMIN D3) 1000 UNITS CAPS Take 1 capsule by mouth daily.   . diclofenac sodium (VOLTAREN) 1 % GEL Apply 4 g topically 4 (four) times daily.  . fluticasone (FLONASE) 50 MCG/ACT nasal spray USE 2 SPRAYS IN EACH NOSTRIL EVERY DAY  . gabapentin (NEURONTIN) 300 MG capsule TAKE ONE CAPSULE BY MOUTH ONCE DAILY AT BEDTIME  . ibuprofen (ADVIL,MOTRIN) 200 MG tablet Take 200 mg by mouth 2 (two) times daily as needed.  Marland Kitchen lisinopril-hydrochlorothiazide (ZESTORETIC) 20-25 MG tablet Take 1 tablet by mouth daily.  Marland Kitchen loratadine (CLARITIN) 10 MG tablet Take 1 tablet (10 mg total) by mouth daily.  . metFORMIN (GLUCOPHAGE-XR) 500 MG 24 hr tablet TAKE 1 TABLET WITH BREAKFAST  . naproxen (NAPROSYN) 500 MG tablet TAKE 1 TABLET TWICE DAILY WITH MEALS  . omeprazole (PRILOSEC) 40 MG capsule TAKE 1 CAPSULE IN THE MORNING  . Potassium Gluconate 80 MG TABS Take 99 mg by mouth.  . sildenafil (REVATIO) 20 MG tablet 1-2 tablets daily prn  .  simvastatin (ZOCOR) 40 MG tablet Take 1 tablet (40 mg total) by mouth at bedtime.    Past Surgical History:  Procedure Laterality Date  . CERVICAL FUSION  1990's  . EVALUATION UNDER ANESTHESIA WITH ANAL FISTULECTOMY N/A 08/03/2012   Procedure: EXAM UNDER ANESTHESIA WITH ANAL FISTULECTOMY ;  Surgeon: Leighton Ruff, MD;  Location: [facility];  Service: General;  Laterality: N/A;  . INCISION AND DRAINAGE PERIRECTAL ABSCESS  02/13/2012   Procedure: IRRIGATION AND DEBRIDEMENT PERIRECTAL ABSCESS;  Surgeon: Earnstine Regal, MD;  Location: [facility];  Service: General;  Laterality: N/A;  . LUMBAR FUSION  2010   has had 2 low back surgeries   . RIGHT HYDROCELECTOMY  07-28-2001  . SHOULDER ARTHROSCOPY WITH ROTATOR CUFF REPAIR Left 84's    Family History  Problem Relation Age of Onset  . Brain cancer Mother        tumor  . Diabetes Mother   . Kidney Stones Father   . Diabetes Sister   . Heart disease Sister   . GI problems Sister   . Cancer Brother        prostate  . Cancer Brother        lymphoma   . Heart disease Brother   . Blindness Son   . Arthritis Son        trouble with legs  . Heart disease Paternal Uncle   .  Arthritis Son        shoulder and back     New complaints: None today  Social history: Lives with his nephew whom is not home very often  Controlled substance contract: N/A    Review of Systems  Constitutional: Negative for activity change and appetite change.  HENT: Negative.   Eyes: Negative for pain.  Respiratory: Negative for shortness of breath.   Cardiovascular: Negative for chest pain, palpitations and leg swelling.  Gastrointestinal: Negative for abdominal pain.  Endocrine: Negative for polydipsia.  Genitourinary: Negative.   Skin: Negative for rash.  Neurological: Negative for dizziness, weakness and headaches.  Hematological: Does not bruise/bleed easily.  Psychiatric/Behavioral: Negative.   All other systems reviewed and are  negative.      Objective:   Physical Exam Vitals signs and nursing note reviewed.  Constitutional:      Appearance: Normal appearance. He is well-developed.  HENT:     Head: Normocephalic.     Nose: Nose normal.  Eyes:     Pupils: Pupils are equal, round, and reactive to light.  Neck:     Musculoskeletal: Normal range of motion and neck supple.     Thyroid: No thyroid mass or thyromegaly.     Vascular: No carotid bruit or JVD.     Trachea: Phonation normal.  Cardiovascular:     Rate and Rhythm: Normal rate and regular rhythm.  Pulmonary:     Effort: Pulmonary effort is normal. No respiratory distress.     Breath sounds: Normal breath sounds.  Abdominal:     General: Bowel sounds are normal.     Palpations: Abdomen is soft.     Tenderness: There is no abdominal tenderness.  Musculoskeletal: Normal range of motion.  Lymphadenopathy:     Cervical: No cervical adenopathy.  Skin:    General: Skin is warm and dry.     Comments: Callus formation bil big toe  Neurological:     Mental Status: He is alert and oriented to person, place, and time.  Psychiatric:        Behavior: Behavior normal.        Thought Content: Thought content normal.        Judgment: Judgment normal.    BP 114/72   Pulse 70   Temp 99.6 F (37.6 C) (Temporal)   Ht '5\' 8"'  (1.727 m)   Wt 284 lb (128.8 kg)   BMI 43.18 kg/m   hgba1c 5.3      Assessment & Plan:  Evan Smith comes in today with chief complaint of Medical Management of Chronic Issues   Diagnosis and orders addressed:  1. Essential hypertension Low sodium diet - CMP14+EGFR  2. Mixed hyperlipidemia Low fta diet - Lipid panel  3. Diabetes mellitus without complication ([place]) Continue to watch carbs in diet - Bayer DCA Hb A1c Waived - Microalbumin / creatinine urine ratio  4. Neuropathy Do not go barefooted  5. Obstructive sleep apnea syndrome Continue to wear CPAP nightly  6. Severe obesity (BMI >= 40) (HCC)  Discussed diet and exercise for person with BMI >25 Will recheck weight in 3-6 months  7. Erectile dysfunction due to arterial insufficiency - sildenafil (REVATIO) 20 MG tablet; 1-2 tablets daily prn  Dispense: 30 tablet; Refill: 5   Labs pending Health Maintenance reviewed Diet and exercise encouraged  Follow up plan: 3 months   Mary-Margaret Hassell Done, FNP

## 2018-09-08 NOTE — Patient Instructions (Signed)
Diabetes Mellitus and Foot Care Foot care is an important part of your health, especially when you have diabetes. Diabetes may cause you to have problems because of poor blood flow (circulation) to your feet and legs, which can cause your skin to:  Become thinner and drier.  Break more easily.  Heal more slowly.  Peel and crack. You may also have nerve damage (neuropathy) in your legs and feet, causing decreased feeling in them. This means that you may not notice minor injuries to your feet that could lead to more serious problems. Noticing and addressing any potential problems early is the best way to prevent future foot problems. How to care for your feet Foot hygiene  Wash your feet daily with warm water and mild soap. Do not use hot water. Then, pat your feet and the areas between your toes until they are completely dry. Do not soak your feet as this can dry your skin.  Trim your toenails straight across. Do not dig under them or around the cuticle. File the edges of your nails with an emery board or nail file.  Apply a moisturizing lotion or petroleum jelly to the skin on your feet and to dry, brittle toenails. Use lotion that does not contain alcohol and is unscented. Do not apply lotion between your toes. Shoes and socks  Wear clean socks or stockings every day. Make sure they are not too tight. Do not wear knee-high stockings since they may decrease blood flow to your legs.  Wear shoes that fit properly and have enough cushioning. Always look in your shoes before you put them on to be sure there are no objects inside.  To break in new shoes, wear them for just a few hours a day. This prevents injuries on your feet. Wounds, scrapes, corns, and calluses  Check your feet daily for blisters, cuts, bruises, sores, and redness. If you cannot see the bottom of your feet, use a mirror or ask someone for help.  Do not cut corns or calluses or try to remove them with medicine.  If you  find a minor scrape, cut, or break in the skin on your feet, keep it and the skin around it clean and dry. You may clean these areas with mild soap and water. Do not clean the area with peroxide, alcohol, or iodine.  If you have a wound, scrape, corn, or callus on your foot, look at it several times a day to make sure it is healing and not infected. Check for: ? Redness, swelling, or pain. ? Fluid or blood. ? Warmth. ? Pus or a bad smell. General instructions  Do not cross your legs. This may decrease blood flow to your feet.  Do not use heating pads or hot water bottles on your feet. They may burn your skin. If you have lost feeling in your feet or legs, you may not know this is happening until it is too late.  Protect your feet from hot and cold by wearing shoes, such as at the beach or on hot pavement.  Schedule a complete foot exam at least once a year (annually) or more often if you have foot problems. If you have foot problems, report any cuts, sores, or bruises to your health care provider immediately. Contact a health care provider if:  You have a medical condition that increases your risk of infection and you have any cuts, sores, or bruises on your feet.  You have an injury that is not   healing.  You have redness on your legs or feet.  You feel burning or tingling in your legs or feet.  You have pain or cramps in your legs and feet.  Your legs or feet are numb.  Your feet always feel cold.  You have pain around a toenail. Get help right away if:  You have a wound, scrape, corn, or callus on your foot and: ? You have pain, swelling, or redness that gets worse. ? You have fluid or blood coming from the wound, scrape, corn, or callus. ? Your wound, scrape, corn, or callus feels warm to the touch. ? You have pus or a bad smell coming from the wound, scrape, corn, or callus. ? You have a fever. ? You have a red line going up your leg. Summary  Check your feet every day  for cuts, sores, red spots, swelling, and blisters.  Moisturize feet and legs daily.  Wear shoes that fit properly and have enough cushioning.  If you have foot problems, report any cuts, sores, or bruises to your health care provider immediately.  Schedule a complete foot exam at least once a year (annually) or more often if you have foot problems. This information is not intended to replace advice given to you by your health care provider. Make sure you discuss any questions you have with your health care provider. Document Released: 01/26/2000 Document Revised: 03/12/2017 Document Reviewed: 03/01/2016 Elsevier Patient Education  2020 Elsevier Inc.  

## 2018-09-09 LAB — CMP14+EGFR
ALT: 19 IU/L (ref 0–44)
AST: 20 IU/L (ref 0–40)
Albumin/Globulin Ratio: 2 (ref 1.2–2.2)
Albumin: 4.3 g/dL (ref 3.8–4.8)
Alkaline Phosphatase: 56 IU/L (ref 39–117)
BUN/Creatinine Ratio: 13 (ref 10–24)
BUN: 20 mg/dL (ref 8–27)
Bilirubin Total: 0.3 mg/dL (ref 0.0–1.2)
CO2: 23 mmol/L (ref 20–29)
Calcium: 9.4 mg/dL (ref 8.6–10.2)
Chloride: 98 mmol/L (ref 96–106)
Creatinine, Ser: 1.55 mg/dL — ABNORMAL HIGH (ref 0.76–1.27)
GFR calc Af Amer: 54 mL/min/{1.73_m2} — ABNORMAL LOW (ref >59)
GFR calc non Af Amer: 47 mL/min/{1.73_m2} — ABNORMAL LOW (ref >59)
Globulin, Total: 2.2 g/dL (ref 1.5–4.5)
Glucose: 97 mg/dL (ref 65–99)
Potassium: 4.7 mmol/L (ref 3.5–5.2)
Sodium: 138 mmol/L (ref 134–144)
Total Protein: 6.5 g/dL (ref 6.0–8.5)

## 2018-09-09 LAB — MICROALBUMIN / CREATININE URINE RATIO
Creatinine, Urine: 134.9 mg/dL
Microalb/Creat Ratio: 4 mg/g creat (ref 0–29)
Microalbumin, Urine: 5.5 ug/mL

## 2018-09-09 LAB — LIPID PANEL
Chol/HDL Ratio: 2.4 ratio (ref 0.0–5.0)
Cholesterol, Total: 127 mg/dL (ref 100–199)
HDL: 53 mg/dL (ref >39)
LDL Calculated: 57 mg/dL (ref 0–99)
Triglycerides: 86 mg/dL (ref 0–149)
VLDL Cholesterol Cal: 17 mg/dL (ref 5–40)

## 2018-09-23 ENCOUNTER — Ambulatory Visit (INDEPENDENT_AMBULATORY_CARE_PROVIDER_SITE_OTHER): Payer: Medicare HMO | Admitting: *Deleted

## 2018-09-23 DIAGNOSIS — E119 Type 2 diabetes mellitus without complications: Secondary | ICD-10-CM | POA: Diagnosis not present

## 2018-09-23 DIAGNOSIS — I10 Essential (primary) hypertension: Secondary | ICD-10-CM | POA: Diagnosis not present

## 2018-09-23 DIAGNOSIS — G629 Polyneuropathy, unspecified: Secondary | ICD-10-CM

## 2018-09-23 NOTE — Patient Instructions (Signed)
Visit Information  Goals Addressed            This Visit's Progress     Patient Stated   . "I need some help with my feet" (pt-stated)       Current Barriers:  . Chronic Disease Management support and education needs related to diabetic foot care  Nurse Case Manager Clinical Goal(s):  Marland Kitchen Over the next 30 days, patient will work with [facility] and PCP to address needs related to diabetic foot care . Over the next 30 days, patient will attend all scheduled medical appointments: Scheduled appt for Chevis Pretty,  [place] 10/12/18 at 9:00  Interventions:  . Advised patient to continue checking feet daily looking for any new calluses, blisters, breaks in the skin, or other changes in the skin and/or toenails . Discussed plans with patient for ongoing care management follow up and provided patient with direct contact information for care management team . Reviewed scheduled/upcoming provider appointments including:  . Discussed diabetic foot care . Discussed HPI o Saw Dr Irving Shows several years ago o Has thickened and discolored toenails o Dry rough skin on bottom of feet o No cracks in the skin o ? Ingrown toenail  Patient Self Care Activities:  . Performs ADL's independently . Performs IADL's independently  Initial goal documentation     . "I woule like to monitor my blood pressure at home" (pt-stated)       Current Barriers:  . Chronic Disease Management support and education needs related to hypertension  Nurse Case Manager Clinical Goal(s):  Marland Kitchen Over the next 30 days, patient will obtain home blood pressure monitor  Interventions:  . Care Guide referral for home blood pressure monitor acquisition  Patient Self Care Activities:  . Performs ADL's independently . Performs IADL's independently  Initial goal documentation     . "I'd like to manage my pain better" (pt-stated)       Current Barriers:  . Chronic Disease Management support and education needs related to  neuropathy  Nurse Case Manager Clinical Goal(s):  Marland Kitchen Over the next 30 days, patient will work with [facility] to address needs related to neuropathy pain management  Interventions:  . Evaluation of current treatment plan related to neuropathy and patient's adherence to plan as established by provider. . Reviewed medications with patient and discussed Gabapentin 364m . Reviewed scheduled/upcoming provider appointments including: MShelah Lewandowsky8/31/20 . Discussed HPI o Has been taking gabapentin 3043mfor many years. Doesn't think it is helping very much o Takes Tylenol OTC PRN.   . Marland KitchenNCM planned collaboration with PCP regarding potential increase in gabapentin . Continue to avoid NSAIDs due to elevated kidney functions and DM  Patient Self Care Activities:  . Performs ADL's independently . Performs IADL's independently  Initial goal documentation      Mr. BaMarteas given information about Chronic Care Management services today including:  1. CCM service includes personalized support from designated clinical staff supervised by his physician, including individualized plan of care and coordination with other care providers 2. 24/7 contact phone numbers for assistance for urgent and routine care needs. 3. Service will only be billed when office clinical staff spend 20 minutes or more in a month to coordinate care. 4. Only one practitioner may furnish and bill the service in a calendar month. 5. The patient may stop CCM services at any time (effective at the end of the month) by phone call to the office staff. 6. The patient will be responsible for  cost sharing (co-pay) of up to 20% of the service fee (after annual deductible is met).  Patient agreed to services and verbal consent obtained.    Plan:  The care management team will reach out to the patient again over the next 30 days.   Referral to Care Guide to research home BP monitors Appt scheduled with PCP for 10/12/18 to assess and treat  feet as needed Talk with PCP about potentially increasing gabapentin at 10/12/18 visit  Chong Sicilian BSN, RN-BC [place] [facility] [place] Management Direct Dial: [phone]

## 2018-09-23 NOTE — Chronic Care Management (AMB) (Addendum)
Chronic Care Management   Initial Visit Note  09/23/2018 Name: Evan Smith MRN: [id number] DOB: May 15, 1955  Referred by: Chevis Pretty, FNP Reason for referral : Chronic Care Management (RN Initial Visit)   Evan Smith is a 63 y.o. year old male who is a primary care patient of Chevis Pretty, [place]. The CCM team was consulted for assistance with chronic disease management and care coordination needs.   Review of patient status, including review of consultants reports, relevant laboratory and other test results, and collaboration with appropriate care team members and the patient's provider was performed as part of comprehensive patient evaluation and provision of chronic care management services.    SDOH (Social Determinants of Health) screening performed today. See Care Plan Entry related to challenges with: None  Subjective: "I need some help with my feet and toenails." I spoke with [name] by telephone today regarding his chronic medical conditions and how the CCM team can provide assistance to help with self manage these conditions. His primary concerns today are diabetic foot care, neuropathic pain, and hypertension management.   Objective:  Lab Results  Component Value Date   HGBA1C 5.3 09/08/2018   HGBA1C 5.5 03/05/2018   HGBA1C 5.5 12/02/2017   Lab Results  Component Value Date   LDLCALC 57 09/08/2018   CREATININE 1.55 (H) 09/08/2018   BP Readings from Last 3 Encounters:  09/08/18 114/72  03/05/18 123/74  12/02/17 116/76   Medication List amLODipine 5 MG tablet Commonly known as: NORVASC Take 1 tablet (5 mg total) by mouth daily.   aspirin 81 MG tablet Take 81 mg by mouth daily.   fluticasone 50 MCG/ACT nasal spray Commonly known as: FLONASE USE 2 SPRAYS IN EACH NOSTRIL EVERY DAY   gabapentin 300 MG capsule Commonly known as: NEURONTIN TAKE ONE CAPSULE BY MOUTH ONCE DAILY AT BEDTIME Can't tell that it is helping. Would like to  consider dose increase  ibuprofen 200 MG tablet Commonly known as: ADVIL Take 200 mg by mouth 2 (two) times daily as needed. Not taking and I advised him not to due to DM and elevated kidney functions  lisinopril-hydrochlorothiazide 20-25 MG tablet Commonly known as: ZESTORETIC Take 1 tablet by mouth daily.   loratadine 10 MG tablet Commonly known as: CLARITIN Take 1 tablet (10 mg total) by mouth daily.   metFORMIN 500 MG 24 hr tablet Commonly known as: GLUCOPHAGE-XR TAKE 1 TABLET WITH BREAKFAST   naproxen 500 MG tablet Commonly known as: NAPROSYN TAKE 1 TABLET TWICE DAILY WITH MEALS Not taking and I advised him not to due to DM and elevated kidney functions  omeprazole 40 MG capsule Commonly known as: PRILOSEC TAKE 1 CAPSULE IN THE MORNING   Potassium Gluconate 80 MG Tabs Take 99 mg by mouth.   sildenafil 20 MG tablet Commonly known as: REVATIO 1-2 tablets daily prn   simvastatin 40 MG tablet Commonly known as: ZOCOR Take 1 tablet (40 mg total) by mouth at bedtime.   Vitamin D3 25 MCG (1000 UT) Caps Take 1 capsule by mouth daily.       Assessment: Goals Addressed            This Visit's Progress     Patient Stated   . "I need some help with my feet" (pt-stated)       Current Barriers:  . Chronic Disease Management support and education needs related to diabetic foot care  Nurse Case Manager Clinical Goal(s):  Marland Kitchen Over the next 30 days,  [facility] to address needs related to diabetic foot care . Over the next 30 days, patient will attend all scheduled medical appointments: Scheduled appt for Bennie PieriniMary Margaret Martin,  FNP 10/12/18 at 9:00  Interventions:  . Advised patient to continue checking feet daily looking for any new calluses, blisters, breaks in the skin, or other changes in the skin and/or toenails . Discussed plans with patient for ongoing care management follow up and provided patient with direct contact information for care  management team . Reviewed scheduled/upcoming provider appointments including:  . Discussed diabetic foot care . Discussed HPI o Saw Dr Ulice Brilliantrake several years ago o Has thickened and discolored toenails o Dry rough skin on bottom of feet o No cracks in the skin o ? Ingrown toenail  Patient Self Care Activities:  . Performs ADL's independently . Performs IADL's independently  Initial goal documentation     . "I woule like to monitor my blood pressure at home" (pt-stated)       Current Barriers:  . Chronic Disease Management support and education needs related to hypertension  Nurse Case Manager Clinical Goal(s):  Marland Kitchen. Over the next 30 days, patient will obtain home blood pressure monitor  Interventions:  . Care Guide referral for home blood pressure monitor acquisition  Patient Self Care Activities:  . Performs ADL's independently . Performs IADL's independently  Initial goal documentation     . "I'd like to manage my pain better" (pt-stated)       Current Barriers:  . Chronic Disease Management support and education needs related to neuropathy  Nurse Case Manager Clinical Goal(s):  Marland Kitchen. Over the next 30 days, [facility] needs related to neuropathy pain management  Interventions:  . Evaluation of current treatment plan related to neuropathy and patient's adherence to plan as established by provider. . Reviewed medications with patient and discussed Gabapentin 300mg  . Reviewed scheduled/upcoming provider appointments including: Gennette PacMary Margaret 10/12/18 . Discussed HPI o Has been taking gabapentin 300mg  for many years. Doesn't think it is helping very much o Takes Tylenol OTC PRN.   Marland Kitchen. RNCM planned collaboration with PCP regarding potential increase in gabapentin . Continue to avoid NSAIDs due to elevated kidney functions and DM  Patient Self Care Activities:  . Performs ADL's independently . Performs IADL's independently  Initial goal documentation          Plan:  The care management team will reach out to the patient again over the next 30 days.   Referral to Care Guide to research home BP monitors Appt scheduled with PCP for 10/12/18 to assess and treat feet as needed Talk with PCP about potentially increasing gabapentin at 10/12/18 visit  Demetrios LollKristen Maizy Davanzo BSN, RN-BC Embedded Chronic Care Manager [facility] Management Direct Dial: [phone]    "I have reviewed this encounter including the documentation in this note and/or discussed this patient with the nurse coordinator, Demetrios Lollkristen Coda Mathey, RN . I am certifying that I agree with the content of this note as supervising physician." Mary-Margaret Daphine DeutscherMartin, FNP

## 2018-09-25 ENCOUNTER — Telehealth: Payer: Self-pay

## 2018-09-25 NOTE — Telephone Encounter (Signed)
09/25/2018 Spoke with patient about [employer] system offered to [employer] members. Ambrose Mantle [phone]

## 2018-09-30 ENCOUNTER — Telehealth: Payer: Self-pay

## 2018-09-30 NOTE — Telephone Encounter (Signed)
09/30/2018 Spoke with patient about medical alert service with [place] carries multiple alert services.  Patient was not eligible for [employer]. He is currently out of town will follow up in a few days. Ambrose Mantle [phone]

## 2018-10-02 ENCOUNTER — Other Ambulatory Visit: Payer: Self-pay | Admitting: Nurse Practitioner

## 2018-10-02 DIAGNOSIS — M25512 Pain in left shoulder: Secondary | ICD-10-CM

## 2018-10-07 ENCOUNTER — Telehealth: Payer: Self-pay

## 2018-10-07 NOTE — Telephone Encounter (Signed)
10/07/2018 Spoke with patient he has not contacted [place] or checked [employer].  He has been out of town and plans to call  next week. Patient requested that I text and email the information to him. Evan Smith [phone]

## 2018-10-12 ENCOUNTER — Ambulatory Visit: Payer: Medicare HMO | Admitting: Nurse Practitioner

## 2018-11-11 ENCOUNTER — Other Ambulatory Visit: Payer: Self-pay

## 2018-11-12 ENCOUNTER — Ambulatory Visit (INDEPENDENT_AMBULATORY_CARE_PROVIDER_SITE_OTHER): Payer: Medicare HMO | Admitting: Nurse Practitioner

## 2018-11-12 ENCOUNTER — Encounter: Payer: Self-pay | Admitting: Nurse Practitioner

## 2018-11-12 VITALS — BP 116/73 | HR 103 | Temp 98.4°F | Resp 20 | Ht 68.0 in | Wt 282.0 lb

## 2018-11-12 DIAGNOSIS — G629 Polyneuropathy, unspecified: Secondary | ICD-10-CM

## 2018-11-12 DIAGNOSIS — I10 Essential (primary) hypertension: Secondary | ICD-10-CM | POA: Diagnosis not present

## 2018-11-12 DIAGNOSIS — Z23 Encounter for immunization: Secondary | ICD-10-CM

## 2018-11-12 DIAGNOSIS — J309 Allergic rhinitis, unspecified: Secondary | ICD-10-CM

## 2018-11-12 DIAGNOSIS — E1142 Type 2 diabetes mellitus with diabetic polyneuropathy: Secondary | ICD-10-CM

## 2018-11-12 DIAGNOSIS — G4733 Obstructive sleep apnea (adult) (pediatric): Secondary | ICD-10-CM | POA: Diagnosis not present

## 2018-11-12 DIAGNOSIS — K2100 Gastro-esophageal reflux disease with esophagitis, without bleeding: Secondary | ICD-10-CM | POA: Diagnosis not present

## 2018-11-12 DIAGNOSIS — E119 Type 2 diabetes mellitus without complications: Secondary | ICD-10-CM

## 2018-11-12 DIAGNOSIS — N5201 Erectile dysfunction due to arterial insufficiency: Secondary | ICD-10-CM

## 2018-11-12 DIAGNOSIS — E782 Mixed hyperlipidemia: Secondary | ICD-10-CM | POA: Diagnosis not present

## 2018-11-12 DIAGNOSIS — Z125 Encounter for screening for malignant neoplasm of prostate: Secondary | ICD-10-CM

## 2018-11-12 LAB — BAYER DCA HB A1C WAIVED: HB A1C (BAYER DCA - WAIVED): 5.9 % (ref <7.0)

## 2018-11-12 MED ORDER — METFORMIN HCL ER 500 MG PO TB24: ORAL_TABLET | ORAL | 1 refills | Status: DC

## 2018-11-12 MED ORDER — LISINOPRIL-HYDROCHLOROTHIAZIDE 20-25 MG PO TABS: 1.0000 | ORAL_TABLET | Freq: Every day | ORAL | 1 refills | Status: DC

## 2018-11-12 MED ORDER — SILDENAFIL CITRATE 20 MG PO TABS: ORAL_TABLET | ORAL | 5 refills | Status: DC

## 2018-11-12 MED ORDER — OMEPRAZOLE 40 MG PO CPDR: DELAYED_RELEASE_CAPSULE | ORAL | 1 refills | Status: DC

## 2018-11-12 MED ORDER — GABAPENTIN 300 MG PO CAPS: ORAL_CAPSULE | ORAL | 1 refills | Status: DC

## 2018-11-12 MED ORDER — LORATADINE 10 MG PO TABS: 10.0000 mg | ORAL_TABLET | Freq: Every day | ORAL | 1 refills | Status: DC

## 2018-11-12 MED ORDER — AMLODIPINE BESYLATE 5 MG PO TABS: 5.0000 mg | ORAL_TABLET | Freq: Every day | ORAL | 1 refills | Status: DC

## 2018-11-12 MED ORDER — SIMVASTATIN 40 MG PO TABS: 40.0000 mg | ORAL_TABLET | Freq: Every day | ORAL | 1 refills | Status: DC

## 2018-11-12 NOTE — Progress Notes (Signed)
Subjective:    Patient ID: Evan Smith, male    DOB: 02/24/1955, 63 y.o.   MRN: [id number]   Chief Complaint: Medical Management of Chronic Issues    HPI:  1. Essential hypertension No c/o chest pain, sob or headache. Does not check blood pressure at home. BP Readings from Last 3 Encounters:  09/08/18 114/72  03/05/18 123/74  12/02/17 116/76     2.  Type 2 diabetes mellitus with diabetic polyneuropathy, without long-term current use of insulin ([place]) He does not check blood sugars at home. Denies any symptoms of low blood sugars. Lab Results  Component Value Date   HGBA1C 5.3 09/08/2018     3. Mixed hyperlipidemia Does not watch diet and does no exercise. He does try to stay active though, is on zocor daily. Lab Results  Component Value Date   CHOL 127 09/08/2018   HDL 53 09/08/2018   LDLCALC 57 09/08/2018   TRIG 86 09/08/2018   CHOLHDL 2.4 09/08/2018     4. Neuropathy Has constant burning in bil feet from diabetes. He is on neurontin that helps. He does not feel that they are getting worse at this time.  5. Obstructive sleep apnea syndrome Wears CPAP nightly. Says that he feels rested in the mornings  6. Severe obesity (BMI >= 40) (HCC) No recent weight chnanges Wt Readings from Last 3 Encounters:  09/08/18 284 lb (128.8 kg)  08/18/18 283 lb (128.4 kg)  03/05/18 283 lb (128.4 kg)   BMI Readings from Last 3 Encounters:  09/08/18 43.18 kg/m  08/18/18 43.03 kg/m  03/05/18 43.03 kg/m     Outpatient Encounter Medications as of 11/12/2018  Medication Sig  . amLODipine (NORVASC) 5 MG tablet Take 1 tablet (5 mg total) by mouth daily.  Marland Kitchen aspirin 81 MG tablet Take 81 mg by mouth daily.  . Cholecalciferol (VITAMIN D3) 1000 UNITS CAPS Take 1 capsule by mouth daily.   . fluticasone (FLONASE) 50 MCG/ACT nasal spray USE 2 SPRAYS IN EACH NOSTRIL EVERY DAY  . gabapentin (NEURONTIN) 300 MG capsule TAKE ONE CAPSULE BY MOUTH ONCE DAILY AT BEDTIME  . ibuprofen  (ADVIL,MOTRIN) 200 MG tablet Take 200 mg by mouth 2 (two) times daily as needed.  Marland Kitchen lisinopril-hydrochlorothiazide (ZESTORETIC) 20-25 MG tablet Take 1 tablet by mouth daily.  Marland Kitchen loratadine (CLARITIN) 10 MG tablet Take 1 tablet (10 mg total) by mouth daily.  . metFORMIN (GLUCOPHAGE-XR) 500 MG 24 hr tablet TAKE 1 TABLET WITH BREAKFAST  . naproxen (NAPROSYN) 500 MG tablet TAKE 1 TABLET TWICE DAILY WITH MEALS  . omeprazole (PRILOSEC) 40 MG capsule TAKE 1 CAPSULE IN THE MORNING  . Potassium Gluconate 80 MG TABS Take 99 mg by mouth.  . sildenafil (REVATIO) 20 MG tablet 1-2 tablets daily prn  . simvastatin (ZOCOR) 40 MG tablet Take 1 tablet (40 mg total) by mouth at bedtime.    Past Surgical History:  Procedure Laterality Date  . CERVICAL FUSION  1990's  . EVALUATION UNDER ANESTHESIA WITH ANAL FISTULECTOMY N/A 08/03/2012   Procedure: EXAM UNDER ANESTHESIA WITH ANAL FISTULECTOMY ;  Surgeon: Leighton Ruff, MD;  Location: [place];  Service: General;  Laterality: N/A;  . INCISION AND DRAINAGE PERIRECTAL ABSCESS  02/13/2012   Procedure: IRRIGATION AND DEBRIDEMENT PERIRECTAL ABSCESS;  Surgeon: Earnstine Regal, MD;  Location: [place];  Service: General;  Laterality: N/A;  . LUMBAR FUSION  2010   has had 2 low back surgeries   . RIGHT HYDROCELECTOMY  07-28-2001  .  SHOULDER ARTHROSCOPY WITH ROTATOR CUFF REPAIR Left 23's    Family History  Problem Relation Age of Onset  . Brain cancer Mother        tumor  . Diabetes Mother   . Kidney Stones Father   . Diabetes Sister   . Heart disease Sister   . GI problems Sister   . Cancer Brother        prostate  . Cancer Brother        lymphoma   . Heart disease Brother   . Blindness Son   . Arthritis Son        trouble with legs  . Heart disease Paternal Uncle   . Arthritis Son        shoulder and back     New complaints: None today  Social history: His nephew lives with him  Controlled substance contract: N/A    Review of  Systems  Constitutional: Negative for activity change and appetite change.  HENT: Negative.   Eyes: Negative for pain.  Respiratory: Negative for shortness of breath.   Cardiovascular: Negative for chest pain, palpitations and leg swelling.  Gastrointestinal: Negative for abdominal pain.  Endocrine: Negative for polydipsia.  Genitourinary: Negative.   Skin: Negative for rash.  Neurological: Negative for dizziness, weakness and headaches.  Hematological: Does not bruise/bleed easily.  Psychiatric/Behavioral: Negative.   All other systems reviewed and are negative.      Objective:   Physical Exam Vitals signs and nursing note reviewed.  Constitutional:      Appearance: Normal appearance. He is well-developed.  HENT:     Head: Normocephalic.     Nose: Nose normal.  Eyes:     Pupils: Pupils are equal, round, and reactive to light.  Neck:     Musculoskeletal: Normal range of motion and neck supple.     Thyroid: No thyroid mass or thyromegaly.     Vascular: No carotid bruit or JVD.     Trachea: Phonation normal.  Cardiovascular:     Rate and Rhythm: Normal rate and regular rhythm.  Pulmonary:     Effort: Pulmonary effort is normal. No respiratory distress.     Breath sounds: Normal breath sounds.  Abdominal:     General: Bowel sounds are normal.     Palpations: Abdomen is soft.     Tenderness: There is no abdominal tenderness.  Musculoskeletal: Normal range of motion.  Lymphadenopathy:     Cervical: No cervical adenopathy.  Skin:    General: Skin is warm and dry.  Neurological:     Mental Status: He is alert and oriented to person, place, and time.  Psychiatric:        Behavior: Behavior normal.        Thought Content: Thought content normal.        Judgment: Judgment normal.     BP 116/73   Pulse (!) 103   Temp 98.4 F (36.9 C) (Oral)   Resp 20   Ht '5\' 8"'  (1.727 m)   Wt 282 lb (127.9 kg)   SpO2 100%   BMI 42.88 kg/m   hgba1c 5.9      Assessment & Plan:   Evan Smith comes in today with chief complaint of Medical Management of Chronic Issues   Diagnosis and orders addressed:  1. Essential hypertension Low sodium diet - CMP14+EGFR - amLODipine (NORVASC) 5 MG tablet; Take 1 tablet (5 mg total) by mouth daily.  Dispense: 90 tablet; Refill: 1 - lisinopril-hydrochlorothiazide (ZESTORETIC)  20-25 MG tablet; Take 1 tablet by mouth daily.  Dispense: 90 tablet; Refill: 1  2. Mixed hyperlipidemia Low fat diet - Lipid panel - simvastatin (ZOCOR) 40 MG tablet; Take 1 tablet (40 mg total) by mouth at bedtime.  Dispense: 90 tablet; Refill: 1  3. Type 2 diabetes mellitus with diabetic polyneuropathy, without long-term current use of insulin (HCC) Continue to watch carbs in diet - Bayer DCA Hb A1c Waived - metFORMIN (GLUCOPHAGE-XR) 500 MG 24 hr tablet; TAKE 1 TABLET WITH BREAKFAST  Dispense: 90 tablet; Refill: 1   4. Neuropathy Do not go barefooted - gabapentin (NEURONTIN) 300 MG capsule; TAKE ONE CAPSULE BY MOUTH ONCE DAILY AT BEDTIME  Dispense: 270 capsule; Refill: 1  5. Obstructive sleep apnea syndrome Continue to wear cpap at night  6. Severe obesity (BMI >= 40) (HCC) Discussed diet and exercise for person with BMI >25 Will recheck weight in 3-6 months   8. Gastroesophageal reflux disease with esophagitis Avoid spicy foods Do not eat 2 hours prior to bedtime - omeprazole (PRILOSEC) 40 MG capsule; TAKE 1 CAPSULE IN THE MORNING  Dispense: 90 capsule; Refill: 1  9. Erectile dysfunction due to arterial insufficiency - sildenafil (REVATIO) 20 MG tablet; 1-2 tablets daily prn  Dispense: 30 tablet; Refill: 5  10. Prostate cancer screening - PSA, total and free   Labs pending Health Maintenance reviewed Diet and exercise encouraged  Follow up plan: 6 months   [place], FNP

## 2018-11-12 NOTE — Patient Instructions (Signed)
Diabetes Mellitus and Foot Care Foot care is an important part of your health, especially when you have diabetes. Diabetes may cause you to have problems because of poor blood flow (circulation) to your feet and legs, which can cause your skin to:  Become thinner and drier.  Break more easily.  Heal more slowly.  Peel and crack. You may also have nerve damage (neuropathy) in your legs and feet, causing decreased feeling in them. This means that you may not notice minor injuries to your feet that could lead to more serious problems. Noticing and addressing any potential problems early is the best way to prevent future foot problems. How to care for your feet Foot hygiene  Wash your feet daily with warm water and mild soap. Do not use hot water. Then, pat your feet and the areas between your toes until they are completely dry. Do not soak your feet as this can dry your skin.  Trim your toenails straight across. Do not dig under them or around the cuticle. File the edges of your nails with an emery board or nail file.  Apply a moisturizing lotion or petroleum jelly to the skin on your feet and to dry, brittle toenails. Use lotion that does not contain alcohol and is unscented. Do not apply lotion between your toes. Shoes and socks  Wear clean socks or stockings every day. Make sure they are not too tight. Do not wear knee-high stockings since they may decrease blood flow to your legs.  Wear shoes that fit properly and have enough cushioning. Always look in your shoes before you put them on to be sure there are no objects inside.  To break in new shoes, wear them for just a few hours a day. This prevents injuries on your feet. Wounds, scrapes, corns, and calluses  Check your feet daily for blisters, cuts, bruises, sores, and redness. If you cannot see the bottom of your feet, use a mirror or ask someone for help.  Do not cut corns or calluses or try to remove them with medicine.  If you  find a minor scrape, cut, or break in the skin on your feet, keep it and the skin around it clean and dry. You may clean these areas with mild soap and water. Do not clean the area with peroxide, alcohol, or iodine.  If you have a wound, scrape, corn, or callus on your foot, look at it several times a day to make sure it is healing and not infected. Check for: ? Redness, swelling, or pain. ? Fluid or blood. ? Warmth. ? Pus or a bad smell. General instructions  Do not cross your legs. This may decrease blood flow to your feet.  Do not use heating pads or hot water bottles on your feet. They may burn your skin. If you have lost feeling in your feet or legs, you may not know this is happening until it is too late.  Protect your feet from hot and cold by wearing shoes, such as at the beach or on hot pavement.  Schedule a complete foot exam at least once a year (annually) or more often if you have foot problems. If you have foot problems, report any cuts, sores, or bruises to your health care provider immediately. Contact a health care provider if:  You have a medical condition that increases your risk of infection and you have any cuts, sores, or bruises on your feet.  You have an injury that is not   healing.  You have redness on your legs or feet.  You feel burning or tingling in your legs or feet.  You have pain or cramps in your legs and feet.  Your legs or feet are numb.  Your feet always feel cold.  You have pain around a toenail. Get help right away if:  You have a wound, scrape, corn, or callus on your foot and: ? You have pain, swelling, or redness that gets worse. ? You have fluid or blood coming from the wound, scrape, corn, or callus. ? Your wound, scrape, corn, or callus feels warm to the touch. ? You have pus or a bad smell coming from the wound, scrape, corn, or callus. ? You have a fever. ? You have a red line going up your leg. Summary  Check your feet every day  for cuts, sores, red spots, swelling, and blisters.  Moisturize feet and legs daily.  Wear shoes that fit properly and have enough cushioning.  If you have foot problems, report any cuts, sores, or bruises to your health care provider immediately.  Schedule a complete foot exam at least once a year (annually) or more often if you have foot problems. This information is not intended to replace advice given to you by your health care provider. Make sure you discuss any questions you have with your health care provider. Document Released: 01/26/2000 Document Revised: 03/12/2017 Document Reviewed: 03/01/2016 Elsevier Patient Education  2020 Elsevier Inc.  

## 2018-11-13 LAB — CMP14+EGFR
ALT: 19 IU/L (ref 0–44)
AST: 26 IU/L (ref 0–40)
Albumin/Globulin Ratio: 1.6 (ref 1.2–2.2)
Albumin: 4.8 g/dL (ref 3.8–4.8)
Alkaline Phosphatase: 66 IU/L (ref 39–117)
BUN/Creatinine Ratio: 18 (ref 10–24)
BUN: 26 mg/dL (ref 8–27)
Bilirubin Total: 0.4 mg/dL (ref 0.0–1.2)
CO2: 17 mmol/L — ABNORMAL LOW (ref 20–29)
Calcium: 9.5 mg/dL (ref 8.6–10.2)
Chloride: 101 mmol/L (ref 96–106)
Creatinine, Ser: 1.44 mg/dL — ABNORMAL HIGH (ref 0.76–1.27)
GFR calc Af Amer: 59 mL/min/{1.73_m2} — ABNORMAL LOW (ref >59)
GFR calc non Af Amer: 51 mL/min/{1.73_m2} — ABNORMAL LOW (ref >59)
Globulin, Total: 3 g/dL (ref 1.5–4.5)
Glucose: 88 mg/dL (ref 65–99)
Potassium: 4.6 mmol/L (ref 3.5–5.2)
Sodium: 140 mmol/L (ref 134–144)
Total Protein: 7.8 g/dL (ref 6.0–8.5)

## 2018-11-13 LAB — LIPID PANEL
Chol/HDL Ratio: 2.8 ratio (ref 0.0–5.0)
Cholesterol, Total: 167 mg/dL (ref 100–199)
HDL: 60 mg/dL (ref >39)
LDL Chol Calc (NIH): 87 mg/dL (ref 0–99)
Triglycerides: 114 mg/dL (ref 0–149)
VLDL Cholesterol Cal: 20 mg/dL (ref 5–40)

## 2018-11-13 LAB — SPECIMEN STATUS REPORT

## 2018-11-24 ENCOUNTER — Other Ambulatory Visit: Payer: Self-pay

## 2018-11-24 DIAGNOSIS — Z20828 Contact with and (suspected) exposure to other viral communicable diseases: Secondary | ICD-10-CM | POA: Diagnosis not present

## 2018-11-24 DIAGNOSIS — Z20822 Contact with and (suspected) exposure to covid-19: Secondary | ICD-10-CM

## 2018-11-26 LAB — NOVEL CORONAVIRUS, NAA: SARS-CoV-2, NAA: NOT DETECTED

## 2018-12-11 ENCOUNTER — Ambulatory Visit: Payer: Self-pay | Admitting: Nurse Practitioner

## 2018-12-16 ENCOUNTER — Other Ambulatory Visit: Payer: Self-pay | Admitting: Nurse Practitioner

## 2018-12-16 DIAGNOSIS — M25512 Pain in left shoulder: Secondary | ICD-10-CM

## 2019-01-12 ENCOUNTER — Ambulatory Visit (INDEPENDENT_AMBULATORY_CARE_PROVIDER_SITE_OTHER): Payer: Medicare HMO | Admitting: Family

## 2019-01-12 ENCOUNTER — Encounter: Payer: Self-pay | Admitting: Family

## 2019-01-12 DIAGNOSIS — J019 Acute sinusitis, unspecified: Secondary | ICD-10-CM | POA: Diagnosis not present

## 2019-01-12 MED ORDER — AMOXICILLIN-POT CLAVULANATE 875-125 MG PO TABS: 1.0000 | ORAL_TABLET | Freq: Two times a day (BID) | ORAL | 0 refills | Status: DC

## 2019-01-12 NOTE — Progress Notes (Signed)
   Virtual Visit via telephone Note Due to COVID-19 pandemic this visit was conducted virtually. This visit type was conducted due to national recommendations for restrictions regarding the COVID-19 Pandemic (e.g. social distancing, sheltering in place) in an effort to limit this patient's exposure and mitigate transmission in our community. All issues noted in this document were discussed and addressed.  A physical exam was not performed with this format.  I connected with Evan Smith on 01/12/19 at 11:24 AM by telephone and verified that I am speaking with the correct person using two identifiers. Evan Smith is currently located at car and no one is currently with him during visit. The provider, Evelina Dun, FNP is located in their office at time of visit.  I discussed the limitations, risks, security and privacy concerns of performing an evaluation and management service by telephone and the availability of in person appointments. I also discussed with the patient that there may be a patient responsible charge related to this service. The patient expressed understanding and agreed to proceed.   History and Present Illness:  Sinusitis This is a new problem. The current episode started 1 to 4 weeks ago. The problem has been gradually worsening since onset. There has been no fever. His pain is at a severity of 7/10. The pain is mild. Associated symptoms include chills, congestion, coughing, ear pain, headaches and sneezing. Pertinent negatives include no hoarse voice, shortness of breath, sinus pressure or sore throat. Past treatments include acetaminophen. The treatment provided mild relief.      Review of Systems  Constitutional: Positive for chills.  HENT: Positive for congestion, ear pain and sneezing. Negative for hoarse voice, sinus pressure and sore throat.   Respiratory: Positive for cough. Negative for shortness of breath.   Neurological: Positive for headaches.  All other  systems reviewed and are negative.    Observations/Objective: No SOB or distress noted  Assessment and Plan: 1. Acute sinusitis, recurrence not specified, unspecified location - Take meds as prescribed - Use a cool mist humidifier  -Use saline nose sprays frequently -Force fluids -For any cough or congestion  Use plain Mucinex- regular strength or max strength is fine -For fever or aces or pains- take tylenol or ibuprofen. -Throat lozenges if help -Call office if symptoms worsen or do not improve  - amoxicillin-clavulanate (AUGMENTIN) 875-125 MG tablet; Take 1 tablet by mouth 2 (two) times daily.  Dispense: 14 tablet; Refill: 0     I discussed the assessment and treatment plan with the patient. The patient was provided an opportunity to ask questions and all were answered. The patient agreed with the plan and demonstrated an understanding of the instructions.   The patient was advised to call back or seek an in-person evaluation if the symptoms worsen or if the condition fails to improve as anticipated.  The above assessment and management plan was discussed with the patient. The patient verbalized understanding of and has agreed to the management plan. Patient is aware to call the clinic if symptoms persist or worsen. Patient is aware when to return to the clinic for a follow-up visit. Patient educated on when it is appropriate to go to the emergency department.   Time call ended:  11:33 AM  I provided 9 minutes of non-face-to-face time during this encounter.    Evelina Dun, FNP

## 2019-01-13 ENCOUNTER — Other Ambulatory Visit: Payer: Self-pay | Admitting: Nurse Practitioner

## 2019-01-20 ENCOUNTER — Other Ambulatory Visit: Payer: Self-pay

## 2019-01-20 ENCOUNTER — Other Ambulatory Visit: Payer: Self-pay | Admitting: Nurse Practitioner

## 2019-01-20 ENCOUNTER — Ambulatory Visit (INDEPENDENT_AMBULATORY_CARE_PROVIDER_SITE_OTHER): Payer: Medicare HMO | Admitting: Family Medicine

## 2019-01-20 DIAGNOSIS — J3489 Other specified disorders of nose and nasal sinuses: Secondary | ICD-10-CM

## 2019-01-20 DIAGNOSIS — J019 Acute sinusitis, unspecified: Secondary | ICD-10-CM

## 2019-01-20 MED ORDER — AMOXICILLIN-POT CLAVULANATE 875-125 MG PO TABS: 1.0000 | ORAL_TABLET | Freq: Two times a day (BID) | ORAL | 0 refills | Status: AC

## 2019-01-20 MED ORDER — AZELASTINE HCL 0.1 % NA SOLN: 1.0000 | Freq: Two times a day (BID) | NASAL | 12 refills | Status: DC

## 2019-01-20 NOTE — Patient Instructions (Signed)

## 2019-01-20 NOTE — Telephone Encounter (Signed)
Pt just finished antibiotic on Monday and requesting a refill. Advised he would ntbs for televisit to discuss and scheduled today at 5.

## 2019-01-20 NOTE — Progress Notes (Signed)
Telephone visit  Subjective: CC: sinus infection PCP: Chevis Pretty, FNP [id number] Evan Smith is a 63 y.o. male calls for telephone consult today. Patient provides verbal consent for consult held via phone.  Location of patient: home Location of provider: Working remotely from home Others present for call: none  1. Sinus infection Patient with ongoing sinus symptoms.  He reports copious drainage, sneezing and watery eyes.  He reports compliance with Flonase and Claritin.  The Augmentin did help some but has not totally resolved symptoms.  He would like to continue course of possible.  Does not report any fevers, chest pain, shortness of breath.  Drainage seems to be the main issue.   ROS: Per HPI  Allergies  Allergen Reactions  . Hydrocodeine [Dihydrocodeine] Nausea And Vomiting   Past Medical History:  Diagnosis Date  . Arthritis   . Cataract   . DDD (degenerative disc disease)   . Diabetes mellitus without complication (HCC)    borderline  . GERD (gastroesophageal reflux disease)   . Hyperlipidemia   . Hypertension   . OSA on CPAP    MODERATE OSA PER STUDY 2010  . Perianal fistula   . PONV (postoperative nausea and vomiting)    and hear to wake    Current Outpatient Medications:  .  amLODipine (NORVASC) 5 MG tablet, Take 1 tablet (5 mg total) by mouth daily., Disp: 90 tablet, Rfl: 1 .  amoxicillin-clavulanate (AUGMENTIN) 875-125 MG tablet, Take 1 tablet by mouth 2 (two) times daily., Disp: 14 tablet, Rfl: 0 .  aspirin 81 MG tablet, Take 81 mg by mouth daily., Disp: , Rfl:  .  Cholecalciferol (VITAMIN D3) 1000 UNITS CAPS, Take 1 capsule by mouth daily. , Disp: , Rfl:  .  fluticasone (FLONASE) 50 MCG/ACT nasal spray, USE 2 SPRAYS IN EACH NOSTRIL EVERY DAY, Disp: 48 g, Rfl: 1 .  gabapentin (NEURONTIN) 300 MG capsule, TAKE ONE CAPSULE BY MOUTH ONCE DAILY AT BEDTIME, Disp: 270 capsule, Rfl: 1 .  ibuprofen (ADVIL,MOTRIN) 200 MG tablet, Take 200 mg by mouth 2 (two)  times daily as needed., Disp: , Rfl:  .  lisinopril-hydrochlorothiazide (ZESTORETIC) 20-25 MG tablet, Take 1 tablet by mouth daily., Disp: 90 tablet, Rfl: 1 .  loratadine (CLARITIN) 10 MG tablet, Take 1 tablet (10 mg total) by mouth daily., Disp: 90 tablet, Rfl: 1 .  metFORMIN (GLUCOPHAGE-XR) 500 MG 24 hr tablet, TAKE 1 TABLET WITH BREAKFAST, Disp: 90 tablet, Rfl: 1 .  naproxen (NAPROSYN) 500 MG tablet, TAKE 1 TABLET TWICE DAILY WITH MEALS, Disp: 180 tablet, Rfl: 0 .  omeprazole (PRILOSEC) 40 MG capsule, TAKE 1 CAPSULE IN THE MORNING, Disp: 90 capsule, Rfl: 1 .  Potassium Gluconate 80 MG TABS, Take 99 mg by mouth., Disp: , Rfl:  .  sildenafil (REVATIO) 20 MG tablet, 1-2 tablets daily prn, Disp: 30 tablet, Rfl: 5 .  simvastatin (ZOCOR) 40 MG tablet, Take 1 tablet (40 mg total) by mouth at bedtime., Disp: 90 tablet, Rfl: 1  Assessment/ Plan: 63 y.o. male   1. Acute sinusitis, recurrence not specified, unspecified location Renewal of Augmentin sent. - amoxicillin-clavulanate (AUGMENTIN) 875-125 MG tablet; Take 1 tablet by mouth 2 (two) times daily for 7 days.  Dispense: 14 tablet; Refill: 0  2. Rhinorrhea Astelin added.  Hold Flonase.  Continue Claritin.  Return precautions discussed.  Follow-up as needed. - azelastine (ASTELIN) 0.1 % nasal spray; Place 1 spray into both nostrils 2 (two) times daily. (for drainage)  Dispense: 30 mL; Refill: 12  Start time: 3:53pm End time: 3:58pm  Total time spent on patient care (including telephone call/ virtual visit): 12 minutes  Atul Delucia Hulen Skains, DO [facility] [phone]

## 2019-02-20 ENCOUNTER — Other Ambulatory Visit: Payer: Self-pay | Admitting: Nurse Practitioner

## 2019-02-20 DIAGNOSIS — M25512 Pain in left shoulder: Secondary | ICD-10-CM

## 2019-03-11 ENCOUNTER — Telehealth: Payer: Self-pay | Admitting: *Deleted

## 2019-03-11 ENCOUNTER — Ambulatory Visit (INDEPENDENT_AMBULATORY_CARE_PROVIDER_SITE_OTHER): Payer: Medicare Other | Admitting: *Deleted

## 2019-03-11 DIAGNOSIS — E1142 Type 2 diabetes mellitus with diabetic polyneuropathy: Secondary | ICD-10-CM | POA: Diagnosis not present

## 2019-03-11 DIAGNOSIS — G4733 Obstructive sleep apnea (adult) (pediatric): Secondary | ICD-10-CM

## 2019-03-11 DIAGNOSIS — I10 Essential (primary) hypertension: Secondary | ICD-10-CM | POA: Diagnosis not present

## 2019-03-11 DIAGNOSIS — G629 Polyneuropathy, unspecified: Secondary | ICD-10-CM

## 2019-03-11 NOTE — Telephone Encounter (Signed)
03/11/2019  I spoke with Evan Smith by telephone today as a CCM follow-up. He needs to the following:  1. Referral to podiatry (preferably in [place]) for diabetic foot care and ingrown left great toenail 2. New CPAP machine and supplies. Current CPAP is about 65 years old and sleep study is approximately 65 years old as well. May need new sleep study in order for insurance to cover new CPAP machine. Was previously getting supplies from [place].   Let me know if I can be of assistance.   Thank you.  Demetrios Loll, BSN, RN-BC Embedded Chronic Care Manager [facility] / [facility] Care Management Direct Dial: [phone]

## 2019-03-11 NOTE — Chronic Care Management (AMB) (Signed)
Chronic Care Management   Follow Up Note   03/11/2019 Name: TIMOTHY TRUDELL MRN: [id number] DOB: [birth year]  Referred by: Chevis Pretty, FNP Reason for referral : Chronic Care Management (CCM Follow up)   TYRICE HEWITT is a 64 y.o. year old male who is a primary care patient of Chevis Pretty, [place]. The CCM team was consulted for assistance with chronic disease management and care coordination needs.    Review of patient status, including review of consultants reports, relevant laboratory and other test results, and collaboration with appropriate care team members and the patient's provider was performed as part of comprehensive patient evaluation and provision of chronic care management services.    Outpatient Encounter Medications as of 03/11/2019  Medication Sig  . amLODipine (NORVASC) 5 MG tablet Take 1 tablet (5 mg total) by mouth daily.  Marland Kitchen aspirin 81 MG tablet Take 81 mg by mouth daily.  Marland Kitchen azelastine (ASTELIN) 0.1 % nasal spray Place 1 spray into both nostrils 2 (two) times daily. (for drainage)  . Cholecalciferol (VITAMIN D3) 1000 UNITS CAPS Take 1 capsule by mouth daily.   . fluticasone (FLONASE) 50 MCG/ACT nasal spray USE 2 SPRAYS IN EACH NOSTRIL EVERY DAY  . gabapentin (NEURONTIN) 300 MG capsule TAKE ONE CAPSULE BY MOUTH ONCE DAILY AT BEDTIME  . ibuprofen (ADVIL,MOTRIN) 200 MG tablet Take 200 mg by mouth 2 (two) times daily as needed.  Marland Kitchen lisinopril-hydrochlorothiazide (ZESTORETIC) 20-25 MG tablet Take 1 tablet by mouth daily.  Marland Kitchen loratadine (CLARITIN) 10 MG tablet Take 1 tablet (10 mg total) by mouth daily.  . metFORMIN (GLUCOPHAGE-XR) 500 MG 24 hr tablet TAKE 1 TABLET WITH BREAKFAST  . naproxen (NAPROSYN) 500 MG tablet TAKE 1 TABLET TWICE DAILY WITH MEALS  . omeprazole (PRILOSEC) 40 MG capsule TAKE 1 CAPSULE IN THE MORNING  . Potassium Gluconate 80 MG TABS Take 99 mg by mouth.  . sildenafil (REVATIO) 20 MG tablet 1-2 tablets daily prn  . simvastatin (ZOCOR) 40  MG tablet Take 1 tablet (40 mg total) by mouth at bedtime.   No facility-administered encounter medications on file as of 03/11/2019.      RN Care plan   . "I need some help with my feet" (pt-stated)       Current Barriers:  . Chronic Disease Management support and education needs related to diabetic foot care  Nurse Case Manager Clinical Goal(s):  Marland Kitchen Over the next 15 days, patient will work with PCP office to schedule podiatry appointment  Interventions:  . Advised patient to continue checking feet daily looking for any new calluses, blisters, breaks in the skin, or other changes in the skin and/or toenails . Discussed plans with patient for ongoing care management follow up and provided patient with direct contact information for care management team . Reviewed scheduled/upcoming provider appointments including:  . Discussed diabetic foot care . Discussed HPI o Saw Dr Irving Shows several years ago o Has thickened and discolored toenails o Dry rough skin on bottom of feet o No cracks in the skin o Ingrown toenail on Left great toe . Discussed potential podiatry referral. Patient requests a provider in [place] if available.  . RN will collaborate with PCP office to order podiatry referral  Patient Self Care Activities:  . Performs ADL's independently . Performs IADL's independently  Please see past updates related to this goal by clicking on the "Past Updates" button in the selected goal      . "I woule like to monitor my blood pressure  at home" (pt-stated)       Current Barriers:  . Chronic Disease Management support and education needs related to hypertension  Nurse Case Manager Clinical Goal(s):  Marland Kitchen Over the next 30 days, patient will obtain home blood pressure monitor  Interventions:  . Discussed need to monitor blood pressure at home regularly . Advised that [employer] does not cover blood pressure monitors . Recommended that patient purchase monitor for home use.  [employer] has some of the lowest prices locally . Recommended that patient bring blood pressure  monitor with him to next PCP appointment so that they can compare readings from his machine to the office machine  Patient Self Care Activities:  . Performs ADL's independently . Performs IADL's independently  Please see past updates related to this goal by clicking on the "Past Updates" button in the selected goal        Other   . Obtain New Cpap Machine and Supplies       Current Barriers:  Marland Kitchen Knowledge Deficits related to how to obtain new CPAP machine and supplies . Chronic Disease Management support and education needs related to sleep apnea  Nurse Case Manager Clinical Goal(s):  Marland Kitchen Over the next 30 days, patient will work with PCP clinical staff to obtain new CPAP machine and to schedule a sleep study if necessary for insurance coverage  Interventions:  . Talked with patient by telephone o CPAP machine is approx 65 years old and was purchased from [employer]. He was getting supplies from [place].  o Last sleep study was approx 10 years ago . Advised that a new sleep study may be needed for insurance coverage . RN will collaborate with [facility] clinical staff regarding order for new CPAP machine vs sleep study order  Patient Self Care Activities:  . Performs ADL's independently . Performs IADL's independently   Initial goal documentation         Plan:   The care management team will reach out to the patient again over the next 30 days.   Demetrios Loll, BSN, RN-BC Embedded Chronic Care Manager [facility] / [facility] Care Management Direct Dial: [phone]

## 2019-03-11 NOTE — Patient Instructions (Signed)
Visit Information  Goals Addressed            This Visit's Progress     Patient Stated   . "I need some help with my feet" (pt-stated)       Current Barriers:  . Chronic Disease Management support and education needs related to diabetic foot care  Nurse Case Manager Clinical Goal(s):  Marland Kitchen Over the next 15 days, patient will work with PCP office to schedule podiatry appointment  Interventions:  . Advised patient to continue checking feet daily looking for any new calluses, blisters, breaks in the skin, or other changes in the skin and/or toenails . Discussed plans with patient for ongoing care management follow up and provided patient with direct contact information for care management team . Reviewed scheduled/upcoming provider appointments including:  . Discussed diabetic foot care . Discussed HPI o Saw Dr Ulice Brilliant several years ago o Has thickened and discolored toenails o Dry rough skin on bottom of feet o No cracks in the skin o Ingrown toenail on Left great toe . Discussed potential podiatry referral. Patient requests a provider in [place] if available.  . RN will collaborate with PCP office to order podiatry referral  Patient Self Care Activities:  . Performs ADL's independently . Performs IADL's independently  Please see past updates related to this goal by clicking on the "Past Updates" button in the selected goal      . "I woule like to monitor my blood pressure at home" (pt-stated)       Current Barriers:  . Chronic Disease Management support and education needs related to hypertension  Nurse Case Manager Clinical Goal(s):  Marland Kitchen Over the next 30 days, patient will obtain home blood pressure monitor  Interventions:  . Discussed need to monitor blood pressure at home regularly . Advised that [employer] does not cover blood pressure monitors . Recommended that patient purchase monitor for home use. [employer] has some of the lowest prices locally . Recommended  that patient bring blood pressure  monitor with him to next PCP appointment so that they can compare readings from his machine to the office machine  Patient Self Care Activities:  . Performs ADL's independently . Performs IADL's independently  Please see past updates related to this goal by clicking on the "Past Updates" button in the selected goal        Other   . Obtain New Cpap Machine and Supplies       Current Barriers:  Marland Kitchen Knowledge Deficits related to how to obtain new CPAP machine and supplies . Chronic Disease Management support and education needs related to sleep apnea  Nurse Case Manager Clinical Goal(s):  Marland Kitchen Over the next 30 days, patient will work with PCP clinical staff to obtain new CPAP machine and to schedule a sleep study if necessary for insurance coverage  Interventions:  . Talked with patient by telephone o CPAP machine is approx 65 years old and was purchased from [facility]. He was getting supplies from [place].  o Last sleep study was approx 10 years ago . Advised that a new sleep study may be needed for insurance coverage . RN will collaborate with [facility] clinical staff regarding order for new CPAP machine vs sleep study order  Patient Self Care Activities:  . Performs ADL's independently . Performs IADL's independently   Initial goal documentation      The care management team will reach out to the patient again over the next 30 days.  Chong Sicilian, BSN, RN-BC Embedded Chronic Care Manager [facility] Direct Dial: [phone]   The patient verbalized understanding of instructions provided today and declined a print copy of patient instruction materials.

## 2019-03-16 ENCOUNTER — Encounter: Payer: Self-pay | Admitting: *Deleted

## 2019-03-16 ENCOUNTER — Other Ambulatory Visit: Payer: Self-pay | Admitting: Nurse Practitioner

## 2019-03-16 DIAGNOSIS — L600 Ingrowing nail: Secondary | ICD-10-CM

## 2019-03-16 DIAGNOSIS — G4733 Obstructive sleep apnea (adult) (pediatric): Secondary | ICD-10-CM

## 2019-03-16 NOTE — Telephone Encounter (Signed)
Spoke with Burna Mortimer the RT at [facility] and she states that patients pressure setting is at 16.

## 2019-03-16 NOTE — Telephone Encounter (Signed)
Lmtcb with [place] [facility] RT

## 2019-03-16 NOTE — Telephone Encounter (Signed)
Patient states he has no idea what his settings are will call [facility]

## 2019-03-16 NOTE — Progress Notes (Signed)
Please let patient know that referral has been made for podiatist and order placed for new cpcp machine

## 2019-03-16 NOTE — Telephone Encounter (Signed)
Please find out what his settings are on his cpap machine

## 2019-03-19 ENCOUNTER — Other Ambulatory Visit: Payer: Self-pay | Admitting: Nurse Practitioner

## 2019-03-19 DIAGNOSIS — J309 Allergic rhinitis, unspecified: Secondary | ICD-10-CM

## 2019-03-28 ENCOUNTER — Ambulatory Visit: Payer: Medicare HMO | Attending: Internal Medicine

## 2019-03-28 ENCOUNTER — Other Ambulatory Visit: Payer: Self-pay

## 2019-03-28 ENCOUNTER — Ambulatory Visit: Payer: Medicare HMO

## 2019-03-28 DIAGNOSIS — Z23 Encounter for immunization: Secondary | ICD-10-CM | POA: Insufficient documentation

## 2019-03-28 NOTE — Progress Notes (Signed)
   Covid-19 Vaccination Clinic  Name:  Evan Smith    MRN: [id number] DOB: 08/08/1955  03/28/2019  [name] was observed post Covid-19 immunization for 15 minutes without incidence. He was provided with Vaccine Information Sheet and instruction to access the V-Safe system.   [name] was instructed to call 911 with any severe reactions post vaccine: Marland Kitchen Difficulty breathing  . Swelling of your face and throat  . A fast heartbeat  . A bad rash all over your body  . Dizziness and weakness    Immunizations Administered    Name Date Dose VIS Date Route   Moderna COVID-19 Vaccine 03/28/2019  4:44 PM 0.5 mL 01/12/2019 Intramuscular   Manufacturer: Moderna   Lot: [id number]   NDC: 37048-889-16

## 2019-03-30 DIAGNOSIS — L84 Corns and callosities: Secondary | ICD-10-CM | POA: Diagnosis not present

## 2019-03-30 DIAGNOSIS — E1142 Type 2 diabetes mellitus with diabetic polyneuropathy: Secondary | ICD-10-CM | POA: Diagnosis not present

## 2019-03-30 DIAGNOSIS — M79676 Pain in unspecified toe(s): Secondary | ICD-10-CM | POA: Diagnosis not present

## 2019-03-30 DIAGNOSIS — B351 Tinea unguium: Secondary | ICD-10-CM | POA: Diagnosis not present

## 2019-04-01 ENCOUNTER — Ambulatory Visit (INDEPENDENT_AMBULATORY_CARE_PROVIDER_SITE_OTHER): Payer: Medicare HMO | Admitting: *Deleted

## 2019-04-01 DIAGNOSIS — E1142 Type 2 diabetes mellitus with diabetic polyneuropathy: Secondary | ICD-10-CM

## 2019-04-01 DIAGNOSIS — I10 Essential (primary) hypertension: Secondary | ICD-10-CM

## 2019-04-01 DIAGNOSIS — G4733 Obstructive sleep apnea (adult) (pediatric): Secondary | ICD-10-CM

## 2019-04-01 NOTE — Chronic Care Management (AMB) (Signed)
  Chronic Care Management   Follow Up Note   04/01/2019 Name: Evan Smith MRN: [id number] DOB: 06-13-1955  Referred by: Evan Pierini, FNP Reason for referral : Chronic Care Management (RN follow up)   Evan Smith is a 64 y.o. year old male who is a primary care patient of Evan Pierini, FNP. The CCM team was consulted for assistance with chronic disease management and care coordination needs.    Review of patient status, including review of consultants reports, relevant laboratory and other test results, and collaboration with appropriate care team members and the patient's provider was performed as part of comprehensive patient evaluation and provision of chronic care management services.   I spoke with Evan Smith by telephone today.   RN Care Plan   . "I need some help with my feet"        Current Barriers:  . Chronic Disease Management support and education needs related to diabetic foot care  Nurse Case Manager Clinical Goal(s):  Marland Kitchen Over the next 90 days, patient will follow-up with podiatrist as recommended  Interventions:  . Advised patient to continue checking feet daily looking for any new calluses, blisters, breaks in the skin, or other changes in the skin and/or toenails . Discussed plans with patient for ongoing care management follow up and provided patient with direct contact information for care management team . Chart reviewed o Appointment was scheduled with Dr Evan Smith on 03/30/2019 . Talked with patient by phone . Discussed podiatry visit. He was able to see Dr Evan Smith on 03/30/2019. He was advised to follow-up in 2 to 3 months but no follow-up appointment was made.  Marland Kitchen Reached out to Dr Evan Smith office [phone] to see if they will contact him to schedule a follow-up but they were closed due to severe winter weather. Left voicemail requesting that they reach back out to him to schedule that follow-up appt.   Patient Self Care Activities:   . Performs ADL's independently . Performs IADL's independently  Please see past updates related to this goal by clicking on the "Past Updates" button in the selected goal      . Obtain New Cpap Machine and Supplies       Current Barriers:  Marland Kitchen Knowledge Deficits related to how to obtain new CPAP machine and supplies . Chronic Disease Management support and education needs related to sleep apnea  Nurse Case Manager Clinical Goal(s):  Marland Kitchen Over the next 30 days, patient will work with PCP clinical staff to obtain new CPAP machine and to schedule a sleep study if necessary for insurance coverage  Interventions:  . Previously Talked with patient by telephone o CPAP machine is approx 65 years old and was purchased from [employer]. He was getting supplies from [place].  o Last sleep study was approx 10 years ago . Chart reviewed o Clinical staff reached out to [facility] regarding CPAP settings.  o Order for new machine was placed on 03/16/2019 by PCP  Patient Self Care Activities:  . Performs ADL's independently . Performs IADL's independently   Please see past updates related to this goal by clicking on the "Past Updates" button in the selected goal        Follow-up Plan:   The care management team will reach out to the patient again over the next 45 days.   Evan Smith, BSN, RN-BC Embedded Chronic Care Manager [facility] / [facility] Care Management Direct Dial: [phone]

## 2019-04-01 NOTE — Patient Instructions (Signed)
Visit Information  Goals Addressed            This Visit's Progress     Patient Stated   . "I need some help with my feet" (pt-stated)       Current Barriers:  . Chronic Disease Management support and education needs related to diabetic foot care  Nurse Case Manager Clinical Goal(s):  Marland Kitchen Over the next 90 days, patient will follow-up with podiatrist as recommended  Interventions:  . Advised patient to continue checking feet daily looking for any new calluses, blisters, breaks in the skin, or other changes in the skin and/or toenails . Discussed plans with patient for ongoing care management follow up and provided patient with direct contact information for care management team . Chart reviewed o Appointment was scheduled with Dr Ulice Brilliant on 03/30/2019 . Talked with patient by phone . Discussed podiatry visit. He was able to see Dr Ulice Brilliant on 03/30/2019. He was advised to follow-up in 2 to 3 months but no follow-up appointment was made.  Marland Kitchen Reached out to Dr Arvil Chaco office [phone] to see if they will contact him to schedule a follow-up but they were closed due to severe winter weather. Left voicemail requesting that they reach back out to him to schedule that follow-up appt.   Patient Self Care Activities:  . Performs ADL's independently . Performs IADL's independently  Please see past updates related to this goal by clicking on the "Past Updates" button in the selected goal        Other   . Obtain New Cpap Machine and Supplies       Current Barriers:  Marland Kitchen Knowledge Deficits related to how to obtain new CPAP machine and supplies . Chronic Disease Management support and education needs related to sleep apnea  Nurse Case Manager Clinical Goal(s):  Marland Kitchen Over the next 30 days, patient will work with PCP clinical staff to obtain new CPAP machine and to schedule a sleep study if necessary for insurance coverage  Interventions:  . Previously Talked with patient by telephone o CPAP machine  is approx 65 years old and was purchased from [employer]. He was getting supplies from [place].  o Last sleep study was approx 10 years ago . Chart reviewed o Clinical staff reached out to [facility] regarding CPAP settings.  o Order for new machine was placed on 03/16/2019 by PCP  Patient Self Care Activities:  . Performs ADL's independently . Performs IADL's independently   Please see past updates related to this goal by clicking on the "Past Updates" button in the selected goal          The care management team will reach out to the patient again over the next 45 days.    Demetrios Loll, BSN, RN-BC Embedded Chronic Care Manager [facility] / [facility] Care Management Direct Dial: [phone]   The patient verbalized understanding of instructions provided today and declined a print copy of patient instruction materials.

## 2019-04-05 ENCOUNTER — Ambulatory Visit: Payer: Medicare HMO | Admitting: *Deleted

## 2019-04-05 DIAGNOSIS — E1142 Type 2 diabetes mellitus with diabetic polyneuropathy: Secondary | ICD-10-CM

## 2019-04-05 DIAGNOSIS — I10 Essential (primary) hypertension: Secondary | ICD-10-CM

## 2019-04-05 DIAGNOSIS — G4733 Obstructive sleep apnea (adult) (pediatric): Secondary | ICD-10-CM

## 2019-04-05 NOTE — Chronic Care Management (AMB) (Signed)
  Chronic Care Management   Follow Up Note   04/05/2019 Name: Evan Smith MRN: [id number] DOB: 06/18/1955  Referred by: Bennie Pierini, FNP Reason for referral : Chronic Care Management   Evan Smith is a 64 y.o. year old male who is a primary care patient of Bennie Pierini, FNP. The CCM team was consulted for assistance with chronic disease management and care coordination needs.     RN Care Plan           This Visit's Progress   . Obtain New Cpap Machine and Supplies       Sleep apnea in a patient with hypertension and diabetes.  Current Barriers:  Marland Kitchen Knowledge Deficits related to how to obtain new CPAP machine and supplies . Chronic Disease Management support and education needs related to sleep apnea  Nurse Case Manager Clinical Goal(s):  Marland Kitchen Over the next 30 days, patient will work with PCP clinical staff to obtain new CPAP machine and to schedule a sleep study if necessary for insurance coverage  Interventions:  . Previously Talked with patient by telephone o CPAP machine is approx 65 years old and was purchased from [employer]. He was getting supplies from [place].  o Last sleep study was approx 10 years ago . Chart reviewed o Clinical staff reached out to [facility] regarding CPAP settings.  o Order for new machine was placed on 03/16/2019 by PCP o Collaborated with [facility] Clinical staff regarding order for new machine.  . Advised patient that order has been printed and encouraged him to reach out if he has not heard anything from the DME supplier over the next 2 weeks  Patient Self Care Activities:  . Performs ADL's independently . Performs IADL's independently   Please see past updates related to this goal by clicking on the "Past Updates" button in the selected goal          Plan:   The care management team will reach out to the patient again over the next 45 days.    Demetrios Loll, BSN, RN-BC Embedded Chronic Care  Manager [facility] / [facility] Care Management Direct Dial: [phone]

## 2019-04-05 NOTE — Patient Instructions (Signed)
Visit Information  Goals Addressed            This Visit's Progress   . Obtain New Cpap Machine and Supplies       Current Barriers:  Marland Kitchen Knowledge Deficits related to how to obtain new CPAP machine and supplies . Chronic Disease Management support and education needs related to sleep apnea  Nurse Case Manager Clinical Goal(s):  Marland Kitchen Over the next 30 days, patient will work with PCP clinical staff to obtain new CPAP machine and to schedule a sleep study if necessary for insurance coverage  Interventions:  . Previously Talked with patient by telephone o CPAP machine is approx 65 years old and was purchased from [employer]. He was getting supplies from [place].  o Last sleep study was approx 10 years ago . Chart reviewed o Clinical staff reached out to [facility] regarding CPAP settings.  o Order for new machine was placed on 03/16/2019 by PCP o Collaborated with [facility] Clinical staff regarding order for new machine.  . Advised patient that order has been printed and encouraged him to reach out if he has not heard anything from the DME supplier over the next 2 weeks  Patient Self Care Activities:  . Performs ADL's independently . Performs IADL's independently   Please see past updates related to this goal by clicking on the "Past Updates" button in the selected goal        The care management team will reach out to the patient again over the next 45 days.    Demetrios Loll, BSN, RN-BC Embedded Chronic Care Manager [facility] / [facility] Care Management Direct Dial: [phone]   The patient verbalized understanding of instructions provided today and declined a print copy of patient instruction materials.

## 2019-04-07 ENCOUNTER — Telehealth: Payer: Self-pay | Admitting: Nurse Practitioner

## 2019-04-07 NOTE — Telephone Encounter (Signed)
Please order CPAP machine for him- I do not know settings- you may have to ask him.

## 2019-04-07 NOTE — Telephone Encounter (Signed)
Order faxed from 03/16/19

## 2019-04-09 ENCOUNTER — Ambulatory Visit (INDEPENDENT_AMBULATORY_CARE_PROVIDER_SITE_OTHER): Payer: Medicare HMO | Admitting: Family

## 2019-04-09 ENCOUNTER — Encounter: Payer: Self-pay | Admitting: Family

## 2019-04-09 ENCOUNTER — Other Ambulatory Visit: Payer: Self-pay | Admitting: *Deleted

## 2019-04-09 DIAGNOSIS — J3489 Other specified disorders of nose and nasal sinuses: Secondary | ICD-10-CM | POA: Diagnosis not present

## 2019-04-09 DIAGNOSIS — J019 Acute sinusitis, unspecified: Secondary | ICD-10-CM

## 2019-04-09 DIAGNOSIS — K2100 Gastro-esophageal reflux disease with esophagitis, without bleeding: Secondary | ICD-10-CM

## 2019-04-09 DIAGNOSIS — E119 Type 2 diabetes mellitus without complications: Secondary | ICD-10-CM

## 2019-04-09 DIAGNOSIS — M25512 Pain in left shoulder: Secondary | ICD-10-CM

## 2019-04-09 DIAGNOSIS — E782 Mixed hyperlipidemia: Secondary | ICD-10-CM

## 2019-04-09 MED ORDER — TRUE METRIX AIR GLUCOSE METER W/DEVICE KIT: 1.0000 | PACK | Freq: Every day | 0 refills | Status: DC

## 2019-04-09 MED ORDER — AZELASTINE HCL 0.1 % NA SOLN: 1.0000 | Freq: Two times a day (BID) | NASAL | 12 refills | Status: DC

## 2019-04-09 MED ORDER — AMOXICILLIN-POT CLAVULANATE 875-125 MG PO TABS: 1.0000 | ORAL_TABLET | Freq: Two times a day (BID) | ORAL | 0 refills | Status: AC

## 2019-04-09 MED ORDER — TRUE METRIX LEVEL 1 LOW VI SOLN: 3 refills | Status: DC

## 2019-04-09 MED ORDER — TRUE METRIX BLOOD GLUCOSE TEST VI STRP: ORAL_STRIP | 3 refills | Status: DC

## 2019-04-09 MED ORDER — TRUEPLUS LANCETS 33G MISC: 3 refills | Status: DC

## 2019-04-09 MED ORDER — AMLODIPINE BESYLATE 5 MG PO TABS: 5.0000 mg | ORAL_TABLET | Freq: Every day | ORAL | 1 refills | Status: DC

## 2019-04-09 MED ORDER — GABAPENTIN 300 MG PO CAPS: ORAL_CAPSULE | ORAL | 1 refills | Status: DC

## 2019-04-09 MED ORDER — NAPROXEN 500 MG PO TABS: 500.0000 mg | ORAL_TABLET | Freq: Two times a day (BID) | ORAL | 0 refills | Status: DC

## 2019-04-09 MED ORDER — METFORMIN HCL ER 500 MG PO TB24: ORAL_TABLET | ORAL | 0 refills | Status: DC

## 2019-04-09 MED ORDER — BD SWAB SINGLE USE REGULAR PADS: MEDICATED_PAD | 3 refills | Status: DC

## 2019-04-09 MED ORDER — LISINOPRIL-HYDROCHLOROTHIAZIDE 20-25 MG PO TABS: 1.0000 | ORAL_TABLET | Freq: Every day | ORAL | 1 refills | Status: DC

## 2019-04-09 MED ORDER — OMEPRAZOLE 40 MG PO CPDR: DELAYED_RELEASE_CAPSULE | ORAL | 0 refills | Status: DC

## 2019-04-09 MED ORDER — SIMVASTATIN 40 MG PO TABS: 40.0000 mg | ORAL_TABLET | Freq: Every day | ORAL | 0 refills | Status: DC

## 2019-04-09 NOTE — Progress Notes (Signed)
   Virtual Visit via telephone Note Due to COVID-19 pandemic this visit was conducted virtually. This visit type was conducted due to national recommendations for restrictions regarding the COVID-19 Pandemic (e.g. social distancing, sheltering in place) in an effort to limit this patient's exposure and mitigate transmission in our community. All issues noted in this document were discussed and addressed.  A physical exam was not performed with this format.  I connected with Evan Smith on 04/09/19 at 8:50 AM  by telephone and verified that I am speaking with the correct person using two identifiers. Evan Smith is currently located at car and sister is currently with him  during visit. The provider, Jannifer Rodney, FNP is located in their office at time of visit.  I discussed the limitations, risks, security and privacy concerns of performing an evaluation and management service by telephone and the availability of in person appointments. I also discussed with the patient that there may be a patient responsible charge related to this service. The patient expressed understanding and agreed to proceed.   History and Present Illness:  Sinusitis This is a new problem. The current episode started 1 to 4 weeks ago. The problem has been gradually worsening since onset. There has been no fever. His pain is at a severity of 2/10. The pain is moderate. Associated symptoms include congestion, coughing, a hoarse voice, sinus pressure and sneezing. Pertinent negatives include no ear pain, headaches or sore throat. Treatments tried: Claritin. The treatment provided no relief.      Review of Systems  HENT: Positive for congestion, hoarse voice, sinus pressure and sneezing. Negative for ear pain and sore throat.   Respiratory: Positive for cough.   Neurological: Negative for headaches.  All other systems reviewed and are negative.    Observations/Objective: No SOB or distress noted  Assessment  and Plan: 1. Rhinorrhea - azelastine (ASTELIN) 0.1 % nasal spray; Place 1 spray into both nostrils 2 (two) times daily. (for drainage)  Dispense: 30 mL; Refill: 12  2. Acute sinusitis, recurrence not specified, unspecified location - Take meds as prescribed - Use a cool mist humidifier  -Use saline nose sprays frequently -Force fluids -For any cough or congestion  Use plain Mucinex- regular strength or max strength is fine -For fever or aces or pains- take tylenol or ibuprofen. -RTO if symptoms worsen or do not improve  - amoxicillin-clavulanate (AUGMENTIN) 875-125 MG tablet; Take 1 tablet by mouth 2 (two) times daily for 10 days.  Dispense: 20 tablet; Refill: 0     I discussed the assessment and treatment plan with the patient. The patient was provided an opportunity to ask questions and all were answered. The patient agreed with the plan and demonstrated an understanding of the instructions.   The patient was advised to call back or seek an in-person evaluation if the symptoms worsen or if the condition fails to improve as anticipated.  The above assessment and management plan was discussed with the patient. The patient verbalized understanding of and has agreed to the management plan. Patient is aware to call the clinic if symptoms persist or worsen. Patient is aware when to return to the clinic for a follow-up visit. Patient educated on when it is appropriate to go to the emergency department.   Time call ended:  9:02 AM   I provided 12 minutes of non-face-to-face time during this encounter.    Jannifer Rodney, FNP

## 2019-04-09 NOTE — Addendum Note (Signed)
Addended by: Julious Payer D on: 04/09/2019 09:40 AM   Modules accepted: Orders

## 2019-04-12 ENCOUNTER — Other Ambulatory Visit: Payer: Self-pay | Admitting: *Deleted

## 2019-04-12 DIAGNOSIS — I10 Essential (primary) hypertension: Secondary | ICD-10-CM

## 2019-04-12 DIAGNOSIS — G629 Polyneuropathy, unspecified: Secondary | ICD-10-CM

## 2019-04-12 MED ORDER — LISINOPRIL-HYDROCHLOROTHIAZIDE 20-25 MG PO TABS: 1.0000 | ORAL_TABLET | Freq: Every day | ORAL | 0 refills | Status: DC

## 2019-04-12 MED ORDER — AMLODIPINE BESYLATE 5 MG PO TABS: 5.0000 mg | ORAL_TABLET | Freq: Every day | ORAL | 0 refills | Status: DC

## 2019-04-12 MED ORDER — GABAPENTIN 300 MG PO CAPS: ORAL_CAPSULE | ORAL | 0 refills | Status: DC

## 2019-04-19 ENCOUNTER — Telehealth: Payer: Self-pay | Admitting: Nurse Practitioner

## 2019-04-19 ENCOUNTER — Other Ambulatory Visit: Payer: Self-pay | Admitting: *Deleted

## 2019-04-19 DIAGNOSIS — I10 Essential (primary) hypertension: Secondary | ICD-10-CM

## 2019-04-19 DIAGNOSIS — K2100 Gastro-esophageal reflux disease with esophagitis, without bleeding: Secondary | ICD-10-CM

## 2019-04-19 DIAGNOSIS — E119 Type 2 diabetes mellitus without complications: Secondary | ICD-10-CM

## 2019-04-19 DIAGNOSIS — M25512 Pain in left shoulder: Secondary | ICD-10-CM

## 2019-04-19 DIAGNOSIS — E782 Mixed hyperlipidemia: Secondary | ICD-10-CM

## 2019-04-19 DIAGNOSIS — G629 Polyneuropathy, unspecified: Secondary | ICD-10-CM

## 2019-04-19 MED ORDER — TRUE METRIX LEVEL 1 LOW VI SOLN: 3 refills | Status: DC

## 2019-04-19 MED ORDER — AMLODIPINE BESYLATE 5 MG PO TABS: 5.0000 mg | ORAL_TABLET | Freq: Every day | ORAL | 0 refills | Status: DC

## 2019-04-19 MED ORDER — METFORMIN HCL ER 500 MG PO TB24: ORAL_TABLET | ORAL | 0 refills | Status: DC

## 2019-04-19 MED ORDER — TRUE METRIX BLOOD GLUCOSE TEST VI STRP: ORAL_STRIP | 3 refills | Status: DC

## 2019-04-19 MED ORDER — LISINOPRIL-HYDROCHLOROTHIAZIDE 20-25 MG PO TABS: 1.0000 | ORAL_TABLET | Freq: Every day | ORAL | 0 refills | Status: DC

## 2019-04-19 MED ORDER — OMEPRAZOLE 40 MG PO CPDR: DELAYED_RELEASE_CAPSULE | ORAL | 0 refills | Status: DC

## 2019-04-19 MED ORDER — BD SWAB SINGLE USE REGULAR PADS: MEDICATED_PAD | 3 refills | Status: DC

## 2019-04-19 MED ORDER — SIMVASTATIN 40 MG PO TABS: 40.0000 mg | ORAL_TABLET | Freq: Every day | ORAL | 0 refills | Status: DC

## 2019-04-19 MED ORDER — TRUE METRIX AIR GLUCOSE METER W/DEVICE KIT: 1.0000 | PACK | Freq: Every day | 0 refills | Status: AC

## 2019-04-19 MED ORDER — GABAPENTIN 300 MG PO CAPS: ORAL_CAPSULE | ORAL | 0 refills | Status: DC

## 2019-04-19 MED ORDER — TRUEPLUS LANCETS 33G MISC: 3 refills | Status: DC

## 2019-04-19 MED ORDER — NAPROXEN 500 MG PO TABS: 500.0000 mg | ORAL_TABLET | Freq: Two times a day (BID) | ORAL | 0 refills | Status: DC

## 2019-04-19 NOTE — Addendum Note (Signed)
Addended by: Julious Payer D on: 04/19/2019 04:00 PM   Modules accepted: Orders

## 2019-04-19 NOTE — Telephone Encounter (Signed)
Written script placed on providers desk for signature 

## 2019-04-19 NOTE — Addendum Note (Signed)
Addended by: Julious Payer D on: 04/19/2019 03:56 PM   Modules accepted: Orders

## 2019-04-19 NOTE — Telephone Encounter (Signed)
  Medication Request  04/19/2019  What is the name of the medication? Cpap machine  Have you contacted your pharmacy to request a refill? no  Which pharmacy would you like this sent to? [facility]? or [facility]?   Patient notified that their request is being sent to the clinical staff for review and that they should receive a call once it is complete. If they do not receive a call within 24 hours they can check with their pharmacy or our office.   MMM's pt.  Please call pt.  He is still waiting on RX.

## 2019-04-22 NOTE — Telephone Encounter (Signed)
Form faxed to [employer] [phone]

## 2019-04-23 DIAGNOSIS — G4733 Obstructive sleep apnea (adult) (pediatric): Secondary | ICD-10-CM | POA: Diagnosis not present

## 2019-04-25 ENCOUNTER — Ambulatory Visit: Payer: Medicare HMO | Attending: Internal Medicine

## 2019-04-25 DIAGNOSIS — Z23 Encounter for immunization: Secondary | ICD-10-CM

## 2019-04-25 NOTE — Progress Notes (Signed)
   Covid-19 Vaccination Clinic  Name:  Evan Smith    MRN: [id number] DOB: Oct 06, 1955  04/25/2019  Evan Smith was observed post Covid-19 immunization for 15 minutes without incident. He was provided with Vaccine Information Sheet and instruction to access the V-Safe system.   Evan Smith was instructed to call 911 with any severe reactions post vaccine: Marland Kitchen Difficulty breathing  . Swelling of face and throat  . A fast heartbeat  . A bad rash all over body  . Dizziness and weakness   Immunizations Administered    Name Date Dose VIS Date Route   Moderna COVID-19 Vaccine 04/25/2019 12:51 PM 0.5 mL 01/12/2019 Intramuscular   Manufacturer: Moderna   Lot: [id number]   NDC: 68864-847-20

## 2019-05-10 ENCOUNTER — Telehealth: Payer: Medicare HMO

## 2019-05-14 ENCOUNTER — Ambulatory Visit: Payer: Self-pay | Admitting: Nurse Practitioner

## 2019-05-17 ENCOUNTER — Encounter: Payer: Self-pay | Admitting: Nurse Practitioner

## 2019-05-17 ENCOUNTER — Ambulatory Visit (INDEPENDENT_AMBULATORY_CARE_PROVIDER_SITE_OTHER): Payer: Medicare HMO | Admitting: Nurse Practitioner

## 2019-05-17 ENCOUNTER — Other Ambulatory Visit: Payer: Self-pay

## 2019-05-17 VITALS — BP 114/75 | HR 72 | Temp 99.6°F | Resp 20 | Ht 68.0 in | Wt 281.0 lb

## 2019-05-17 DIAGNOSIS — D709 Neutropenia, unspecified: Secondary | ICD-10-CM

## 2019-05-17 DIAGNOSIS — Z1211 Encounter for screening for malignant neoplasm of colon: Secondary | ICD-10-CM

## 2019-05-17 DIAGNOSIS — E782 Mixed hyperlipidemia: Secondary | ICD-10-CM

## 2019-05-17 DIAGNOSIS — M25512 Pain in left shoulder: Secondary | ICD-10-CM | POA: Diagnosis not present

## 2019-05-17 DIAGNOSIS — G4733 Obstructive sleep apnea (adult) (pediatric): Secondary | ICD-10-CM | POA: Diagnosis not present

## 2019-05-17 DIAGNOSIS — E119 Type 2 diabetes mellitus without complications: Secondary | ICD-10-CM

## 2019-05-17 DIAGNOSIS — E1142 Type 2 diabetes mellitus with diabetic polyneuropathy: Secondary | ICD-10-CM

## 2019-05-17 DIAGNOSIS — Z87891 Personal history of nicotine dependence: Secondary | ICD-10-CM

## 2019-05-17 DIAGNOSIS — I10 Essential (primary) hypertension: Secondary | ICD-10-CM

## 2019-05-17 DIAGNOSIS — K2100 Gastro-esophageal reflux disease with esophagitis, without bleeding: Secondary | ICD-10-CM

## 2019-05-17 DIAGNOSIS — G629 Polyneuropathy, unspecified: Secondary | ICD-10-CM | POA: Diagnosis not present

## 2019-05-17 DIAGNOSIS — N5201 Erectile dysfunction due to arterial insufficiency: Secondary | ICD-10-CM | POA: Diagnosis not present

## 2019-05-17 DIAGNOSIS — Z1212 Encounter for screening for malignant neoplasm of rectum: Secondary | ICD-10-CM

## 2019-05-17 LAB — BAYER DCA HB A1C WAIVED: HB A1C (BAYER DCA - WAIVED): 5.8 % (ref <7.0)

## 2019-05-17 MED ORDER — GABAPENTIN 300 MG PO CAPS: ORAL_CAPSULE | ORAL | 1 refills | Status: DC

## 2019-05-17 MED ORDER — OMEPRAZOLE 40 MG PO CPDR: DELAYED_RELEASE_CAPSULE | ORAL | 1 refills | Status: DC

## 2019-05-17 MED ORDER — SILDENAFIL CITRATE 20 MG PO TABS: ORAL_TABLET | ORAL | 5 refills | Status: DC

## 2019-05-17 MED ORDER — NAPROXEN 500 MG PO TABS: 500.0000 mg | ORAL_TABLET | Freq: Two times a day (BID) | ORAL | 2 refills | Status: DC

## 2019-05-17 MED ORDER — LISINOPRIL-HYDROCHLOROTHIAZIDE 20-25 MG PO TABS: 1.0000 | ORAL_TABLET | Freq: Every day | ORAL | 1 refills | Status: DC

## 2019-05-17 MED ORDER — METFORMIN HCL ER 500 MG PO TB24: ORAL_TABLET | ORAL | 1 refills | Status: DC

## 2019-05-17 MED ORDER — AMLODIPINE BESYLATE 5 MG PO TABS: 5.0000 mg | ORAL_TABLET | Freq: Every day | ORAL | 1 refills | Status: DC

## 2019-05-17 NOTE — Patient Instructions (Signed)
  Cologuard  Your provider has prescribed Cologuard, an easy-to-use, noninvasive test for colon cancer screening, based on the latest advances in stool DNA science.   Here's what will happen next:  1. You may receive a call or email from [facility] to confirm your mailing address and insurance information 2. Your kit will be shipped directly to you 3. You collect your stool sample in the privacy of your own home. Please follow the instructions that come with the kit 4. You return the kit via [facility] shipping or pick-up, in the same box it arrived in 5. You should receive a call with the results once they are available. If you do not receive a call, please contact our office at [phone]  Insurance Coverage  Cologuard is covered by Medicare and most major insurers Cologuard is covered by Medicare and Medicare Advantage with no co-pay or deductible for eligible patients ages 19-85. [employer], more than 94% of Cologuard patients have no out-of-pocket cost for screening. . Based on the [place] should be covered by most private insurers with no co-pay or deductible for eligible patients (ages 13-75; at average risk for colon cancer; without symptoms). Currently, ~74% of Cologuard patients 45-49 have had no out-of-pocket cost for screening.  . Many national and regional payers have begun paying for CRC screening at 45. [employer] continues to work with payers to expand coverage and access for patients ages 38-49.   Only your healthcare insurance provider can confirm how Cologuard will be covered for you. If you have questions about coverage, you can contact your insurance company directly or ask the specialists at [employer] to do that for you. A Customer Support Specialist can be reached at [phone].    Patient Support Screening for colon cancer is very important to your good health, so if you have any questions at all, please call [employer], commercial Customer Support Specialists at [phone]. They are available 24 hours a day, 6 days a week. An instructional video is available to view online at [email or web address]   *Information and graphics obtained from [email or web address]

## 2019-05-17 NOTE — Progress Notes (Addendum)
Subjective:    Patient ID: Evan Smith, male    DOB: 1956/02/02, 64 y.o.   MRN: [id number]   Chief Complaint: Medical Management of Chronic Issues    HPI:  1. Essential hypertension No c/o chest pain, sob or headache. Does not check blood pressure at home. BP Readings from Last 3 Encounters:  11/12/18 116/73  09/08/18 114/72  03/05/18 123/74    '  2. Type 2 diabetes mellitus with diabetic polyneuropathy, without long-term current use of insulin (HCC) Fasting blood sugars are unknown. He has no glucometer. He says Mcarthur Rossetti is suppose o be sending him a machine. He denies any symptoms of low blood sugar. Lab Results  Component Value Date   HGBA1C 5.9 11/12/2018     3. Mixed hyperlipidemia does not watch diet and does very little exercise Lab Results  Component Value Date   CHOL 167 11/12/2018   HDL 60 11/12/2018   LDLCALC 87 11/12/2018   TRIG 114 11/12/2018   CHOLHDL 2.8 11/12/2018     4. Neuropathy Has constant burning of bil feet. Is on neurontin daily which helps  5. Obstructive sleep apnea syndrome Wears CPAP nightly. Says he feels rested in mornings  6. Severe obesity (BMI >= 40) (HCC) No recent weight changes Wt Readings from Last 3 Encounters:  05/17/19 281 lb (127.5 kg)  11/12/18 282 lb (127.9 kg)  09/08/18 284 lb (128.8 kg)   BMI Readings from Last 3 Encounters:  05/17/19 42.73 kg/m  11/12/18 42.88 kg/m  09/08/18 43.18 kg/m      Outpatient Encounter Medications as of 05/17/2019  Medication Sig  . Alcohol Swabs (B-D SINGLE USE SWABS REGULAR) PADS Test BS daily Dx E11.9  . amLODipine (NORVASC) 5 MG tablet Take 1 tablet (5 mg total) by mouth daily.  Marland Kitchen aspirin 81 MG tablet Take 81 mg by mouth daily.  Marland Kitchen azelastine (ASTELIN) 0.1 % nasal spray Place 1 spray into both nostrils 2 (two) times daily. (for drainage)  . Blood Glucose Calibration (TRUE METRIX LEVEL 1) Low SOLN Test BS daily Dx E11.9  . Blood Glucose Monitoring Suppl (TRUE METRIX AIR  GLUCOSE METER) w/Device KIT 1 Device by Does not apply route daily. Dx E11.9  . Cholecalciferol (VITAMIN D3) 1000 UNITS CAPS Take 1 capsule by mouth daily.   . fluticasone (FLONASE) 50 MCG/ACT nasal spray USE 2 SPRAYS IN EACH NOSTRIL EVERY DAY  . gabapentin (NEURONTIN) 300 MG capsule TAKE ONE CAPSULE BY MOUTH ONCE DAILY AT BEDTIME  . glucose blood (TRUE METRIX BLOOD GLUCOSE TEST) test strip Test BS daily Dx E11.9  . ibuprofen (ADVIL,MOTRIN) 200 MG tablet Take 200 mg by mouth 2 (two) times daily as needed.  Marland Kitchen lisinopril-hydrochlorothiazide (ZESTORETIC) 20-25 MG tablet Take 1 tablet by mouth daily.  Marland Kitchen loratadine (CLARITIN) 10 MG tablet TAKE 1 TABLET EVERY DAY  . metFORMIN (GLUCOPHAGE-XR) 500 MG 24 hr tablet TAKE 1 TABLET WITH BREAKFAST  . naproxen (NAPROSYN) 500 MG tablet Take 1 tablet (500 mg total) by mouth 2 (two) times daily with a meal.  . omeprazole (PRILOSEC) 40 MG capsule TAKE 1 CAPSULE IN THE MORNING  . Potassium Gluconate 80 MG TABS Take 99 mg by mouth.  . sildenafil (REVATIO) 20 MG tablet 1-2 tablets daily prn  . simvastatin (ZOCOR) 40 MG tablet Take 1 tablet (40 mg total) by mouth at bedtime.  . TRUEplus Lancets 33G MISC Test BS daily Dx E11.9     Past Surgical History:  Procedure Laterality Date  . CERVICAL  FUSION  1990's  . EVALUATION UNDER ANESTHESIA WITH ANAL FISTULECTOMY N/A 08/03/2012   Procedure: EXAM UNDER ANESTHESIA WITH ANAL FISTULECTOMY ;  Surgeon: Leighton Ruff, MD;  Location: [place];  Service: General;  Laterality: N/A;  . INCISION AND DRAINAGE PERIRECTAL ABSCESS  02/13/2012   Procedure: IRRIGATION AND DEBRIDEMENT PERIRECTAL ABSCESS;  Surgeon: Earnstine Regal, MD;  Location: [place];  Service: General;  Laterality: N/A;  . LUMBAR FUSION  2010   has had 2 low back surgeries   . RIGHT HYDROCELECTOMY  07-28-2001  . SHOULDER ARTHROSCOPY WITH ROTATOR CUFF REPAIR Left 39's    Family History  Problem Relation Age of Onset  . Brain cancer Mother         tumor  . Diabetes Mother   . Kidney Stones Father   . Diabetes Sister   . Heart disease Sister   . GI problems Sister   . Cancer Brother        prostate  . Cancer Brother        lymphoma   . Heart disease Brother   . Blindness Son   . Arthritis Son        trouble with legs  . Heart disease Paternal Uncle   . Arthritis Son        shoulder and back     New complaints: None today  Social history: Lives with his nephew.  Controlled substance contract: n/a    Review of Systems  Constitutional: Negative for diaphoresis.  Eyes: Negative for pain.  Respiratory: Negative for shortness of breath.   Cardiovascular: Negative for chest pain, palpitations and leg swelling.  Gastrointestinal: Negative for abdominal pain.  Endocrine: Negative for polydipsia.  Skin: Negative for rash.  Neurological: Negative for dizziness, weakness and headaches.  Hematological: Does not bruise/bleed easily.  All other systems reviewed and are negative.      Objective:   Physical Exam Vitals and nursing note reviewed.  Constitutional:      Appearance: Normal appearance. He is well-developed.  HENT:     Head: Normocephalic.     Nose: Nose normal.  Eyes:     Pupils: Pupils are equal, round, and reactive to light.  Neck:     Thyroid: No thyroid mass or thyromegaly.     Vascular: No carotid bruit or JVD.     Trachea: Phonation normal.  Cardiovascular:     Rate and Rhythm: Normal rate and regular rhythm.  Pulmonary:     Effort: Pulmonary effort is normal. No respiratory distress.     Breath sounds: Normal breath sounds.  Abdominal:     General: Bowel sounds are normal.     Palpations: Abdomen is soft.     Tenderness: There is no abdominal tenderness.  Musculoskeletal:        General: Normal range of motion.     Cervical back: Normal range of motion and neck supple.     Right lower leg: Edema (1+) present.     Left lower leg: Edema (1+) present.     Comments: Slow to rise from sitting  to standing.  gait slow and steady  Lymphadenopathy:     Cervical: No cervical adenopathy.  Skin:    General: Skin is warm and dry.  Neurological:     Mental Status: He is alert and oriented to person, place, and time.  Psychiatric:        Behavior: Behavior normal.        Thought Content: Thought content  normal.        Judgment: Judgment normal.     BP 114/75   Pulse 72   Temp 99.6 F (37.6 C) (Temporal)   Resp 20   Ht _0  (1.727 m)   Wt 281 lb (127.5 kg)   SpO2 97%   BMI 42.73 kg/m    Hgb1c  5.8%  EKG- NSRR- Mary-Margaret Hassell Done, FNP       Assessment & Plan:  RUARI MUDGETT comes in today with chief complaint of Medical Management of Chronic Issues   Diagnosis and orders addressed:  1. Essential hypertension *low sodium diet** - CMP14+EGFR - CBC with Differential/Platelet - amLODipine (NORVASC) 5 MG tablet; Take 1 tablet (5 mg total) by mouth daily.  Dispense: 90 tablet; Refill: 1 - lisinopril-hydrochlorothiazide (ZESTORETIC) 20-25 MG tablet; Take 1 tablet by mouth daily.  Dispense: 90 tablet; Refill: 1 - EKG 12-Lead  2. Type 2 diabetes mellitus with diabetic polyneuropathy, without long-term current use of insulin (HCC) Continue to watch carbs on diet - Bayer DCA Hb A1c Waived  metFORMIN (GLUCOPHAGE-XR) 500 MG 24 hr tablet; TAKE 1 TABLET WITH BREAKFAST  Dispense: 90 tablet; Refill: 1  3. Mixed hyperlipidemia Low fat diet - Lipid panel  4. Neuropathy Do not go barefooted Keep follow up appointments with neurology - gabapentin (NEURONTIN) 300 MG capsule; TAKE ONE CAPSULE BY MOUTH ONCE DAILY AT BEDTIME  Dispense: 90 capsule; Refill: 1  5. Obstructive sleep apnea syndrome Wear cpap nghtly  6. Severe obesity (BMI >= 40) (HCC) Discussed diet and exercise for person with BMI >25 Will recheck weight in 3-6 months   7. Erectile dysfunction due to arterial insufficiency - sildenafil (REVATIO) 20 MG tablet; 1-2 tablets daily prn  Dispense: 30 tablet;  Refill: 5 - 8. Gastroesophageal reflux disease with esophagitis  Avoid spicy foods Do not eat 2 hours prior to bedtime - omeprazole (PRILOSEC) 40 MG capsule; TAKE 1 CAPSULE IN THE MORNING  Dispense: 90 capsule; Refill: 1  10. Pain in joint of left shoulder - naproxen (NAPROSYN) 500 MG tablet; Take 1 tablet (500 mg total) by mouth 2 (two) times daily with a meal.  Dispense: 180 tablet; Refill: 2  11. Hx of smoking - CT CHEST LUNG CA SCREEN LOW DOSE W/O CM; Future  12. Encounter for screening for colorectal malignant neoplasm - Cologuard   Labs pending Health Maintenance reviewed Diet and exercise encouraged  Follow up plan: 3 months    Mary-Margaret Hassell Done, FNP

## 2019-05-18 LAB — CMP14+EGFR
ALT: 20 IU/L (ref 0–44)
AST: 23 IU/L (ref 0–40)
Albumin/Globulin Ratio: 1.9 (ref 1.2–2.2)
Albumin: 4.6 g/dL (ref 3.8–4.8)
Alkaline Phosphatase: 58 IU/L (ref 39–117)
BUN/Creatinine Ratio: 18 (ref 10–24)
BUN: 27 mg/dL (ref 8–27)
Bilirubin Total: 0.4 mg/dL (ref 0.0–1.2)
CO2: 23 mmol/L (ref 20–29)
Calcium: 9.5 mg/dL (ref 8.6–10.2)
Chloride: 100 mmol/L (ref 96–106)
Creatinine, Ser: 1.47 mg/dL — ABNORMAL HIGH (ref 0.76–1.27)
GFR calc Af Amer: 57 mL/min/{1.73_m2} — ABNORMAL LOW (ref >59)
GFR calc non Af Amer: 50 mL/min/{1.73_m2} — ABNORMAL LOW (ref >59)
Globulin, Total: 2.4 g/dL (ref 1.5–4.5)
Glucose: 90 mg/dL (ref 65–99)
Potassium: 4.8 mmol/L (ref 3.5–5.2)
Sodium: 138 mmol/L (ref 134–144)
Total Protein: 7 g/dL (ref 6.0–8.5)

## 2019-05-18 LAB — LIPID PANEL
Chol/HDL Ratio: 3 ratio (ref 0.0–5.0)
Cholesterol, Total: 137 mg/dL (ref 100–199)
HDL: 45 mg/dL (ref >39)
LDL Chol Calc (NIH): 64 mg/dL (ref 0–99)
Triglycerides: 168 mg/dL — ABNORMAL HIGH (ref 0–149)
VLDL Cholesterol Cal: 28 mg/dL (ref 5–40)

## 2019-05-18 LAB — CBC WITH DIFFERENTIAL/PLATELET
Basophils Absolute: 0 10*3/uL (ref 0.0–0.2)
Basos: 1 %
EOS (ABSOLUTE): 0.1 10*3/uL (ref 0.0–0.4)
Eos: 2 %
Hematocrit: 39.5 % (ref 37.5–51.0)
Hemoglobin: 13 g/dL (ref 13.0–17.7)
Immature Grans (Abs): 0 10*3/uL (ref 0.0–0.1)
Immature Granulocytes: 0 %
Lymphocytes Absolute: 1 10*3/uL (ref 0.7–3.1)
Lymphs: 34 %
MCH: 32.1 pg (ref 26.6–33.0)
MCHC: 32.9 g/dL (ref 31.5–35.7)
MCV: 98 fL — ABNORMAL HIGH (ref 79–97)
Monocytes Absolute: 0.4 10*3/uL (ref 0.1–0.9)
Monocytes: 13 %
Neutrophils Absolute: 1.5 10*3/uL (ref 1.4–7.0)
Neutrophils: 50 %
Platelets: 169 10*3/uL (ref 150–450)
RBC: 4.05 x10E6/uL — ABNORMAL LOW (ref 4.14–5.80)
RDW: 12.7 % (ref 11.6–15.4)
WBC: 2.9 10*3/uL — ABNORMAL LOW (ref 3.4–10.8)

## 2019-05-18 NOTE — Addendum Note (Signed)
Addended by: Bennie Pierini on: 05/18/2019 07:53 AM   Modules accepted: Orders

## 2019-05-24 DIAGNOSIS — G4733 Obstructive sleep apnea (adult) (pediatric): Secondary | ICD-10-CM | POA: Diagnosis not present

## 2019-05-26 ENCOUNTER — Telehealth: Payer: Self-pay | Admitting: Nurse Practitioner

## 2019-05-26 ENCOUNTER — Other Ambulatory Visit: Payer: Self-pay | Admitting: Family Medicine

## 2019-05-26 DIAGNOSIS — M549 Dorsalgia, unspecified: Secondary | ICD-10-CM

## 2019-05-26 MED ORDER — CYCLOBENZAPRINE HCL 10 MG PO TABS: 10.0000 mg | ORAL_TABLET | Freq: Three times a day (TID) | ORAL | 1 refills | Status: DC | PRN

## 2019-05-26 NOTE — Telephone Encounter (Signed)
Covering pcp please advise. 

## 2019-05-26 NOTE — Telephone Encounter (Signed)
I sent in the requested prescription 

## 2019-05-26 NOTE — Telephone Encounter (Signed)
Patient aware.

## 2019-05-26 NOTE — Telephone Encounter (Signed)
  Prescription Request  05/26/2019  What is the name of the medication or equipment? flexeril  Have you contacted your pharmacy to request a refill? (if applicable) No  Which pharmacy would you like this sent to? [facility], pt was doing some yard work last week and having back spasms now, was prescribed this in the past and it worked would like refill    Patient notified that their request is being sent to the clinical staff for review and that they should receive a response within 2 business days.

## 2019-06-01 ENCOUNTER — Other Ambulatory Visit: Payer: Self-pay | Admitting: *Deleted

## 2019-06-01 DIAGNOSIS — M549 Dorsalgia, unspecified: Secondary | ICD-10-CM

## 2019-06-01 MED ORDER — FLUTICASONE PROPIONATE 50 MCG/ACT NA SUSP: 2.0000 | Freq: Every day | NASAL | 1 refills | Status: DC

## 2019-06-09 ENCOUNTER — Other Ambulatory Visit: Payer: Self-pay | Admitting: *Deleted

## 2019-06-09 DIAGNOSIS — M549 Dorsalgia, unspecified: Secondary | ICD-10-CM

## 2019-06-09 NOTE — Telephone Encounter (Signed)
Fax from [facility] to mail order pharmacy Cyclobenzaprine 10 mg

## 2019-06-10 MED ORDER — CYCLOBENZAPRINE HCL 10 MG PO TABS: 10.0000 mg | ORAL_TABLET | Freq: Three times a day (TID) | ORAL | 1 refills | Status: DC | PRN

## 2019-06-14 ENCOUNTER — Other Ambulatory Visit: Payer: Self-pay | Admitting: *Deleted

## 2019-06-14 DIAGNOSIS — E782 Mixed hyperlipidemia: Secondary | ICD-10-CM

## 2019-06-14 DIAGNOSIS — G629 Polyneuropathy, unspecified: Secondary | ICD-10-CM

## 2019-06-14 MED ORDER — SIMVASTATIN 40 MG PO TABS: 40.0000 mg | ORAL_TABLET | Freq: Every day | ORAL | 1 refills | Status: DC

## 2019-06-14 MED ORDER — GABAPENTIN 300 MG PO CAPS: ORAL_CAPSULE | ORAL | 0 refills | Status: DC

## 2019-06-23 DIAGNOSIS — G4733 Obstructive sleep apnea (adult) (pediatric): Secondary | ICD-10-CM | POA: Diagnosis not present

## 2019-06-25 ENCOUNTER — Other Ambulatory Visit: Payer: Self-pay | Admitting: Nurse Practitioner

## 2019-06-25 ENCOUNTER — Other Ambulatory Visit: Payer: Self-pay | Admitting: Family Medicine

## 2019-06-25 DIAGNOSIS — M549 Dorsalgia, unspecified: Secondary | ICD-10-CM

## 2019-07-06 DIAGNOSIS — Z1212 Encounter for screening for malignant neoplasm of rectum: Secondary | ICD-10-CM | POA: Diagnosis not present

## 2019-07-09 LAB — EXTERNAL GENERIC LAB PROCEDURE: COLOGUARD: NEGATIVE

## 2019-07-09 LAB — COLOGUARD: COLOGUARD: NEGATIVE

## 2019-07-24 DIAGNOSIS — G4733 Obstructive sleep apnea (adult) (pediatric): Secondary | ICD-10-CM | POA: Diagnosis not present

## 2019-07-26 DIAGNOSIS — G4733 Obstructive sleep apnea (adult) (pediatric): Secondary | ICD-10-CM | POA: Diagnosis not present

## 2019-07-28 ENCOUNTER — Other Ambulatory Visit: Payer: Self-pay | Admitting: Nurse Practitioner

## 2019-07-28 DIAGNOSIS — J309 Allergic rhinitis, unspecified: Secondary | ICD-10-CM

## 2019-07-29 LAB — COLOGUARD: Cologuard: NEGATIVE

## 2019-08-19 ENCOUNTER — Encounter: Payer: Self-pay | Admitting: Nurse Practitioner

## 2019-08-19 ENCOUNTER — Ambulatory Visit (INDEPENDENT_AMBULATORY_CARE_PROVIDER_SITE_OTHER): Payer: Medicare HMO | Admitting: Nurse Practitioner

## 2019-08-19 ENCOUNTER — Other Ambulatory Visit: Payer: Self-pay

## 2019-08-19 VITALS — BP 111/73 | HR 77 | Temp 98.2°F | Resp 20 | Ht 68.0 in | Wt 288.0 lb

## 2019-08-19 DIAGNOSIS — G8929 Other chronic pain: Secondary | ICD-10-CM

## 2019-08-19 DIAGNOSIS — E782 Mixed hyperlipidemia: Secondary | ICD-10-CM

## 2019-08-19 DIAGNOSIS — Z23 Encounter for immunization: Secondary | ICD-10-CM | POA: Diagnosis not present

## 2019-08-19 DIAGNOSIS — E1142 Type 2 diabetes mellitus with diabetic polyneuropathy: Secondary | ICD-10-CM

## 2019-08-19 DIAGNOSIS — I10 Essential (primary) hypertension: Secondary | ICD-10-CM

## 2019-08-19 DIAGNOSIS — M25512 Pain in left shoulder: Secondary | ICD-10-CM

## 2019-08-19 DIAGNOSIS — G629 Polyneuropathy, unspecified: Secondary | ICD-10-CM

## 2019-08-19 DIAGNOSIS — G4733 Obstructive sleep apnea (adult) (pediatric): Secondary | ICD-10-CM | POA: Diagnosis not present

## 2019-08-19 LAB — LIPID PANEL
Chol/HDL Ratio: 3.4 ratio (ref 0.0–5.0)
Cholesterol, Total: 142 mg/dL (ref 100–199)
HDL: 42 mg/dL (ref >39)
LDL Chol Calc (NIH): 78 mg/dL (ref 0–99)
Triglycerides: 122 mg/dL (ref 0–149)
VLDL Cholesterol Cal: 22 mg/dL (ref 5–40)

## 2019-08-19 LAB — CBC WITH DIFFERENTIAL/PLATELET
Basophils Absolute: 0 10*3/uL (ref 0.0–0.2)
Basos: 1 %
EOS (ABSOLUTE): 0.1 10*3/uL (ref 0.0–0.4)
Eos: 3 %
Hematocrit: 37.8 % (ref 37.5–51.0)
Hemoglobin: 12.6 g/dL — ABNORMAL LOW (ref 13.0–17.7)
Immature Grans (Abs): 0 10*3/uL (ref 0.0–0.1)
Immature Granulocytes: 0 %
Lymphocytes Absolute: 1 10*3/uL (ref 0.7–3.1)
Lymphs: 32 %
MCH: 31.5 pg (ref 26.6–33.0)
MCHC: 33.3 g/dL (ref 31.5–35.7)
MCV: 95 fL (ref 79–97)
Monocytes Absolute: 0.3 10*3/uL (ref 0.1–0.9)
Monocytes: 11 %
Neutrophils Absolute: 1.6 10*3/uL (ref 1.4–7.0)
Neutrophils: 53 %
Platelets: 165 10*3/uL (ref 150–450)
RBC: 4 x10E6/uL — ABNORMAL LOW (ref 4.14–5.80)
RDW: 12.8 % (ref 11.6–15.4)
WBC: 3 10*3/uL — ABNORMAL LOW (ref 3.4–10.8)

## 2019-08-19 LAB — CMP14+EGFR
ALT: 19 IU/L (ref 0–44)
AST: 26 IU/L (ref 0–40)
Albumin/Globulin Ratio: 1.7 (ref 1.2–2.2)
Albumin: 4.3 g/dL (ref 3.8–4.8)
Alkaline Phosphatase: 56 IU/L (ref 48–121)
BUN/Creatinine Ratio: 13 (ref 10–24)
BUN: 20 mg/dL (ref 8–27)
Bilirubin Total: 0.3 mg/dL (ref 0.0–1.2)
CO2: 24 mmol/L (ref 20–29)
Calcium: 8.9 mg/dL (ref 8.6–10.2)
Chloride: 98 mmol/L (ref 96–106)
Creatinine, Ser: 1.5 mg/dL — ABNORMAL HIGH (ref 0.76–1.27)
GFR calc Af Amer: 56 mL/min/{1.73_m2} — ABNORMAL LOW (ref >59)
GFR calc non Af Amer: 48 mL/min/{1.73_m2} — ABNORMAL LOW (ref >59)
Globulin, Total: 2.5 g/dL (ref 1.5–4.5)
Glucose: 98 mg/dL (ref 65–99)
Potassium: 4.3 mmol/L (ref 3.5–5.2)
Sodium: 136 mmol/L (ref 134–144)
Total Protein: 6.8 g/dL (ref 6.0–8.5)

## 2019-08-19 LAB — BAYER DCA HB A1C WAIVED: HB A1C (BAYER DCA - WAIVED): 5.6 % (ref <7.0)

## 2019-08-19 NOTE — Patient Instructions (Signed)
Adhesive Capsulitis  Adhesive capsulitis, also called frozen shoulder, causes the shoulder to become stiff and painful to move. This condition happens when there is inflammation of the tendons and ligaments that surround the shoulder joint (shoulder capsule). What are the causes? This condition may be caused by:  An injury to your shoulder joint.  Straining your shoulder.  Not moving your shoulder for a period of time. This can happen if your arm was injured or in a sling.  Long-standing conditions, such as: ? Diabetes. ? Thyroid problems. ? Heart disease. ? Stroke. ? Rheumatoid arthritis. ? Lung disease. In some cases, the cause is not known. What increases the risk? You are more likely to develop this condition if you are:  A woman.  Older than 65 years of age. What are the signs or symptoms? Symptoms of this condition include:  Pain in your shoulder when you move your arm. There may also be pain when parts of your shoulder are touched. The pain may be worse at night or when you are resting.  A sore or aching shoulder.  The inability to move your shoulder normally.  Muscle spasms. How is this diagnosed? This condition is diagnosed with a physical exam and imaging tests, such as an X-ray or MRI. How is this treated? This condition may be treated with:  Treatment of the underlying cause or condition.  Medicine. Medicine may be given to relieve pain, inflammation, or muscle spasms.  Steroid injections into the shoulder joint.  Physical therapy. This involves performing exercises to get the shoulder moving again.  Acupuncture. This is a type of treatment that involves stimulating specific points on your body by inserting thin needles through your skin.  Shoulder manipulation. This is a procedure to move the shoulder into another position. It is done after you are given a medicine to make you fall asleep (general anesthetic). The joint may also be injected with salt  water at high pressure to break down scarring.  Surgery. This may be done in severe cases when other treatments have failed. Although most people recover completely from adhesive capsulitis, some may not regain full shoulder movement. Follow these instructions at home: Managing pain, stiffness, and swelling      If directed, put ice on the injured area: ? Put ice in a plastic bag. ? Place a towel between your skin and the bag. ? Leave the ice on for 20 minutes, 2-3 times per day.  If directed, apply heat to the affected area before you exercise. Use the heat source that your health care provider recommends, such as a moist heat pack or a heating pad. ? Place a towel between your skin and the heat source. ? Leave the heat on for 20-30 minutes. ? Remove the heat if your skin turns bright red. This is especially important if you are unable to feel pain, heat, or cold. You may have a greater risk of getting burned. General instructions  Take over-the-counter and prescription medicines only as told by your health care provider.  If you are being treated with physical therapy, follow instructions from your physical therapist.  Avoid exercises that put a lot of demand on your shoulder, such as throwing. These exercises can make pain worse.  Keep all follow-up visits as told by your health care provider. This is important. Contact a health care provider if:  You develop new symptoms.  Your symptoms get worse. Summary  Adhesive capsulitis, also called frozen shoulder, causes the shoulder to become   stiff and painful to move.  You are more likely to have this condition if you are a woman and over age 40.  It is treated with physical therapy, medicines, and sometimes surgery. This information is not intended to replace advice given to you by your health care provider. Make sure you discuss any questions you have with your health care provider. Document Revised: 07/04/2017 Document  Reviewed: 07/04/2017 Elsevier Patient Education  2020 [employer] Inc.  

## 2019-08-19 NOTE — Progress Notes (Signed)
Subjective:    Patient ID: Evan Smith, male    DOB: 1955/06/16, 64 y.o.   MRN: [id number]   Chief Complaint: Medical Management of Chronic Issues (bilateral shoulder pain)    HPI:  1. Essential hypertension No c/o chest pain, sob or headaches. Does not check blood pressure at home. BP Readings from Last 3 Encounters:  08/19/19 111/73  05/17/19 114/75  11/12/18 116/73     2. Type 2 diabetes mellitus with diabetic polyneuropathy, without long-term current use of insulin ([place]) He does not check blood sugars at home. He oes ot like pricking his finger and he has a hard time with his machine. Denies symptoms of low blood sugar. Lab Results  Component Value Date   HGBA1C 5.8 05/17/2019     3. Mixed hyperlipidemia He does try to watch his diet. He tries ton walk for exercise at lest every other day Lab Results  Component Value Date   CHOL 137 05/17/2019   HDL 45 05/17/2019   LDLCALC 64 05/17/2019   TRIG 168 (H) 05/17/2019   CHOLHDL 3.0 05/17/2019   .  4. Obstructive sleep apnea syndrome Wears CPAP machine nightly. Feels rested in mornings  5. Neuropathy Denies any numbness or tingling of feet.  6. Severe obesity (BMI >= 40) (HCC) Weight is up 7lbs from last visit Wt Readings from Last 3 Encounters:  08/19/19 288 lb (130.6 kg)  05/17/19 281 lb (127.5 kg)  11/12/18 282 lb (127.9 kg)   BMI Readings from Last 3 Encounters:  08/19/19 43.79 kg/m  05/17/19 42.73 kg/m  11/12/18 42.88 kg/m       Outpatient Encounter Medications as of 08/19/2019  Medication Sig  . Alcohol Swabs (B-D SINGLE USE SWABS REGULAR) PADS Test BS daily Dx E11.9  . amLODipine (NORVASC) 5 MG tablet Take 1 tablet (5 mg total) by mouth daily.  Marland Kitchen aspirin 81 MG tablet Take 81 mg by mouth daily.  . Blood Glucose Calibration (TRUE METRIX LEVEL 1) Low SOLN Test BS daily Dx E11.9  . Blood Glucose Monitoring Suppl (TRUE METRIX AIR GLUCOSE METER) w/Device KIT 1 Device by Does not apply route  daily. Dx E11.9  . Cholecalciferol (VITAMIN D3) 1000 UNITS CAPS Take 1 capsule by mouth daily.   . cyclobenzaprine (FLEXERIL) 10 MG tablet Take 1 tablet by mouth three times daily as needed for muscle spasm  . fluticasone (FLONASE) 50 MCG/ACT nasal spray Place 2 sprays into both nostrils daily.  Marland Kitchen gabapentin (NEURONTIN) 300 MG capsule TAKE ONE CAPSULE BY MOUTH ONCE DAILY AT BEDTIME  . glucose blood (TRUE METRIX BLOOD GLUCOSE TEST) test strip Test BS daily Dx E11.9  . ibuprofen (ADVIL,MOTRIN) 200 MG tablet Take 200 mg by mouth 2 (two) times daily as needed.  Marland Kitchen lisinopril-hydrochlorothiazide (ZESTORETIC) 20-25 MG tablet Take 1 tablet by mouth daily.  Marland Kitchen loratadine (CLARITIN) 10 MG tablet TAKE 1 TABLET EVERY DAY  . metFORMIN (GLUCOPHAGE-XR) 500 MG 24 hr tablet TAKE 1 TABLET WITH BREAKFAST  . naproxen (NAPROSYN) 500 MG tablet Take 1 tablet (500 mg total) by mouth 2 (two) times daily with a meal.  . omeprazole (PRILOSEC) 40 MG capsule TAKE 1 CAPSULE IN THE MORNING  . Potassium Gluconate 80 MG TABS Take 99 mg by mouth.  . sildenafil (REVATIO) 20 MG tablet 1-2 tablets daily prn  . simvastatin (ZOCOR) 40 MG tablet Take 1 tablet (40 mg total) by mouth at bedtime.  . TRUEplus Lancets 33G MISC Test BS daily Dx E11.9  Past Surgical History:  Procedure Laterality Date  . CERVICAL FUSION  1990's  . EVALUATION UNDER ANESTHESIA WITH ANAL FISTULECTOMY N/A 08/03/2012   Procedure: EXAM UNDER ANESTHESIA WITH ANAL FISTULECTOMY ;  Surgeon: Leighton Ruff, MD;  Location: [facility];  Service: General;  Laterality: N/A;  . INCISION AND DRAINAGE PERIRECTAL ABSCESS  02/13/2012   Procedure: IRRIGATION AND DEBRIDEMENT PERIRECTAL ABSCESS;  Surgeon: Earnstine Regal, MD;  Location: [place];  Service: General;  Laterality: N/A;  . LUMBAR FUSION  2010   has had 2 low back surgeries   . RIGHT HYDROCELECTOMY  07-28-2001  . SHOULDER ARTHROSCOPY WITH ROTATOR CUFF REPAIR Left 58's    Family History  Problem  Relation Age of Onset  . Brain cancer Mother        tumor  . Diabetes Mother   . Kidney Stones Father   . Diabetes Sister   . Heart disease Sister   . GI problems Sister   . Cancer Brother        prostate  . Cancer Brother        lymphoma   . Heart disease Brother   . Blindness Son   . Arthritis Son        trouble with legs  . Heart disease Paternal Uncle   . Arthritis Son        shoulder and back     New complaints: Limited rom of left shoulder- he has had surgery on that shoulder in th epast.  Social history: Lives with her nephew  Controlled substance contract: n/a    Review of Systems  Constitutional: Negative for diaphoresis.  Eyes: Negative for pain.  Respiratory: Negative for shortness of breath.   Cardiovascular: Negative for chest pain, palpitations and leg swelling.  Gastrointestinal: Negative for abdominal pain.  Endocrine: Negative for polydipsia.  Skin: Negative for rash.  Neurological: Negative for dizziness, weakness and headaches.  Hematological: Does not bruise/bleed easily.  All other systems reviewed and are negative.      Objective:   Physical Exam Vitals and nursing note reviewed.  Constitutional:      Appearance: Normal appearance. He is well-developed.  HENT:     Head: Normocephalic.     Nose: Nose normal.  Eyes:     Pupils: Pupils are equal, round, and reactive to light.  Neck:     Thyroid: No thyroid mass or thyromegaly.     Vascular: No carotid bruit or JVD.     Trachea: Phonation normal.  Cardiovascular:     Rate and Rhythm: Normal rate and regular rhythm.  Pulmonary:     Effort: Pulmonary effort is normal. No respiratory distress.     Breath sounds: Normal breath sounds.  Abdominal:     General: Bowel sounds are normal.     Palpations: Abdomen is soft.     Tenderness: There is no abdominal tenderness.  Musculoskeletal:        General: Normal range of motion.     Cervical back: Normal range of motion and neck supple.      Comments: Limited ROM of left shoulder- cannot not fully ext, nor abduct  Lymphadenopathy:     Cervical: No cervical adenopathy.  Skin:    General: Skin is warm and dry.  Neurological:     Mental Status: He is alert and oriented to person, place, and time.  Psychiatric:        Behavior: Behavior normal.        Thought Content:  Thought content normal.        Judgment: Judgment normal.    BP 111/73   Pulse 77   Temp 98.2 F (36.8 C) (Temporal)   Resp 20   Ht _0  (1.727 m)   Wt 288 lb (130.6 kg)   SpO2 99%   BMI 43.79 kg/m   hgba1c 5.6%      Assessment & Plan:  Evan Smith comes in today with chief complaint of Medical Management of Chronic Issues (bilateral shoulder pain)   Diagnosis and orders addressed:  1. Essential hypertension Low sodium diet - CBC with Differential/Platelet - CMP14+EGFR  2. Type 2 diabetes mellitus with diabetic polyneuropathy, without long-term current use of insulin (HCC) cointinue to watch carbs in diet - Bayer DCA Hb A1c Waived  3. Mixed hyperlipidemia Low fat diet - Lipid panel  4. Obstructive sleep apnea syndrome Continue to wear CPAP nightly  5. Neuropathy Do not go barefooted  6. Severe obesity (BMI >= 40) (HCC) Discussed diet and exercise for person with BMI >25 Will recheck weight in 3-6 months  7. Chronic left shoulder pain Referral to ortho Moist heat may help - Ambulatory referral to Orthopedic Surgery   Labs pending Health Maintenance reviewed Diet and exercise encouraged  Follow up plan: 3 months   Mary-Margaret Hassell Done, FNP

## 2019-08-19 NOTE — Addendum Note (Signed)
Addended by: Cleda Daub on: 08/19/2019 11:43 AM   Modules accepted: Orders

## 2019-08-23 DIAGNOSIS — G4733 Obstructive sleep apnea (adult) (pediatric): Secondary | ICD-10-CM | POA: Diagnosis not present

## 2019-08-27 DIAGNOSIS — M25511 Pain in right shoulder: Secondary | ICD-10-CM | POA: Diagnosis not present

## 2019-08-27 DIAGNOSIS — M542 Cervicalgia: Secondary | ICD-10-CM | POA: Diagnosis not present

## 2019-08-27 DIAGNOSIS — M25512 Pain in left shoulder: Secondary | ICD-10-CM | POA: Diagnosis not present

## 2019-09-23 DIAGNOSIS — G4733 Obstructive sleep apnea (adult) (pediatric): Secondary | ICD-10-CM | POA: Diagnosis not present

## 2019-09-29 ENCOUNTER — Other Ambulatory Visit: Payer: Self-pay | Admitting: Nurse Practitioner

## 2019-09-29 DIAGNOSIS — I10 Essential (primary) hypertension: Secondary | ICD-10-CM

## 2019-09-29 DIAGNOSIS — K2100 Gastro-esophageal reflux disease with esophagitis, without bleeding: Secondary | ICD-10-CM

## 2019-09-29 DIAGNOSIS — E119 Type 2 diabetes mellitus without complications: Secondary | ICD-10-CM

## 2019-10-24 DIAGNOSIS — G4733 Obstructive sleep apnea (adult) (pediatric): Secondary | ICD-10-CM | POA: Diagnosis not present

## 2019-10-27 DIAGNOSIS — G4733 Obstructive sleep apnea (adult) (pediatric): Secondary | ICD-10-CM | POA: Diagnosis not present

## 2019-11-02 ENCOUNTER — Encounter: Payer: Self-pay | Admitting: Emergency Medicine

## 2019-11-02 NOTE — Progress Notes (Signed)
[facility]    Provider:  Dr Jaynee Eagles Requesting Provider: Sharlotte Alamo MD Primary Care Provider:  Chevis Pretty, FNP  CC:  Arm pain  HPI:  Evan Smith is a 64 y.o. male here as requested by Sharlotte Alamo MD for arm weakness. PMHx obstructive sleep apnea on CPAP, hypertension, hyperlipidemia, diabetes, degenerative disc disease, arthritis, neuropathy, severe obesity.  I reviewed Sharlotte Alamo MD's notes: Patient had a long history of issues including neck surgery 20 years prior and the left cuff repair about 20 years ago, patient was seen in July 2021, but he reported over prior few months on the left side painless loss of motion in his arm, unable to raise it, some numbness and tingling occasionally, and also neck pain/cervical spine pain, examination showed decreased axillary nerve sensation as compared to the other side, weak supraspinatus, infraspinatus and subscapularis with testing, x-rays of the cervical spine demonstrated significant C5-C6 spondylosis with significant spondylosis throughout, moderate spondylosis about the remainder of the neck, bilateral shoulder x-rays demonstrated a type II acromion, preserved glenohumeral space, mild AC arthrosis, diagnosis thought that right side was possibly rotator cuff syndrome, left side either massive cuff tear versus most likely cervical issues and possibly axillary nerve palsy.  They started on Celebrex and referred up here and they were worried something is going on in his neck causing loss of function on his left side.  Had prior surgery on the neck and left cuff remotely, he has chronic neck pain, about a year ago started having weakness and decreased range of motion left arm no pain on the left arm, he has right shoulder but that is old/chronic and worsening but it is in the shoulder and different from the left arm. Left arm painless loss of motion, unable to raise it, weakness is proximal, distal left arm seems ok, more  proximal. He has some neck pain, unclear radicular symptoms, ortho suspected right cuff-related but the left was unclear maybe from the neck. He has a little numbness in the digits 1-3 of the left hand. Also numbness in the fingers of the right hand.  No leg issues. No changes in bowel or bladder, no new problems walking, no problems with any facial weakness, speech, swallowing, tremors, weight loss, no cramping or noted fasciculations, he feels numbness radiating down the arm based on neck positions.   Reviewed notes, labs and imaging from outside physicians, which showed:   CT head 10/14/2016: showed No acute intracranial abnormalities including mass lesion or mass effect, hydrocephalus, extra-axial fluid collection, midline shift, hemorrhage, or acute infarction, large ischemic events (personally reviewed images)   CMP with abnormal renal tests, cbc with mild anemia otherwise unremarkable  Review of Systems: Patient complains of symptoms per HPI as well as the following symptoms: arm pain and weakness. Pertinent negatives and positives per HPI. All others negative.   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, music - disability   Tobacco Use   Smoking status: Former Smoker    Packs/day: 0.50    Years: 25.00    Pack years: 12.50    Types: Cigarettes    Quit date: 02/12/2004    Years since quitting: 15.7   Smokeless tobacco: Never Used  Media planner  Vaping Use: Never used  Substance and Sexual Activity   Alcohol use: Yes    Comment: occassional   Drug use: No   Sexual activity: Yes  Other Topics Concern   Not on file  Social History Narrative   Lives by self   No caffeine, very seldom   Right handed   Social Determinants of Health   Financial  Resource Strain:    Difficulty of Paying Living Expenses: Not on file  Food Insecurity:    Worried About [place] in the Last Year: Not on file   [employer] of Food in the Last Year: Not on file  Transportation Needs:    Lack of Transportation (Medical): Not on file   Lack of Transportation (Non-Medical): Not on file  Physical Activity:    Days of Exercise per Week: Not on file   Minutes of Exercise per Session: Not on file  Stress:    Feeling of Stress : Not on file  Social Connections:    Frequency of Communication with Friends and Family: Not on file   Frequency of Social Gatherings with Friends and Family: Not on file   Attends Religious Services: Not on file   Active Member of Clubs or Organizations: Not on file   Attends Archivist Meetings: Not on file   Marital Status: Not on file  Intimate Partner Violence:    Fear of Current or Ex-Partner: Not on file   Emotionally Abused: Not on file   Physically Abused: Not on file   Sexually Abused: Not on file    Family History  Problem Relation Age of Onset   Brain cancer Mother        tumor   Diabetes Mother    Kidney Stones Father    Diabetes Sister    Heart disease Sister    GI problems Sister    Cancer Brother        prostate   Cancer Brother        lymphoma    Heart disease Brother    Blindness Son    Arthritis Son        trouble with legs   Heart disease Paternal Uncle    Arthritis Son        shoulder and back     Past Medical History:  Diagnosis Date   Allergic rhinitis    by referral on 11/02/19/   Arthritis    Cataract    Chronic left shoulder pain    From Referal received 11/02/19/   DDD (degenerative disc disease)    GERD (gastroesophageal reflux disease)    Hyperlipidemia    Hypertension    OSA on CPAP    MODERATE OSA PER STUDY 2010   Perianal fistula    PONV (postoperative nausea and vomiting)    and hear to wake   Severe obesity  (BMI >= 40) ([place])    Received from [facility] on 11/02/19/   Type 2 diabetes mellitus with diabetic polyneuropathy, without long-term current use of insulin [facility])    Referal     Patient Active Problem List   Diagnosis Date Noted   Cervical radiculopathy at C6 11/03/2019   Allergic rhinitis 11/24/2015   Severe obesity (BMI >= 40) ([place]) 11/24/2015   Neuropathy 03/24/2015   Diabetes ([place]) 03/09/2015   Hypertension 02/13/2012   Hyperlipidemia 02/13/2012   Sleep apnea 02/13/2012    Past Surgical History:  Procedure Laterality Date   CERVICAL FUSION  1990's  EVALUATION UNDER ANESTHESIA WITH ANAL FISTULECTOMY N/A 08/03/2012   Procedure: EXAM UNDER ANESTHESIA WITH ANAL FISTULECTOMY ;  Surgeon: Leighton Ruff, MD;  Location: [place];  Service: General;  Laterality: N/A;   INCISION AND DRAINAGE PERIRECTAL ABSCESS  02/13/2012   Procedure: IRRIGATION AND DEBRIDEMENT PERIRECTAL ABSCESS;  Surgeon: Earnstine Regal, MD;  Location: [place];  Service: General;  Laterality: N/A;   LUMBAR FUSION  2010   has had 2 low back surgeries    RIGHT HYDROCELECTOMY  07-28-2001   SHOULDER ARTHROSCOPY WITH ROTATOR CUFF REPAIR Left 1990's    Current Outpatient Medications  Medication Sig Dispense Refill   Alcohol Swabs (B-D SINGLE USE SWABS REGULAR) PADS Test BS daily Dx E11.9 100 each 3   amLODipine (NORVASC) 5 MG tablet TAKE 1 TABLET EVERY DAY 90 tablet 1   aspirin 81 MG tablet Take 81 mg by mouth daily.     Blood Glucose Calibration (TRUE METRIX LEVEL 1) Low SOLN Test BS daily Dx E11.9 1 each 3   Blood Glucose Monitoring Suppl (TRUE METRIX AIR GLUCOSE METER) w/Device KIT 1 Device by Does not apply route daily. Dx E11.9 1 kit 0   Cholecalciferol (VITAMIN D3) 1000 UNITS CAPS Take 1 capsule by mouth daily.      cyclobenzaprine (FLEXERIL) 10 MG tablet Take 1 tablet by mouth three times daily as needed for muscle spasm 30 tablet 0   fluticasone (FLONASE) 50 MCG/ACT nasal spray  Place 2 sprays into both nostrils daily. 48 g 1   gabapentin (NEURONTIN) 300 MG capsule TAKE ONE CAPSULE BY MOUTH ONCE DAILY AT BEDTIME 90 capsule 0   glucose blood (TRUE METRIX BLOOD GLUCOSE TEST) test strip Test BS daily Dx E11.9 100 each 3   ibuprofen (ADVIL,MOTRIN) 200 MG tablet Take 200 mg by mouth 2 (two) times daily as needed.     lisinopril-hydrochlorothiazide (ZESTORETIC) 20-25 MG tablet TAKE 1 TABLET EVERY DAY 90 tablet 1   loratadine (CLARITIN) 10 MG tablet TAKE 1 TABLET EVERY DAY 90 tablet 1   metFORMIN (GLUCOPHAGE-XR) 500 MG 24 hr tablet TAKE 1 TABLET WITH BREAKFAST 90 tablet 1   naproxen (NAPROSYN) 500 MG tablet Take 1 tablet (500 mg total) by mouth 2 (two) times daily with a meal. 180 tablet 2   omeprazole (PRILOSEC) 40 MG capsule TAKE 1 CAPSULE EVERY MORNING 90 capsule 1   Potassium Gluconate 80 MG TABS Take 99 mg by mouth.     sildenafil (REVATIO) 20 MG tablet 1-2 tablets daily prn 30 tablet 5   simvastatin (ZOCOR) 40 MG tablet Take 1 tablet (40 mg total) by mouth at bedtime. 90 tablet 1   TRUEplus Lancets 33G MISC Test BS daily Dx E11.9 100 each 3   No current facility-administered medications for this visit.    Allergies as of 11/03/2019 - Review Complete 11/03/2019  Allergen Reaction Noted   Hydrocodeine [dihydrocodeine] Nausea And Vomiting 02/06/2012    Vitals: BP 131/75 (BP Location: Left Arm, Patient Position: Sitting)    Pulse 91    Ht _0  (1.727 m)    Wt 292 lb 12.8 oz (132.8 kg)    BMI 44.52 kg/m  Last Weight:  Wt Readings from Last 1 Encounters:  11/03/19 292 lb 12.8 oz (132.8 kg)   Last Height:   Ht Readings from Last 1 Encounters:  11/03/19 _1  (1.727 m)     Physical exam: Exam: Gen: NAD, conversant, well nourised, obese, well groomed  CV: RRR, no MRG. No Carotid Bruits. No peripheral edema, warm, nontender Eyes: Conjunctivae clear without exudates or hemorrhage  Neuro: Detailed Neurologic Exam  Speech:     Speech is normal; fluent and spontaneous with normal comprehension.  Cognition:    The patient is oriented to person, place, and time;     recent and remote memory intact;     language fluent;     normal attention, concentration,     fund of knowledge Cranial Nerves:    The pupils are equal, round, and reactive to light. The fundi flat. Visual fields are full to finger confrontation. Extraocular movements are intact. Trigeminal sensation is intact and the muscles of mastication are normal. The face is symmetric. The palate elevates in the midline. Hearing intact. Voice is normal. Shoulder shrug is normal. The tongue has normal motion without fasciculations.   Coordination:    No dysmetria or ataxia  Gait:  normal native gait, wide based slightly due to large body habitus  Motor Observation:    No asymmetry, no atrophy, and no involuntary movements noted. Tone:    Normal muscle tone.    Posture:    Posture is normal. normal erect    Strength: left Deltoid 2+/5, left biceps and triceps 3+-4/5. Mild left pronator teres weakness. Otherwise strength is V/V in the upper and lower limbs.      Sensation: intact to LT     Reflex Exam:  DTR's: Hypo AJs. Otherwise deep tendon reflexes in the upper and lower extremities are symmetrical bilaterally.   Toes:    The toes are downgoing bilaterally.   Clonus:    Clonus is absent.   Negative hoffman's, negative McPhalen's maneuver and Negative Tinels at the wrist  Assessment/Plan:  Patient with > 6 months of neck pain, radiating sensory changes down the left arm, progressive left arm painless weakness, numbness left arm, right proximal arm pain, has been under the care or multiple physicians including Ortho, failed conservative measures needs MR cervical spine and emg/ncs. Suspect left C5/C6 radiculopathy, given his significant weakness will need surgery if confirmed.   Emg/ncs bilateral uppers, MRI cervical spine.   Orders Placed This  Encounter  Procedures   MR CERVICAL SPINE WO CONTRAST   NCV with EMG(electromyography)   No orders of the defined types were placed in this encounter.   Cc: Hassell Done, Mary-Margaret, *,  Chevis Pretty, FNP, Sharlotte Alamo MD  Sarina Ill, MD  [facility] [address] [place], [place] [address]  Phone [phone] Fax [phone]

## 2019-11-02 NOTE — Progress Notes (Signed)
Abstract from Referral from [facility], Dr. Everardo Pacific.

## 2019-11-03 ENCOUNTER — Ambulatory Visit: Payer: Medicare HMO | Admitting: Neurology

## 2019-11-03 ENCOUNTER — Encounter: Payer: Self-pay | Admitting: Neurology

## 2019-11-03 ENCOUNTER — Telehealth: Payer: Self-pay | Admitting: Neurology

## 2019-11-03 VITALS — BP 131/75 | HR 91 | Ht 68.0 in | Wt 292.8 lb

## 2019-11-03 DIAGNOSIS — M5412 Radiculopathy, cervical region: Secondary | ICD-10-CM | POA: Diagnosis not present

## 2019-11-03 DIAGNOSIS — G8929 Other chronic pain: Secondary | ICD-10-CM

## 2019-11-03 DIAGNOSIS — M542 Cervicalgia: Secondary | ICD-10-CM

## 2019-11-03 DIAGNOSIS — M79601 Pain in right arm: Secondary | ICD-10-CM | POA: Diagnosis not present

## 2019-11-03 DIAGNOSIS — R200 Anesthesia of skin: Secondary | ICD-10-CM

## 2019-11-03 DIAGNOSIS — M6289 Other specified disorders of muscle: Secondary | ICD-10-CM

## 2019-11-03 DIAGNOSIS — R202 Paresthesia of skin: Secondary | ICD-10-CM

## 2019-11-03 DIAGNOSIS — M6281 Muscle weakness (generalized): Secondary | ICD-10-CM

## 2019-11-03 DIAGNOSIS — R29898 Other symptoms and signs involving the musculoskeletal system: Secondary | ICD-10-CM

## 2019-11-03 NOTE — Patient Instructions (Signed)
MRI of the cervical spine EMG/NCS of both arms (unless the MRI cervical spine is diagnostic then can cancel)   Cervical Radiculopathy  Cervical radiculopathy means that a nerve in the neck (a cervical nerve) is pinched or bruised. This can happen because of an injury to the cervical spine (vertebrae) in the neck, or as a normal part of getting older. This can cause pain or loss of feeling (numbness) that runs from your neck all the way down to your arm and fingers. Often, this condition gets better with rest. Treatment may be needed if the condition does not get better. What are the causes?  A neck injury.  A bulging disk in your spine.  Muscle movements that you cannot control (muscle spasms).  Tight muscles in your neck due to overuse.  Arthritis.  Breakdown in the bones and joints of the spine (spondylosis) due to getting older.  Bone spurs that form near the nerves in the neck. What are the signs or symptoms?  Pain. The pain may: ? Run from the neck to the arm and hand. ? Be very bad or irritating. ? Be worse when you move your neck.  Loss of feeling or tingling in your arm or hand.  Weakness in your arm or hand, in very bad cases. How is this treated? In many cases, treatment is not needed for this condition. With rest, the condition often gets better over time. If treatment is needed, options may include:  Wearing a soft neck collar (cervical collar) for short periods of time, as told by your doctor.  Doing exercises (physical therapy) to strengthen your neck muscles.  Taking medicines.  Having shots (injections) in your spine, in very bad cases.  Having surgery. This may be needed if other treatments do not help. The type of surgery that is used depends on the cause of your condition. Follow these instructions at home: If you have a soft neck collar:  Wear it as told by your doctor. Remove it only as told by your doctor.  Ask your doctor if you can remove the  collar for cleaning and bathing. If you are allowed to remove the collar for cleaning or bathing: ? Follow instructions from your doctor about how to remove the collar safely. ? Clean the collar by wiping it with mild soap and water and drying it completely. ? Take out any removable pads in the collar every 1-2 days. Wash them by hand with soap and water. Let them air-dry completely before you put them back in the collar. ? Check your skin under the collar for redness or sores. If you see any, tell your doctor. Managing pain      Take over-the-counter and prescription medicines only as told by your doctor.  If told, put ice on the painful area. ? If you have a soft neck collar, remove it as told by your doctor. ? Put ice in a plastic bag. ? Place a towel between your skin and the bag. ? Leave the ice on for 20 minutes, 2-3 times a day.  If using ice does not help, you can try using heat. Use the heat source that your doctor recommends, such as a moist heat pack or a heating pad. ? Place a towel between your skin and the heat source. ? Leave the heat on for 20-30 minutes. ? Remove the heat if your skin turns bright red. This is very important if you are unable to feel pain, heat, or cold. You  may have a greater risk of getting burned.  You may try a gentle neck and shoulder rub (massage). Activity  Rest as needed.  Return to your normal activities as told by your doctor. Ask your doctor what activities are safe for you.  Do exercises as told by your doctor or physical therapist.  Do not lift anything that is heavier than 10 lb (4.5 kg) until your doctor tells you that it is safe. General instructions  Use a flat pillow when you sleep.  Do not drive while wearing a soft neck collar. If you do not have a soft neck collar, ask your doctor if it is safe to drive while your neck heals.  Ask your doctor if the medicine prescribed to you requires you to avoid driving or using heavy  machinery.  Do not use any products that contain nicotine or tobacco, such as cigarettes, e-cigarettes, and chewing tobacco. These can delay healing. If you need help quitting, ask your doctor.  Keep all follow-up visits as told by your doctor. This is important. Contact a doctor if:  Your condition does not get better with treatment. Get help right away if:  Your pain gets worse and is not helped with medicine.  You lose feeling or feel weak in your hand, arm, face, or leg.  You have a high fever.  You have a stiff neck.  You cannot control when you poop or pee (have incontinence).  You have trouble with walking, balance, or talking. Summary  Cervical radiculopathy means that a nerve in the neck is pinched or bruised.  A nerve can get pinched from a bulging disk, arthritis, an injury to the neck, or other causes.  Symptoms include pain, tingling, or loss of feeling that goes from the neck into the arm or hand.  Weakness in your arm or hand can happen in very bad cases.  Treatment may include resting, wearing a soft neck collar, and doing exercises. You might need to take medicines for pain. In very bad cases, shots or surgery may be needed. This information is not intended to replace advice given to you by your health care provider. Make sure you discuss any questions you have with your health care provider. Document Revised: 12/19/2017 Document Reviewed: 12/19/2017 Elsevier Patient Education  2020 [employer] Inc[employer]  Electromyoneurogram Electromyoneurogram is a test to check how well your muscles and nerves are working. This procedure includes the combined use of electromyogram (EMG) and nerve conduction study (NCS). EMG is used to look for muscular disorders. NCS, which is also called electroneurogram, measures how well your nerves are controlling your muscles. The procedures are usually done together to check if your muscles and nerves are healthy. If the results of the tests  are abnormal, this may indicate disease or injury, such as a neuromuscular disease or peripheral nerve damage. Tell a health care provider about:  Any allergies you have.  All medicines you are taking, including vitamins, herbs, eye drops, creams, and over-the-counter medicines.  Any problems you or family members have had with anesthetic medicines.  Any blood disorders you have.  Any surgeries you have had.  Any medical conditions you have.  If you have a pacemaker.  Whether you are pregnant or may be pregnant. What are the risks? Generally, this is a safe procedure. However, problems may occur, including:  Infection where the electrodes were inserted.  Bleeding. What happens before the procedure? Medicines Ask your health care provider about:  Changing or stopping your  regular medicines. This is especially important if you are taking diabetes medicines or blood thinners.  Taking medicines such as aspirin and ibuprofen. These medicines can thin your blood. Do not take these medicines unless your health care provider tells you to take them.  Taking over-the-counter medicines, vitamins, herbs, and supplements. General instructions  Your health care provider may ask you to avoid: ? Beverages that have caffeine, such as coffee and tea. ? Any products that contain nicotine or tobacco. These products include cigarettes, e-cigarettes, and chewing tobacco. If you need help quitting, ask your health care provider.  Do not use lotions or creams on the same day that you will be having the procedure. What happens during the procedure? For EMG   Your health care provider will ask you to stay in a position so that he or she can access the muscle that will be studied. You may be standing, sitting, or lying down.  You may be given a medicine that numbs the area (local anesthetic).  A very thin needle that has an electrode will be inserted into your muscle.  Another small electrode  will be placed on your skin near the muscle.  Your health care provider will ask you to continue to remain still.  The electrodes will send a signal that tells about the electrical activity of your muscles. You may see this on a monitor or hear it in the room.  After your muscles have been studied at rest, your health care provider will ask you to contract or flex your muscles. The electrodes will send a signal that tells about the electrical activity of your muscles.  Your health care provider will remove the electrodes and the electrode needles when the procedure is finished. The procedure may vary among health care providers and hospitals. For NCS   An electrode that records your nerve activity (recording electrode) will be placed on your skin by the muscle that is being studied.  An electrode that is used as a reference (reference electrode) will be placed near the recording electrode.  A paste or gel will be applied to your skin between the recording electrode and the reference electrode.  Your nerve will be stimulated with a mild shock. Your health care provider will measure how much time it takes for your muscle to react.  Your health care provider will remove the electrodes and the gel when the procedure is finished. The procedure may vary among health care providers and hospitals. What happens after the procedure?  It is up to you to get the results of your procedure. Ask your health care provider, or the department that is doing the procedure, when your results will be ready.  Your health care provider may: ? Give you medicines for any pain. ? Monitor the insertion sites to make sure that bleeding stops. Summary  Electromyoneurogram is a test to check how well your muscles and nerves are working.  If the results of the tests are abnormal, this may indicate disease or injury.  This is a safe procedure. However, problems may occur, such as bleeding and infection.  Your  health care provider will do two tests to complete this procedure. One checks your muscles (EMG) and another checks your nerves (NCS).  It is up to you to get the results of your procedure. Ask your health care provider, or the department that is doing the procedure, when your results will be ready. This information is not intended to replace advice given to you  by your health care provider. Make sure you discuss any questions you have with your health care provider. Document Revised: 10/14/2017 Document Reviewed: 09/26/2017 Elsevier Patient Education  2020 [employer].

## 2019-11-03 NOTE — Telephone Encounter (Signed)
[employer] pending  

## 2019-11-03 NOTE — Telephone Encounter (Signed)
Evan Smith: [id number] (exp. 11/03/19 to 12/03/19) order sent to GI. They will reach out to the patient to schedule.

## 2019-11-05 ENCOUNTER — Other Ambulatory Visit: Payer: Medicare HMO

## 2019-11-06 ENCOUNTER — Ambulatory Visit
Admission: RE | Admit: 2019-11-06 | Discharge: 2019-11-06 | Disposition: A | Discharge status: Home / Self Care | Payer: Medicare HMO | Source: Ambulatory Visit | Attending: Neurology | Admitting: Neurology

## 2019-11-06 DIAGNOSIS — R202 Paresthesia of skin: Secondary | ICD-10-CM

## 2019-11-06 DIAGNOSIS — M4802 Spinal stenosis, cervical region: Secondary | ICD-10-CM | POA: Diagnosis not present

## 2019-11-06 DIAGNOSIS — M542 Cervicalgia: Secondary | ICD-10-CM | POA: Diagnosis not present

## 2019-11-06 DIAGNOSIS — R200 Anesthesia of skin: Secondary | ICD-10-CM

## 2019-11-06 DIAGNOSIS — M6281 Muscle weakness (generalized): Secondary | ICD-10-CM

## 2019-11-06 DIAGNOSIS — G8929 Other chronic pain: Secondary | ICD-10-CM

## 2019-11-06 DIAGNOSIS — M79601 Pain in right arm: Secondary | ICD-10-CM

## 2019-11-06 DIAGNOSIS — R29898 Other symptoms and signs involving the musculoskeletal system: Secondary | ICD-10-CM

## 2019-11-13 ENCOUNTER — Other Ambulatory Visit: Payer: Self-pay | Admitting: Nurse Practitioner

## 2019-11-13 DIAGNOSIS — M549 Dorsalgia, unspecified: Secondary | ICD-10-CM

## 2019-11-15 ENCOUNTER — Ambulatory Visit (INDEPENDENT_AMBULATORY_CARE_PROVIDER_SITE_OTHER): Payer: Medicare HMO | Admitting: Nurse Practitioner

## 2019-11-15 ENCOUNTER — Encounter: Payer: Self-pay | Admitting: Nurse Practitioner

## 2019-11-15 ENCOUNTER — Other Ambulatory Visit: Payer: Self-pay

## 2019-11-15 VITALS — BP 119/80 | HR 87 | Temp 97.6°F | Resp 20 | Ht 68.0 in | Wt 290.0 lb

## 2019-11-15 DIAGNOSIS — M5441 Lumbago with sciatica, right side: Secondary | ICD-10-CM | POA: Diagnosis not present

## 2019-11-15 DIAGNOSIS — M5442 Lumbago with sciatica, left side: Secondary | ICD-10-CM

## 2019-11-15 DIAGNOSIS — I10 Essential (primary) hypertension: Secondary | ICD-10-CM

## 2019-11-15 DIAGNOSIS — E782 Mixed hyperlipidemia: Secondary | ICD-10-CM

## 2019-11-15 DIAGNOSIS — G8929 Other chronic pain: Secondary | ICD-10-CM | POA: Insufficient documentation

## 2019-11-15 DIAGNOSIS — G629 Polyneuropathy, unspecified: Secondary | ICD-10-CM

## 2019-11-15 DIAGNOSIS — G4733 Obstructive sleep apnea (adult) (pediatric): Secondary | ICD-10-CM | POA: Diagnosis not present

## 2019-11-15 DIAGNOSIS — E119 Type 2 diabetes mellitus without complications: Secondary | ICD-10-CM | POA: Diagnosis not present

## 2019-11-15 DIAGNOSIS — E1142 Type 2 diabetes mellitus with diabetic polyneuropathy: Secondary | ICD-10-CM

## 2019-11-15 DIAGNOSIS — Z23 Encounter for immunization: Secondary | ICD-10-CM

## 2019-11-15 DIAGNOSIS — K2100 Gastro-esophageal reflux disease with esophagitis, without bleeding: Secondary | ICD-10-CM

## 2019-11-15 LAB — BAYER DCA HB A1C WAIVED: HB A1C (BAYER DCA - WAIVED): 6 % (ref <7.0)

## 2019-11-15 MED ORDER — OMEPRAZOLE 40 MG PO CPDR: DELAYED_RELEASE_CAPSULE | ORAL | 1 refills | Status: DC

## 2019-11-15 MED ORDER — METFORMIN HCL ER 500 MG PO TB24: 500.0000 mg | ORAL_TABLET | Freq: Every day | ORAL | 1 refills | Status: DC

## 2019-11-15 MED ORDER — SIMVASTATIN 40 MG PO TABS: 40.0000 mg | ORAL_TABLET | Freq: Every day | ORAL | 1 refills | Status: DC

## 2019-11-15 MED ORDER — LISINOPRIL-HYDROCHLOROTHIAZIDE 20-25 MG PO TABS: 1.0000 | ORAL_TABLET | Freq: Every day | ORAL | 1 refills | Status: DC

## 2019-11-15 MED ORDER — AMLODIPINE BESYLATE 5 MG PO TABS: 5.0000 mg | ORAL_TABLET | Freq: Every day | ORAL | 1 refills | Status: DC

## 2019-11-15 MED ORDER — GABAPENTIN 300 MG PO CAPS: ORAL_CAPSULE | ORAL | 0 refills | Status: DC

## 2019-11-15 NOTE — Patient Instructions (Signed)
Diabetes Mellitus and Foot Care Foot care is an important part of your health, especially when you have diabetes. Diabetes may cause you to have problems because of poor blood flow (circulation) to your feet and legs, which can cause your skin to:  Become thinner and drier.  Break more easily.  Heal more slowly.  Peel and crack. You may also have nerve damage (neuropathy) in your legs and feet, causing decreased feeling in them. This means that you may not notice minor injuries to your feet that could lead to more serious problems. Noticing and addressing any potential problems early is the best way to prevent future foot problems. How to care for your feet Foot hygiene  Wash your feet daily with warm water and mild soap. Do not use hot water. Then, pat your feet and the areas between your toes until they are completely dry. Do not soak your feet as this can dry your skin.  Trim your toenails straight across. Do not dig under them or around the cuticle. File the edges of your nails with an emery board or nail file.  Apply a moisturizing lotion or petroleum jelly to the skin on your feet and to dry, brittle toenails. Use lotion that does not contain alcohol and is unscented. Do not apply lotion between your toes. Shoes and socks  Wear clean socks or stockings every day. Make sure they are not too tight. Do not wear knee-high stockings since they may decrease blood flow to your legs.  Wear shoes that fit properly and have enough cushioning. Always look in your shoes before you put them on to be sure there are no objects inside.  To break in new shoes, wear them for just a few hours a day. This prevents injuries on your feet. Wounds, scrapes, corns, and calluses  Check your feet daily for blisters, cuts, bruises, sores, and redness. If you cannot see the bottom of your feet, use a mirror or ask someone for help.  Do not cut corns or calluses or try to remove them with medicine.  If you  find a minor scrape, cut, or break in the skin on your feet, keep it and the skin around it clean and dry. You may clean these areas with mild soap and water. Do not clean the area with peroxide, alcohol, or iodine.  If you have a wound, scrape, corn, or callus on your foot, look at it several times a day to make sure it is healing and not infected. Check for: ? Redness, swelling, or pain. ? Fluid or blood. ? Warmth. ? Pus or a bad smell. General instructions  Do not cross your legs. This may decrease blood flow to your feet.  Do not use heating pads or hot water bottles on your feet. They may burn your skin. If you have lost feeling in your feet or legs, you may not know this is happening until it is too late.  Protect your feet from hot and cold by wearing shoes, such as at the beach or on hot pavement.  Schedule a complete foot exam at least once a year (annually) or more often if you have foot problems. If you have foot problems, report any cuts, sores, or bruises to your health care provider immediately. Contact a health care provider if:  You have a medical condition that increases your risk of infection and you have any cuts, sores, or bruises on your feet.  You have an injury that is not   healing.  You have redness on your legs or feet.  You feel burning or tingling in your legs or feet.  You have pain or cramps in your legs and feet.  Your legs or feet are numb.  Your feet always feel cold.  You have pain around a toenail. Get help right away if:  You have a wound, scrape, corn, or callus on your foot and: ? You have pain, swelling, or redness that gets worse. ? You have fluid or blood coming from the wound, scrape, corn, or callus. ? Your wound, scrape, corn, or callus feels warm to the touch. ? You have pus or a bad smell coming from the wound, scrape, corn, or callus. ? You have a fever. ? You have a red line going up your leg. Summary  Check your feet every day  for cuts, sores, red spots, swelling, and blisters.  Moisturize feet and legs daily.  Wear shoes that fit properly and have enough cushioning.  If you have foot problems, report any cuts, sores, or bruises to your health care provider immediately.  Schedule a complete foot exam at least once a year (annually) or more often if you have foot problems. This information is not intended to replace advice given to you by your health care provider. Make sure you discuss any questions you have with your health care provider. Document Revised: 10/21/2018 Document Reviewed: 03/01/2016 Elsevier Patient Education  2020 Elsevier Inc.  

## 2019-11-15 NOTE — Progress Notes (Signed)
Subjective:    Patient ID: Evan Smith, male    DOB: [birth year], 64 y.o.   MRN: [id number]   Chief Complaint: Medical Management of Chronic Issues    HPI:  1. Essential hypertension No c/o chest pain, sob or headache. Does not check blood pressure at home. BP Readings from Last 3 Encounters:  11/03/19 131/75  08/19/19 111/73  05/17/19 114/75     2. Type 2 diabetes mellitus with diabetic polyneuropathy, without long-term current use of insulin (HCC) He does not check his blood sugars at home. Denies feeling like blood sugar is low. Lab Results  Component Value Date   HGBA1C 5.6 08/19/2019    3. Mixed hyperlipidemia Does not watch diet and does little to no exercise. Lab Results  Component Value Date   CHOL 142 08/19/2019   HDL 42 08/19/2019   LDLCALC 78 08/19/2019   TRIG 122 08/19/2019   CHOLHDL 3.4 08/19/2019     4. Obstructive sleep apnea syndrome Wears CPAP nightly. Feels rested in mornings.  5. Neuropathy Has numbness and burning in bil feet. neurontin helps  6. Severe obesity (BMI >= 40) (HCC) No recent weight changes Wt Readings from Last 3 Encounters:  11/15/19 290 lb (131.5 kg)  11/03/19 292 lb 12.8 oz (132.8 kg)  08/19/19 288 lb (130.6 kg)   BMI Readings from Last 3 Encounters:  11/15/19 44.09 kg/m  11/03/19 44.52 kg/m  08/19/19 43.79 kg/m       Outpatient Encounter Medications as of 11/15/2019  Medication Sig  . Alcohol Swabs (B-D SINGLE USE SWABS REGULAR) PADS Test BS daily Dx E11.9  . amLODipine (NORVASC) 5 MG tablet TAKE 1 TABLET EVERY DAY  . aspirin 81 MG tablet Take 81 mg by mouth daily.  . Blood Glucose Calibration (TRUE METRIX LEVEL 1) Low SOLN Test BS daily Dx E11.9  . Blood Glucose Monitoring Suppl (TRUE METRIX AIR GLUCOSE METER) w/Device KIT 1 Device by Does not apply route daily. Dx E11.9  . Cholecalciferol (VITAMIN D3) 1000 UNITS CAPS Take 1 capsule by mouth daily.   . cyclobenzaprine (FLEXERIL) 10 MG tablet Take 1  tablet by mouth three times daily as needed for muscle spasm  . fluticasone (FLONASE) 50 MCG/ACT nasal spray Place 2 sprays into both nostrils daily.  Marland Kitchen gabapentin (NEURONTIN) 300 MG capsule TAKE ONE CAPSULE BY MOUTH ONCE DAILY AT BEDTIME  . glucose blood (TRUE METRIX BLOOD GLUCOSE TEST) test strip Test BS daily Dx E11.9  . ibuprofen (ADVIL,MOTRIN) 200 MG tablet Take 200 mg by mouth 2 (two) times daily as needed.  Marland Kitchen lisinopril-hydrochlorothiazide (ZESTORETIC) 20-25 MG tablet TAKE 1 TABLET EVERY DAY  . loratadine (CLARITIN) 10 MG tablet TAKE 1 TABLET EVERY DAY  . metFORMIN (GLUCOPHAGE-XR) 500 MG 24 hr tablet TAKE 1 TABLET WITH BREAKFAST  . naproxen (NAPROSYN) 500 MG tablet Take 1 tablet (500 mg total) by mouth 2 (two) times daily with a meal.  . omeprazole (PRILOSEC) 40 MG capsule TAKE 1 CAPSULE EVERY MORNING  . Potassium Gluconate 80 MG TABS Take 99 mg by mouth.  . sildenafil (REVATIO) 20 MG tablet 1-2 tablets daily prn  . simvastatin (ZOCOR) 40 MG tablet Take 1 tablet (40 mg total) by mouth at bedtime.  . TRUEplus Lancets 33G MISC Test BS daily Dx E11.9     Past Surgical History:  Procedure Laterality Date  . CERVICAL FUSION  1990's  . EVALUATION UNDER ANESTHESIA WITH ANAL FISTULECTOMY N/A 08/03/2012   Procedure: EXAM UNDER ANESTHESIA WITH ANAL FISTULECTOMY ;  Surgeon: Leighton Ruff, MD;  Location: [facility];  Service: General;  Laterality: N/A;  . INCISION AND DRAINAGE PERIRECTAL ABSCESS  02/13/2012   Procedure: IRRIGATION AND DEBRIDEMENT PERIRECTAL ABSCESS;  Surgeon: Earnstine Regal, MD;  Location: [facility];  Service: General;  Laterality: N/A;  . LUMBAR FUSION  2010   has had 2 low back surgeries   . RIGHT HYDROCELECTOMY  07-28-2001  . SHOULDER ARTHROSCOPY WITH ROTATOR CUFF REPAIR Left 3's    Family History  Problem Relation Age of Onset  . Brain cancer Mother        tumor  . Diabetes Mother   . Kidney Stones Father   . Diabetes Sister   . Heart disease Sister     . GI problems Sister   . Cancer Brother        prostate  . Cancer Brother        lymphoma   . Heart disease Brother   . Blindness Son   . Arthritis Son        trouble with legs  . Heart disease Paternal Uncle   . Arthritis Son        shoulder and back     New complaints: Has had neck pain but is some better  Social history: Lives with his sister and mom  Controlled substance contract: n/a    Review of Systems  Constitutional: Negative for diaphoresis.  Eyes: Negative for pain.  Respiratory: Negative for shortness of breath.   Cardiovascular: Negative for chest pain, palpitations and leg swelling.  Gastrointestinal: Negative for abdominal pain.  Endocrine: Negative for polydipsia.  Skin: Negative for rash.  Neurological: Negative for dizziness, weakness and headaches.  Hematological: Does not bruise/bleed easily.  All other systems reviewed and are negative.      Objective:   Physical Exam Vitals and nursing note reviewed.  Constitutional:      Appearance: Normal appearance. He is well-developed.  HENT:     Head: Normocephalic.     Nose: Nose normal.  Eyes:     Pupils: Pupils are equal, round, and reactive to light.  Neck:     Thyroid: No thyroid mass or thyromegaly.     Vascular: No carotid bruit or JVD.     Trachea: Phonation normal.  Cardiovascular:     Rate and Rhythm: Normal rate and regular rhythm.  Pulmonary:     Effort: Pulmonary effort is normal. No respiratory distress.     Breath sounds: Normal breath sounds.  Abdominal:     General: Bowel sounds are normal.     Palpations: Abdomen is soft.     Tenderness: There is no abdominal tenderness.  Musculoskeletal:        General: Normal range of motion.     Cervical back: Normal range of motion and neck supple.     Comments: Rises slowly from sitting to standing. Had trouble getting on the exam table.  Lymphadenopathy:     Cervical: No cervical adenopathy.  Skin:    General: Skin is warm and  dry.  Neurological:     Mental Status: He is alert and oriented to person, place, and time.  Psychiatric:        Behavior: Behavior normal.        Thought Content: Thought content normal.        Judgment: Judgment normal.    BP 119/80   Pulse 87   Temp 97.6 F (36.4 C) (Temporal)   Resp 20  Ht '5\' 8"'  (1.727 m)   Wt 290 lb (131.5 kg)   SpO2 98%   BMI 44.09 kg/m   hgba1c 6.0%     Assessment & Plan:  MAL ASHER comes in today with chief complaint of Medical Management of Chronic Issues   Diagnosis and orders addressed:  1. Essential hypertension Low sodium diet - CBC with Differential/Platelet - CMP14+EGFR - amLODipine (NORVASC) 5 MG tablet; Take 1 tablet (5 mg total) by mouth daily.  Dispense: 90 tablet; Refill: 1 - lisinopril-hydrochlorothiazide (ZESTORETIC) 20-25 MG tablet; Take 1 tablet by mouth daily.  Dispense: 90 tablet; Refill: 1  2. Type 2 diabetes mellitus with diabetic polyneuropathy, without long-term current use of insulin (HCC) Continue to watch carbs in diet - Bayer DCA Hb A1c Waived - Microalbumin / creatinine urine ratio - metFORMIN (GLUCOPHAGE-XR) 500 MG 24 hr tablet; Take 1 tablet (500 mg total) by mouth daily with breakfast.  Dispense: 90 tablet; Refill: 1  3. Mixed hyperlipidemia Low fat diet - Lipid panel - simvastatin (ZOCOR) 40 MG tablet; Take 1 tablet (40 mg total) by mouth at bedtime.  Dispense: 90 tablet; Refill: 1  4. Obstructive sleep apnea syndrome Continue to wear cpap  6. Neuropathy do not go barefooted Refuses to see podiatry - gabapentin (NEURONTIN) 300 MG capsule; TAKE ONE CAPSULE BY MOUTH ONCE DAILY AT BEDTIME  Dispense: 90 capsule; Refill: 0  7. Severe obesity (BMI >= 40) (HCC) Discussed diet and exercise for person with BMI >25 Will recheck weight in 3-6 months  8. Chronic midline low back pain with bilateral sciatica Rest Moist heat - For home use only DME Other see comment   9. Gastroesophageal reflux disease  with esophagitis Avoid spicy foods Do not eat 2 hours prior to bedtime - omeprazole (PRILOSEC) 40 MG capsule; TAKE 1 CAPSULE EVERY MORNING  Dispense: 90 capsule; Refill: 1   Labs pending Health Maintenance reviewed- pat will make appointment ofr eye exam Diet and exercise encouraged  Follow up plan: 3 months   Mary-Margaret Hassell Done, FNP

## 2019-11-16 LAB — CMP14+EGFR
ALT: 20 IU/L (ref 0–44)
AST: 25 IU/L (ref 0–40)
Albumin/Globulin Ratio: 1.7 (ref 1.2–2.2)
Albumin: 4.3 g/dL (ref 3.8–4.8)
Alkaline Phosphatase: 64 IU/L (ref 44–121)
BUN/Creatinine Ratio: 13 (ref 10–24)
BUN: 19 mg/dL (ref 8–27)
Bilirubin Total: 0.4 mg/dL (ref 0.0–1.2)
CO2: 22 mmol/L (ref 20–29)
Calcium: 9.6 mg/dL (ref 8.6–10.2)
Chloride: 101 mmol/L (ref 96–106)
Creatinine, Ser: 1.5 mg/dL — ABNORMAL HIGH (ref 0.76–1.27)
GFR calc Af Amer: 56 mL/min/{1.73_m2} — ABNORMAL LOW (ref >59)
GFR calc non Af Amer: 48 mL/min/{1.73_m2} — ABNORMAL LOW (ref >59)
Globulin, Total: 2.5 g/dL (ref 1.5–4.5)
Glucose: 81 mg/dL (ref 65–99)
Potassium: 4.9 mmol/L (ref 3.5–5.2)
Sodium: 138 mmol/L (ref 134–144)
Total Protein: 6.8 g/dL (ref 6.0–8.5)

## 2019-11-16 LAB — CBC WITH DIFFERENTIAL/PLATELET
Basophils Absolute: 0 10*3/uL (ref 0.0–0.2)
Basos: 1 %
EOS (ABSOLUTE): 0.1 10*3/uL (ref 0.0–0.4)
Eos: 2 %
Hematocrit: 37.9 % (ref 37.5–51.0)
Hemoglobin: 13.4 g/dL (ref 13.0–17.7)
Immature Grans (Abs): 0 10*3/uL (ref 0.0–0.1)
Immature Granulocytes: 0 %
Lymphocytes Absolute: 1.4 10*3/uL (ref 0.7–3.1)
Lymphs: 36 %
MCH: 33.6 pg — ABNORMAL HIGH (ref 26.6–33.0)
MCHC: 35.4 g/dL (ref 31.5–35.7)
MCV: 95 fL (ref 79–97)
Monocytes Absolute: 0.5 10*3/uL (ref 0.1–0.9)
Monocytes: 12 %
Neutrophils Absolute: 1.9 10*3/uL (ref 1.4–7.0)
Neutrophils: 49 %
Platelets: 176 10*3/uL (ref 150–450)
RBC: 3.99 x10E6/uL — ABNORMAL LOW (ref 4.14–5.80)
RDW: 13.2 % (ref 11.6–15.4)
WBC: 3.8 10*3/uL (ref 3.4–10.8)

## 2019-11-16 LAB — LIPID PANEL
Chol/HDL Ratio: 3.3 ratio (ref 0.0–5.0)
Cholesterol, Total: 143 mg/dL (ref 100–199)
HDL: 43 mg/dL (ref >39)
LDL Chol Calc (NIH): 55 mg/dL (ref 0–99)
Triglycerides: 286 mg/dL — ABNORMAL HIGH (ref 0–149)
VLDL Cholesterol Cal: 45 mg/dL — ABNORMAL HIGH (ref 5–40)

## 2019-11-23 DIAGNOSIS — G4733 Obstructive sleep apnea (adult) (pediatric): Secondary | ICD-10-CM | POA: Diagnosis not present

## 2019-11-24 ENCOUNTER — Other Ambulatory Visit: Payer: Self-pay | Admitting: Nurse Practitioner

## 2019-11-29 ENCOUNTER — Encounter: Payer: Medicare HMO | Admitting: Neurology

## 2019-12-06 ENCOUNTER — Other Ambulatory Visit: Payer: Self-pay | Admitting: Nurse Practitioner

## 2019-12-06 DIAGNOSIS — J309 Allergic rhinitis, unspecified: Secondary | ICD-10-CM

## 2019-12-13 ENCOUNTER — Ambulatory Visit (INDEPENDENT_AMBULATORY_CARE_PROVIDER_SITE_OTHER): Payer: Medicare HMO | Admitting: Neurology

## 2019-12-13 ENCOUNTER — Ambulatory Visit: Payer: Medicare HMO | Admitting: Neurology

## 2019-12-13 ENCOUNTER — Encounter: Payer: Medicare HMO | Admitting: Neurology

## 2019-12-13 DIAGNOSIS — M5412 Radiculopathy, cervical region: Secondary | ICD-10-CM

## 2019-12-13 DIAGNOSIS — M79601 Pain in right arm: Secondary | ICD-10-CM

## 2019-12-13 DIAGNOSIS — M542 Cervicalgia: Secondary | ICD-10-CM

## 2019-12-13 DIAGNOSIS — Z0289 Encounter for other administrative examinations: Secondary | ICD-10-CM

## 2019-12-13 DIAGNOSIS — R202 Paresthesia of skin: Secondary | ICD-10-CM

## 2019-12-13 DIAGNOSIS — G8929 Other chronic pain: Secondary | ICD-10-CM

## 2019-12-13 DIAGNOSIS — R29898 Other symptoms and signs involving the musculoskeletal system: Secondary | ICD-10-CM

## 2019-12-13 DIAGNOSIS — M6281 Muscle weakness (generalized): Secondary | ICD-10-CM

## 2019-12-13 DIAGNOSIS — R200 Anesthesia of skin: Secondary | ICD-10-CM

## 2019-12-13 NOTE — Progress Notes (Signed)
      Full Name: Evan Smith Gender: Male MRN #: [id number] Date of Birth: 11/24/1955    Visit Date: 12/13/2019 12:28 Age: 64 Years Examining Physician: Larinda Herter, MD  Referring Physician:  Dax Varkey, MD  History: Patient with arm pain Summary: EMG/NCS was performed on the bilateral upper extremities    The right median/ulnar (palm) comparison nerve showed prolonged distal peak latency (Median Palm, 2.5 ms, N<2.2) and abnormal peak latency difference (Median Palm-Ulnar Palm, 0.8 ms, N<0.4) with a relative median delay.  All remaining nerves (as indicated in the following tables) were within normal limits.  The left and right deltoid muscles and the left biceps muscle all  showed increased spontaneous activity, prolonged motor unit duration, polyphasic motor units and diminished motor unit recruitment. All remaining muscles (as indicated in the following tables) were within normal limits.      Conclusion: 1. There is acute on chronic bilateral C5/C6 cervical radiculopathy. 2. There is concomitant mild right Carpal Tunnel Syndrome.    Anadelia Kintz M.D.  [facility] [address], [address] [place], [place] [address] Tel: [phone] Fax: [phone]  Verbal informed consent was obtained from the patient, patient was informed of potential risk of procedure, including bruising, bleeding, hematoma formation, infection, muscle weakness, muscle pain, numbness, among others.        MNC    Nerve / Sites Muscle Latency Ref. Amplitude Ref. Rel Amp Segments Distance Velocity Ref. Area    ms ms mV mV %  cm m/s m/s mVms  R Median - APB     Wrist APB 4.1 ?4.4 14.3 ?4.0 100 Wrist - APB 7   39.4     Upper arm APB 8.3  13.5  94.2 Upper arm - Wrist 24 58 ?49 38.4  L Median - APB     Wrist APB 3.9 ?4.4 13.2 ?4.0 100 Wrist - APB 7   38.2     Upper arm APB 8.1  11.7  89.2 Upper arm - Wrist 24 57 ?49 37.0  R Ulnar - ADM     Wrist ADM 2.7 ?3.3 14.1 ?6.0 100 Wrist - ADM 7    41.3     B.Elbow ADM 6.6  14.5  103 B.Elbow - Wrist 22 56 ?49 43.7     A.Elbow ADM 8.3  14.1  97 A.Elbow - B.Elbow 10 57 ?49 42.7  L Ulnar - ADM     Wrist ADM 2.9 ?3.3 13.0 ?6.0 100 Wrist - ADM 7   37.7     B.Elbow ADM 6.6  11.6  89 B.Elbow - Wrist 21 57 ?49 36.4     A.Elbow ADM 8.5  11.3  97.5 A.Elbow - B.Elbow 10 53 ?49 35.3             SNC    Nerve / Sites Rec. Site Peak Lat Ref.  Amp Ref. Segments Distance Peak Diff Ref.    ms ms V V  cm ms ms  R Median, Ulnar - Transcarpal comparison     Median Palm Wrist 2.5 ?2.2 43 ?35 Median Palm - Wrist 8       Ulnar Palm Wrist 1.8 ?2.2 12 ?12 Ulnar Palm - Wrist 8          Median Palm - Ulnar Palm  0.8 ?0.4  L Median, Ulnar - Transcarpal comparison     Median Palm Wrist 2.3 ?2.2 38 ?35 Median Palm - Wrist 8       Ulnar   Palm Wrist 1.9 ?2.2 13 ?12 Ulnar Palm - Wrist 8          Median Palm - Ulnar Palm  0.4 ?0.4  R Median - Orthodromic (Dig II, Mid palm)     Dig II Wrist 3.4 ?3.4 12 ?10 Dig II - Wrist 13    L Median - Orthodromic (Dig II, Mid palm)     Dig II Wrist 3.2 ?3.4 15 ?10 Dig II - Wrist 13    R Ulnar - Orthodromic, (Dig V, Mid palm)     Dig V Wrist 2.3 ?3.1 7 ?5 Dig V - Wrist 11    L Ulnar - Orthodromic, (Dig V, Mid palm)     Dig V Wrist 2.7 ?3.1 7 ?5 Dig V - Wrist 105                   F  Wave    Nerve F Lat Ref.   ms ms  R Ulnar - ADM 30.1 ?32.0  L Ulnar - ADM 29.8 ?32.0         EMG Summary Table    Spontaneous MUAP Recruitment  Muscle IA Fib PSW Fasc Other Amp Dur. Poly Pattern  L. Deltoid Normal None 2+ None _______ Normal Increased 2+ Reduced  R. Deltoid Normal None 1+ None _______ Normal Increased 2+ Reduced  L. Triceps brachii Normal None None None _______ Normal Normal Normal Normal  R. Triceps brachii Normal None None None _______ Normal Normal Normal Normal  L. Pronator teres Normal None None None _______ Normal Normal Normal Normal  R. Pronator teres Normal None None None _______ Normal Normal Normal Normal  L.  First dorsal interosseous Normal None None None _______ Normal Normal Normal Normal  R. First dorsal interosseous Normal None None None _______ Normal Normal Normal Normal  L. Opponens pollicis Normal None None None _______ Normal Normal Normal Normal  R. Opponens pollicis Normal None None None _______ Normal Normal Normal Normal  L. Cervical paraspinals (low) Normal None None None _______ Normal Normal Normal Normal  R. Cervical paraspinals (low) Normal None None None _______ Normal Normal Normal Normal  L. Biceps brachii Normal None 3+ None _______ Normal Increased 2+ Reduced  R. Biceps brachii Normal None None None _______ Normal Normal Normal Reduced

## 2019-12-13 NOTE — Patient Instructions (Signed)
Cervical Radiculopathy ° °Cervical radiculopathy happens when a nerve in the neck (a cervical nerve) is pinched or bruised. This condition can happen because of an injury to the cervical spine (vertebrae) in the neck, or as part of the normal aging process. Pressure on the cervical nerves can cause pain or numbness that travels from the neck all the way down into the arm and fingers. Usually, this condition gets better with rest. Treatment may be needed if the condition does not improve. °What are the causes? °This condition may be caused by: °· A neck injury. °· A bulging (herniated) disk. °· Muscle spasms. °· Muscle tightness in the neck because of overuse. °· Arthritis. °· Breakdown or degeneration in the bones and joints of the spine (spondylosis) due to aging. °· Bone spurs that may develop near the cervical nerves. °What are the signs or symptoms? °Symptoms of this condition include: °· Pain. The pain may travel from the neck to the arm and hand. The pain can be severe or irritating. It may be worse when you move your neck. °· Numbness or tingling in your arm or hand. °· Weakness in the affected arm and hand, in severe cases. °How is this diagnosed? °This condition may be diagnosed based on your symptoms, your medical history, and a physical exam. You may also have tests, including: °· X-rays. °· A CT scan. °· An MRI. °· An electromyogram (EMG). °· Nerve conduction tests. °How is this treated? °In many cases, treatment is not needed for this condition. With rest, the condition usually gets better over time. If treatment is needed, options may include: °· Wearing a soft neck collar (cervical collar) for short periods of time, as told by your health care provider. °· Doing physical therapy to strengthen your neck muscles. °· Taking medicines, such as NSAIDs or oral corticosteroids. °· Having spinal injections, in severe cases. °· Having surgery. This may be needed if other treatments do not help. Different types  of surgery may be done depending on the cause of this condition. °Follow these instructions at home: °If you have a cervical collar: °· Wear it as told by your health care provider. Remove it only as told by your health care provider. °· Ask your health care provider if you can remove the collar for cleaning and bathing. If you are allowed to remove the collar for cleaning or bathing: °? Follow instructions from your health care provider about how to remove the collar safely. °? Clean the collar by wiping it with mild soap and water and drying it completely. °? Take out any removable pads in the collar every 1-2 days, and wash them by hand with soap and water. Let them air-dry completely before you put them back in the collar. °? Check your skin under the collar for irritation or sores. If you see any, tell your health care provider. °Managing pain ° °  ° °· Take over-the-counter and prescription medicines only as told by your health care provider. °· If directed, put ice on the affected area. °? If you have a soft neck collar, remove it as told by your health care provider. °? Put ice in a plastic bag. °? Place a towel between your skin and the bag. °? Leave the ice on for 20 minutes, 2-3 times a day. °· If applying ice does not help, you can try using heat. Use the heat source that your health care provider recommends, such as a moist heat pack or a heating pad. °?   Place a towel between your skin and the heat source. °? Leave the heat on for 20-30 minutes. °? Remove the heat if your skin turns bright red. This is especially important if you are unable to feel pain, heat, or cold. You may have a greater risk of getting burned. °· Try a gentle neck and shoulder massage to help relieve symptoms. °Activity °· Rest as needed. °· Return to your normal activities as told by your health care provider. Ask your health care provider what activities are safe for you. °· Do stretching and strengthening exercises as told by  your health care provider or physical therapist. °· Do not lift anything that is heavier than 10 lb (4.5 kg) until your health care provider tells you that it is safe. °General instructions °· Use a flat pillow when you sleep. °· Do not drive while wearing a cervical collar. If you do not have a cervical collar, ask your health care provider if it is safe to drive while your neck heals. °· Ask your health care provider if the medicine prescribed to you requires you to avoid driving or using heavy machinery. °· Do not use any products that contain nicotine or tobacco, such as cigarettes, e-cigarettes, and chewing tobacco. These can delay healing. If you need help quitting, ask your health care provider. °· Keep all follow-up visits as told by your health care provider. This is important. °Contact a health care provider if: °· Your condition does not improve with treatment. °Get help right away if: °· Your pain gets much worse and cannot be controlled with medicines. °· You have weakness or numbness in your hand, arm, face, or leg. °· You have a high fever. °· You have a stiff, rigid neck. °· You lose control of your bowels or your bladder (have incontinence). °· You have trouble with walking, balance, or speaking. °Summary °· Cervical radiculopathy happens when a nerve in the neck is pinched or bruised. °· A nerve can get pinched from a bulging disk, arthritis, muscle spasms, or an injury to the neck. °· Symptoms include pain, tingling, or numbness radiating from the neck into the arm or hand. Weakness can also occur in severe cases. °· Treatment may include rest, wearing a cervical collar, and physical therapy. Medicines may be prescribed to help with pain. In severe cases, injections or surgery may be needed. °This information is not intended to replace advice given to you by your health care provider. Make sure you discuss any questions you have with your health care provider. °Document Revised: 12/19/2017  Document Reviewed: 12/19/2017 °Elsevier Patient Education © 2020 [employer] Inc[employer] ° °

## 2019-12-13 NOTE — Progress Notes (Signed)
See procedure note.

## 2019-12-14 ENCOUNTER — Other Ambulatory Visit: Payer: Self-pay | Admitting: Neurology

## 2019-12-14 DIAGNOSIS — M5412 Radiculopathy, cervical region: Secondary | ICD-10-CM

## 2019-12-14 NOTE — Procedures (Signed)
Full Name: Evan Smith Gender: Male MRN #: [id number] Date of Birth: 12-26-1955    Visit Date: 12/13/2019 12:28 Age: 64 Years Examining Physician: Naomie Dean, MD  Referring Physician:  Ramond Marrow, MD  History: Patient with arm pain Summary: EMG/NCS was performed on the bilateral upper extremities    The right median/ulnar (palm) comparison nerve showed prolonged distal peak latency (Median Palm, 2.5 ms, N<2.2) and abnormal peak latency difference (Median Palm-Ulnar Palm, 0.8 ms, N<0.4) with a relative median delay.  All remaining nerves (as indicated in the following tables) were within normal limits.  The left and right deltoid muscles and the left biceps muscle all  showed increased spontaneous activity, prolonged motor unit duration, polyphasic motor units and diminished motor unit recruitment. All remaining muscles (as indicated in the following tables) were within normal limits.      Conclusion: 1. There is acute on chronic bilateral C5/C6 cervical radiculopathy. 2. There is concomitant mild right Carpal Tunnel Syndrome.    Naomie Dean M.D.  [facility] [address], [address] [place], [place] [address] Tel: [phone] Fax: [phone]  Verbal informed consent was obtained from the patient, patient was informed of potential risk of procedure, including bruising, bleeding, hematoma formation, infection, muscle weakness, muscle pain, numbness, among others.        MNC    Nerve / Sites Muscle Latency Ref. Amplitude Ref. Rel Amp Segments Distance Velocity Ref. Area    ms ms mV mV %  cm m/s m/s mVms  R Median - APB     Wrist APB 4.1 ?4.4 14.3 ?4.0 100 Wrist - APB 7   39.4     Upper arm APB 8.3  13.5  94.2 Upper arm - Wrist 24 58 ?49 38.4  L Median - APB     Wrist APB 3.9 ?4.4 13.2 ?4.0 100 Wrist - APB 7   38.2     Upper arm APB 8.1  11.7  89.2 Upper arm - Wrist 24 57 ?49 37.0  R Ulnar - ADM     Wrist ADM 2.7 ?3.3 14.1 ?6.0 100 Wrist - ADM 7    41.3     B.Elbow ADM 6.6  14.5  103 B.Elbow - Wrist 22 56 ?49 43.7     A.Elbow ADM 8.3  14.1  97 A.Elbow - B.Elbow 10 57 ?49 42.7  L Ulnar - ADM     Wrist ADM 2.9 ?3.3 13.0 ?6.0 100 Wrist - ADM 7   37.7     B.Elbow ADM 6.6  11.6  89 B.Elbow - Wrist 21 57 ?49 36.4     A.Elbow ADM 8.5  11.3  97.5 A.Elbow - B.Elbow 10 53 ?49 35.3             SNC    Nerve / Sites Rec. Site Peak Lat Ref.  Amp Ref. Segments Distance Peak Diff Ref.    ms ms V V  cm ms ms  R Median, Ulnar - Transcarpal comparison     Median Palm Wrist 2.5 ?2.2 43 ?35 Median Palm - Wrist 8       Ulnar Palm Wrist 1.8 ?2.2 12 ?12 Ulnar Palm - Wrist 8          Median Palm - Ulnar Palm  0.8 ?0.4  L Median, Ulnar - Transcarpal comparison     Median Palm Wrist 2.3 ?2.2 38 ?35 Median Palm - Wrist 8       Ulnar  Palm Wrist 1.9 ?2.2 13 ?12 Ulnar Palm - Wrist 8          Median Palm - Ulnar Palm  0.4 ?0.4  R Median - Orthodromic (Dig II, Mid palm)     Dig II Wrist 3.4 ?3.4 12 ?10 Dig II - Wrist 13    L Median - Orthodromic (Dig II, Mid palm)     Dig II Wrist 3.2 ?3.4 15 ?10 Dig II - Wrist 13    R Ulnar - Orthodromic, (Dig V, Mid palm)     Dig V Wrist 2.3 ?3.1 7 ?5 Dig V - Wrist 11    L Ulnar - Orthodromic, (Dig V, Mid palm)     Dig V Wrist 2.7 ?3.1 7 ?5 Dig V - Wrist 105                   F  Wave    Nerve F Lat Ref.   ms ms  R Ulnar - ADM 30.1 ?32.0  L Ulnar - ADM 29.8 ?32.0         EMG Summary Table    Spontaneous MUAP Recruitment  Muscle IA Fib PSW Fasc Other Amp Dur. Poly Pattern  L. Deltoid Normal None 2+ None _______ Normal Increased 2+ Reduced  R. Deltoid Normal None 1+ None _______ Normal Increased 2+ Reduced  L. Triceps brachii Normal None None None _______ Normal Normal Normal Normal  R. Triceps brachii Normal None None None _______ Normal Normal Normal Normal  L. Pronator teres Normal None None None _______ Normal Normal Normal Normal  R. Pronator teres Normal None None None _______ Normal Normal Normal Normal  L.  First dorsal interosseous Normal None None None _______ Normal Normal Normal Normal  R. First dorsal interosseous Normal None None None _______ Normal Normal Normal Normal  L. Opponens pollicis Normal None None None _______ Normal Normal Normal Normal  R. Opponens pollicis Normal None None None _______ Normal Normal Normal Normal  L. Cervical paraspinals (low) Normal None None None _______ Normal Normal Normal Normal  R. Cervical paraspinals (low) Normal None None None _______ Normal Normal Normal Normal  L. Biceps brachii Normal None 3+ None _______ Normal Increased 2+ Reduced  R. Biceps brachii Normal None None None _______ Normal Normal Normal Reduced

## 2019-12-20 DIAGNOSIS — M4802 Spinal stenosis, cervical region: Secondary | ICD-10-CM | POA: Diagnosis not present

## 2019-12-20 DIAGNOSIS — G992 Myelopathy in diseases classified elsewhere: Secondary | ICD-10-CM | POA: Diagnosis not present

## 2019-12-21 ENCOUNTER — Other Ambulatory Visit: Payer: Self-pay | Admitting: Neurosurgery

## 2019-12-24 DIAGNOSIS — G4733 Obstructive sleep apnea (adult) (pediatric): Secondary | ICD-10-CM | POA: Diagnosis not present

## 2019-12-29 ENCOUNTER — Ambulatory Visit (INDEPENDENT_AMBULATORY_CARE_PROVIDER_SITE_OTHER): Payer: Medicare HMO | Admitting: Family

## 2019-12-29 ENCOUNTER — Other Ambulatory Visit: Payer: Medicare HMO

## 2019-12-29 ENCOUNTER — Encounter: Payer: Self-pay | Admitting: Family

## 2019-12-29 ENCOUNTER — Other Ambulatory Visit: Payer: Self-pay | Admitting: Internal Medicine

## 2019-12-29 DIAGNOSIS — J209 Acute bronchitis, unspecified: Secondary | ICD-10-CM | POA: Diagnosis not present

## 2019-12-29 DIAGNOSIS — Z20822 Contact with and (suspected) exposure to covid-19: Secondary | ICD-10-CM

## 2019-12-29 MED ORDER — PROMETHAZINE-DM 6.25-15 MG/5ML PO SYRP: 5.0000 mL | ORAL_SOLUTION | Freq: Three times a day (TID) | ORAL | 0 refills | Status: DC | PRN

## 2019-12-29 MED ORDER — PREDNISONE 10 MG (21) PO TBPK: ORAL_TABLET | ORAL | 0 refills | Status: DC

## 2019-12-29 MED ORDER — BENZONATATE 200 MG PO CAPS: 200.0000 mg | ORAL_CAPSULE | Freq: Three times a day (TID) | ORAL | 1 refills | Status: DC | PRN

## 2019-12-29 NOTE — Progress Notes (Signed)
Virtual Visit via telephone Note Due to COVID-19 pandemic this visit was conducted virtually. This visit type was conducted due to national recommendations for restrictions regarding the COVID-19 Pandemic (e.g. social distancing, sheltering in place) in an effort to limit this patient's exposure and mitigate transmission in our community. All issues noted in this document were discussed and addressed.  A physical exam was not performed with this format.  I connected with Evan Smith on 12/29/19 at 4:40 pm by telephone and verified that I am speaking with the correct person using two identifiers. Evan Smith is currently located at home and no one  is currently with him  during visit. The provider, Jannifer Rodney, FNP is located in their office at time of visit.  I discussed the limitations, risks, security and privacy concerns of performing an evaluation and management service by telephone and the availability of in person appointments. I also discussed with the patient that there may be a patient responsible charge related to this service. The patient expressed understanding and agreed to proceed.   History and Present Illness:  URI  This is a new problem. The current episode started in the past 7 days. The problem has been gradually worsening. There has been no fever. Associated symptoms include congestion, coughing, rhinorrhea, sneezing and a sore throat. Pertinent negatives include no ear pain, headaches or sinus pain. He has tried decongestant for the symptoms. The treatment provided mild relief.      Review of Systems  HENT: Positive for congestion, rhinorrhea, sneezing and sore throat. Negative for ear pain and sinus pain.   Respiratory: Positive for cough.   Neurological: Negative for headaches.  All other systems reviewed and are negative.    Observations/Objective: No SOB or distress noted, hoarse voice  Assessment and Plan: 1. Acute bronchitis, unspecified  organism Pt will come and get COVID tested today - Take meds as prescribed - Use a cool mist humidifier  -Use saline nose sprays frequently -Force fluids -For any cough or congestion  Use plain Mucinex- regular strength or max strength is fine -For fever or aces or pains- take tylenol or ibuprofen. -Throat lozenges if help -RTO if symptoms worsen or do not improve  - predniSONE (STERAPRED UNI-PAK 21 TAB) 10 MG (21) TBPK tablet; Use as directed  Dispense: 21 tablet; Refill: 0 - benzonatate (TESSALON) 200 MG capsule; Take 1 capsule (200 mg total) by mouth 3 (three) times daily as needed.  Dispense: 30 capsule; Refill: 1 - promethazine-dextromethorphan (PROMETHAZINE-DM) 6.25-15 MG/5ML syrup; Take 5 mLs by mouth 3 (three) times daily as needed for cough.  Dispense: 118 mL; Refill: 0     I discussed the assessment and treatment plan with the patient. The patient was provided an opportunity to ask questions and all were answered. The patient agreed with the plan and demonstrated an understanding of the instructions.   The patient was advised to call back or seek an in-person evaluation if the symptoms worsen or if the condition fails to improve as anticipated.  The above assessment and management plan was discussed with the patient. The patient verbalized understanding of and has agreed to the management plan. Patient is aware to call the clinic if symptoms persist or worsen. Patient is aware when to return to the clinic for a follow-up visit. Patient educated on when it is appropriate to go to the emergency department.   Time call ended:  4:51 pm  I provided 11 minutes of non-face-to-face time during this encounter.  Evelina Dun, FNP

## 2019-12-30 LAB — SARS-COV-2, NAA 2 DAY TAT

## 2019-12-30 LAB — SPECIMEN STATUS REPORT

## 2019-12-30 LAB — NOVEL CORONAVIRUS, NAA: SARS-CoV-2, NAA: NOT DETECTED

## 2020-01-12 ENCOUNTER — Other Ambulatory Visit: Payer: Self-pay

## 2020-01-12 LAB — HM DIABETES EYE EXAM

## 2020-01-19 DIAGNOSIS — M542 Cervicalgia: Secondary | ICD-10-CM | POA: Diagnosis not present

## 2020-01-19 DIAGNOSIS — J3489 Other specified disorders of nose and nasal sinuses: Secondary | ICD-10-CM | POA: Diagnosis not present

## 2020-01-23 DIAGNOSIS — G4733 Obstructive sleep apnea (adult) (pediatric): Secondary | ICD-10-CM | POA: Diagnosis not present

## 2020-01-24 ENCOUNTER — Other Ambulatory Visit: Payer: Self-pay | Admitting: Neurosurgery

## 2020-02-02 ENCOUNTER — Other Ambulatory Visit: Payer: Self-pay | Admitting: Family

## 2020-02-02 DIAGNOSIS — J209 Acute bronchitis, unspecified: Secondary | ICD-10-CM

## 2020-02-08 NOTE — Progress Notes (Signed)
[facility] [address] [place] - [address]  [place] [address] Phone: [phone] Fax: [phone]  [facility] - [place], [place] - [address] [place] [address] Phone: [phone] Fax: [phone]  [facility] [place], [place] - [address] [place] [address] Phone: [phone] Fax: [phone]      Your procedure is scheduled on January 4  Report to [facility] Main Entrance "A" at 0530 A.M., and check in at the Admitting office.  Call this number if you have problems the morning of surgery:  [phone]  Call [phone] if you have any questions prior to your surgery date Monday-Friday 8am-4pm    Remember:  Do not eat or drink after midnight the night before your surgery     Take these medicines the morning of surgery with A SIP OF WATER  amLODipine (NORVASC) cyclobenzaprine (FLEXERIL) if needed fluticasone (FLONASE if needed loratadine (CLARITIN) omeprazole (PRILOSEC)   As of today, STOP taking any Aspirin (unless otherwise instructed by your surgeon) Aleve, Naproxen, Ibuprofen, Motrin, Advil, Goody's, BC's, all herbal medications, fish oil, and all vitamins. celecoxib (CELEBREX)    WHAT DO I DO ABOUT MY DIABETES MEDICATION?   Marland Kitchen Do not take oral diabetes medicines (pills) the morning of surgery. metFORMIN (GLUCOPHAGE-XR)    HOW TO MANAGE YOUR DIABETES BEFORE AND AFTER SURGERY  Why is it important to control my blood sugar before and after surgery? . Improving blood sugar levels before and after surgery helps healing and can limit problems. . A way of improving blood sugar control is eating a healthy diet by: o  Eating less sugar and carbohydrates o  Increasing activity/exercise o  Talking with your doctor about reaching your blood sugar goals . High blood sugars (greater than 180 mg/dL) can raise your risk of infections and slow your  recovery, so you will need to focus on controlling your diabetes during the weeks before surgery. . Make sure that the doctor who takes care of your diabetes knows about your planned surgery including the date and location.  How do I manage my blood sugar before surgery? . Check your blood sugar at least 4 times a day, starting 2 days before surgery, to make sure that the level is not too high or low. . Check your blood sugar the morning of your surgery when you wake up and every 2 hours until you get to the Short Stay unit. o If your blood sugar is less than 70 mg/dL, you will need to treat for low blood sugar: - Do not take insulin. - Treat a low blood sugar (less than 70 mg/dL) with  cup of clear juice (cranberry or apple), 4 glucose tablets, OR glucose gel. - Recheck blood sugar in 15 minutes after treatment (to make sure it is greater than 70 mg/dL). If your blood sugar is not greater than 70 mg/dL on recheck, call [phone] for further instructions. . Report your blood sugar to the short stay nurse when you get to Short Stay.  . If you are admitted to the hospital after surgery: o Your blood sugar will be checked by the staff and you will probably be given insulin after surgery (instead of oral diabetes medicines) to make sure you have good blood sugar levels. o The goal for blood sugar control after surgery is 80-180 mg/dL.  Do not wear jewelry            Do not wear lotions, powders, colognes, or deodorant.            Men may shave face and neck.            Do not bring valuables to the hospital.            [facility] is not responsible for any belongings or valuables.  Do NOT Smoke (Tobacco/Vaping) or drink Alcohol 24 hours prior to your procedure If you use a CPAP at night, you may bring all equipment for your overnight stay.   Contacts, glasses, dentures or bridgework may not be worn into surgery.      For patients admitted to the hospital, discharge  time will be determined by your treatment team.   Patients discharged the day of surgery will not be allowed to drive home, and someone needs to stay with them for 24 hours.    Special instructions:   [facility]- Preparing For Surgery  Before surgery, you can play an important role. Because skin is not sterile, your skin needs to be as free of germs as possible. You can reduce the number of germs on your skin by washing with CHG (chlorahexidine gluconate) Soap before surgery.  CHG is an antiseptic cleaner which kills germs and bonds with the skin to continue killing germs even after washing.    Oral Hygiene is also important to reduce your risk of infection.  Remember - BRUSH YOUR TEETH THE MORNING OF SURGERY WITH YOUR REGULAR TOOTHPASTE  Please do not use if you have an allergy to CHG or antibacterial soaps. If your skin becomes reddened/irritated stop using the CHG.  Do not shave (including legs and underarms) for at least 48 hours prior to first CHG shower. It is OK to shave your face.  Please follow these instructions carefully.   1. Shower the NIGHT BEFORE SURGERY and the MORNING OF SURGERY with CHG Soap.   2. If you chose to wash your hair, wash your hair first as usual with your normal shampoo.  3. After you shampoo, rinse your hair and body thoroughly to remove the shampoo.  4. Use CHG as you would any other liquid soap. You can apply CHG directly to the skin and wash gently with a scrungie or a clean washcloth.   5. Apply the CHG Soap to your body ONLY FROM THE NECK DOWN.  Do not use on open wounds or open sores. Avoid contact with your eyes, ears, mouth and genitals (private parts). Wash Face and genitals (private parts)  with your normal soap.   6. Wash thoroughly, paying special attention to the area where your surgery will be performed.  7. Thoroughly rinse your body with warm water from the neck down.  8. DO NOT shower/wash with your normal soap after using and rinsing  off the CHG Soap.  9. Pat yourself dry with a CLEAN TOWEL.  10. Wear CLEAN PAJAMAS to bed the night before surgery  11. Place CLEAN SHEETS on your bed the night of your first shower and DO NOT SLEEP WITH PETS.   Day of Surgery: Wear Clean/Comfortable clothing the morning of surgery Do not apply any deodorants/lotions.   Remember to brush your teeth WITH YOUR REGULAR TOOTHPASTE.   Please read over the following fact sheets that you were given.

## 2020-02-09 ENCOUNTER — Ambulatory Visit (INDEPENDENT_AMBULATORY_CARE_PROVIDER_SITE_OTHER): Payer: Medicare HMO | Admitting: Nurse Practitioner

## 2020-02-09 ENCOUNTER — Other Ambulatory Visit: Payer: Self-pay

## 2020-02-09 ENCOUNTER — Encounter (HOSPITAL_COMMUNITY): Payer: Self-pay

## 2020-02-09 ENCOUNTER — Encounter (HOSPITAL_COMMUNITY)
Admission: RE | Admit: 2020-02-09 | Discharge: 2020-02-09 | Disposition: A | Discharge status: Home / Self Care | Payer: Medicare HMO | Source: Ambulatory Visit | Attending: Neurosurgery | Admitting: Neurosurgery

## 2020-02-09 DIAGNOSIS — J069 Acute upper respiratory infection, unspecified: Secondary | ICD-10-CM | POA: Diagnosis not present

## 2020-02-09 DIAGNOSIS — Z01812 Encounter for preprocedural laboratory examination: Secondary | ICD-10-CM | POA: Diagnosis not present

## 2020-02-09 DIAGNOSIS — J209 Acute bronchitis, unspecified: Secondary | ICD-10-CM

## 2020-02-09 HISTORY — DX: Dyspnea, unspecified: R06.00

## 2020-02-09 LAB — BASIC METABOLIC PANEL
Anion gap: 12 (ref 5–15)
BUN: 25 mg/dL — ABNORMAL HIGH (ref 8–23)
CO2: 22 mmol/L (ref 22–32)
Calcium: 9.2 mg/dL (ref 8.9–10.3)
Chloride: 102 mmol/L (ref 98–111)
Creatinine, Ser: 1.32 mg/dL — ABNORMAL HIGH (ref 0.61–1.24)
GFR, Estimated: 60 mL/min — ABNORMAL LOW (ref >60)
Glucose, Bld: 80 mg/dL (ref 70–99)
Potassium: 3.6 mmol/L (ref 3.5–5.1)
Sodium: 136 mmol/L (ref 135–145)

## 2020-02-09 LAB — TYPE AND SCREEN
ABO/RH(D): O POS
Antibody Screen: NEGATIVE

## 2020-02-09 LAB — GLUCOSE, CAPILLARY: Glucose-Capillary: 89 mg/dL (ref 70–99)

## 2020-02-09 LAB — CBC
HCT: 40.1 % (ref 39.0–52.0)
Hemoglobin: 13.1 g/dL (ref 13.0–17.0)
MCH: 32.3 pg (ref 26.0–34.0)
MCHC: 32.7 g/dL (ref 30.0–36.0)
MCV: 99 fL (ref 80.0–100.0)
Platelets: 179 10*3/uL (ref 150–400)
RBC: 4.05 MIL/uL — ABNORMAL LOW (ref 4.22–5.81)
RDW: 13.4 % (ref 11.5–15.5)
WBC: 6.2 10*3/uL (ref 4.0–10.5)
nRBC: 0 % (ref 0.0–0.2)

## 2020-02-09 LAB — HEMOGLOBIN A1C
Hgb A1c MFr Bld: 5.7 % — ABNORMAL HIGH (ref 4.8–5.6)
Mean Plasma Glucose: 116.89 mg/dL

## 2020-02-09 LAB — SURGICAL PCR SCREEN
MRSA, PCR: NEGATIVE
Staphylococcus aureus: NEGATIVE

## 2020-02-09 MED ORDER — BENZONATATE 200 MG PO CAPS: 200.0000 mg | ORAL_CAPSULE | Freq: Three times a day (TID) | ORAL | 1 refills | Status: DC | PRN

## 2020-02-09 MED ORDER — ACETAMINOPHEN 500 MG PO TABS: 500.0000 mg | ORAL_TABLET | Freq: Four times a day (QID) | ORAL | 0 refills | Status: DC | PRN

## 2020-02-09 MED ORDER — SALINE SPRAY 0.65 % NA SOLN
1.0000 | NASAL | 0 refills | Status: AC | PRN
Start: 2020-02-09 — End: ?

## 2020-02-09 NOTE — Progress Notes (Signed)
Virtual Visit via telephone Note Due to COVID-19 pandemic this visit was conducted virtually. This visit type was conducted due to national recommendations for restrictions regarding the COVID-19 Pandemic (e.g. social distancing, sheltering in place) in an effort to limit this patient's exposure and mitigate transmission in our community. All issues noted in this document were discussed and addressed.  A physical exam was not performed with this format.  I connected with Evan Smith on 02/09/20 at  3:20 PM by telephone and verified that I am speaking with the correct person using two identifiers. Evan Smith is currently located at at home and no one is currently with patient during visit. The provider, Daryll Drown, NP is located in their office at time of visit.  I discussed the limitations, risks, security and privacy concerns of performing an evaluation and management service by telephone and the availability of in person appointments. I also discussed with the patient that there may be a patient responsible charge related to this service. The patient expressed understanding and agreed to proceed.   History and Present Illness:  Cough This is a new problem. The current episode started in the past 7 days. The problem has been unchanged. The problem occurs constantly. The cough is productive of brown sputum. Associated symptoms include nasal congestion. Pertinent negatives include no chest pain, chills, ear congestion or shortness of breath. He has tried OTC cough suppressant for the symptoms. The treatment provided no relief.      Review of Systems  Constitutional: Negative for chills.  Respiratory: Positive for cough. Negative for shortness of breath.   Cardiovascular: Negative for chest pain.     Observations/Objective: Televisit  Assessment and Plan:  URI with cough and congestion Patient is reporting cough and congestion for the last 7 days.  Symptoms are not well  managed.  Patient is reporting yellow phlegm with cough, and has a history of seasonal allergies.  Patient denies headache, nausea vomiting, facial pressure, and fever.  Patient has tried over-the-counter cough suppressant with no therapeutic effect.  Provided education to patient with printed handouts given.  Notified patient that upper respiratory infection does not need antibiotic and most times caused by viral infection. Advised patient to come by clinic for Covid swab.  Patient declined and suggested to hold off until Monday prior to surgery. Advised patient to stay hydrated, saline nasal spray, Tylenol for pain/headache, Tessalon Perle for cough.  I did not order dexamethasone/decongestant at this time due to patient's blood pressure.  Follow-up with worsening or unresolved symptoms.  Follow Up Instructions: Follow-up with worsening or unresolved symptoms.   I discussed the assessment and treatment plan with the patient. The patient was provided an opportunity to ask questions and all were answered. The patient agreed with the plan and demonstrated an understanding of the instructions.   The patient was advised to call back or seek an in-person evaluation if the symptoms worsen or if the condition fails to improve as anticipated.  The above assessment and management plan was discussed with the patient. The patient verbalized understanding of and has agreed to the management plan. Patient is aware to call the clinic if symptoms persist or worsen. Patient is aware when to return to the clinic for a follow-up visit. Patient educated on when it is appropriate to go to the emergency department.   Time call ended:  3:33 pm  I provided 13 minutes of non-face-to-face time during this encounter.    Daryll Drown,  NP

## 2020-02-09 NOTE — Progress Notes (Signed)
PCP - Bennie Pierini @ [facility] Cardiologist - na   Chest x-ray - na EKG - 05/17/19 Stress Test - na ECHO - na Cardiac Cath - na  Sleep Study - yrs. ago CPAP - yes  Fasting Blood Sugar - 90 Checks Blood Sugar ___1-2__ times a week  Blood Thinner Instructions: na Aspirin Instructions:  Will call Dr. Conchita Paris  Pt. Reported he is treated for a sinus infection last week,states he is improving. Encourage pt. If he starts feeling worse to contact PCP again.   COVID TEST- 02/14/19   Anesthesia review:   Patient denies shortness of breath, fever, cough and chest pain at PAT appointment   All instructions explained to the patient, with a verbal understanding of the material. Patient agrees to go over the instructions while at home for a better understanding. Patient also instructed to self quarantine after being tested for COVID-19. The opportunity to ask questions was provided.

## 2020-02-09 NOTE — Assessment & Plan Note (Signed)
Patient is reporting cough and congestion for the last 7 days.  Symptoms are not well managed.  Patient is reporting yellow phlegm with cough, and has a history of seasonal allergies.  Patient denies headache, nausea vomiting, facial pressure, and fever.  Patient has tried over-the-counter cough suppressant with no therapeutic effect.  Provided education to patient with printed handouts given.  Notified patient that upper respiratory infection does not need antibiotic and most times caused by viral infection. Advised patient to come by clinic for Covid swab.  Patient declined and suggested to hold off until Monday prior to surgery. Advised patient to stay hydrated, saline nasal spray, Tylenol for pain/headache, Tessalon Perle for cough.  I did not order dexamethasone/decongestant at this time due to patient's blood pressure.  Follow-up with worsening or unresolved symptoms.

## 2020-02-14 ENCOUNTER — Other Ambulatory Visit (HOSPITAL_COMMUNITY)
Admission: RE | Admit: 2020-02-14 | Discharge: 2020-02-14 | Disposition: A | Discharge status: Home / Self Care | Payer: Medicare HMO | Source: Ambulatory Visit | Attending: Neurosurgery | Admitting: Neurosurgery

## 2020-02-14 DIAGNOSIS — Z01818 Encounter for other preprocedural examination: Secondary | ICD-10-CM | POA: Insufficient documentation

## 2020-02-14 DIAGNOSIS — Z20822 Contact with and (suspected) exposure to covid-19: Secondary | ICD-10-CM | POA: Diagnosis not present

## 2020-02-14 DIAGNOSIS — G4733 Obstructive sleep apnea (adult) (pediatric): Secondary | ICD-10-CM | POA: Diagnosis not present

## 2020-02-14 LAB — SARS CORONAVIRUS 2 (TAT 6-24 HRS): SARS Coronavirus 2: NEGATIVE

## 2020-02-14 MED ORDER — DEXTROSE 5 % IV SOLN
3.0000 g | INTRAVENOUS | Status: DC
  Filled 2020-02-14: qty 3000

## 2020-02-15 ENCOUNTER — Ambulatory Visit (HOSPITAL_COMMUNITY): Payer: Medicare HMO

## 2020-02-15 ENCOUNTER — Other Ambulatory Visit: Payer: Self-pay

## 2020-02-15 ENCOUNTER — Observation Stay (HOSPITAL_COMMUNITY)
Admission: RE | Admit: 2020-02-15 | Discharge: 2020-02-16 | Disposition: A | Discharge status: Home / Self Care | Payer: Medicare HMO | Attending: Neurosurgery | Admitting: Neurosurgery

## 2020-02-15 ENCOUNTER — Ambulatory Visit (HOSPITAL_COMMUNITY): Payer: Medicare HMO | Admitting: Certified Registered Nurse Anesthetist

## 2020-02-15 ENCOUNTER — Ambulatory Visit (HOSPITAL_COMMUNITY)
Admission: RE | Disposition: A | Discharge status: Home / Self Care | Payer: Self-pay | Source: Home / Self Care | Attending: Neurosurgery

## 2020-02-15 ENCOUNTER — Encounter (HOSPITAL_COMMUNITY): Payer: Self-pay | Admitting: Neurosurgery

## 2020-02-15 DIAGNOSIS — Z87891 Personal history of nicotine dependence: Secondary | ICD-10-CM | POA: Diagnosis not present

## 2020-02-15 DIAGNOSIS — E662 Morbid (severe) obesity with alveolar hypoventilation: Secondary | ICD-10-CM | POA: Diagnosis not present

## 2020-02-15 DIAGNOSIS — Z8249 Family history of ischemic heart disease and other diseases of the circulatory system: Secondary | ICD-10-CM | POA: Diagnosis not present

## 2020-02-15 DIAGNOSIS — R531 Weakness: Secondary | ICD-10-CM | POA: Diagnosis not present

## 2020-02-15 DIAGNOSIS — G992 Myelopathy in diseases classified elsewhere: Secondary | ICD-10-CM | POA: Diagnosis not present

## 2020-02-15 DIAGNOSIS — M5412 Radiculopathy, cervical region: Secondary | ICD-10-CM | POA: Insufficient documentation

## 2020-02-15 DIAGNOSIS — Z833 Family history of diabetes mellitus: Secondary | ICD-10-CM | POA: Diagnosis not present

## 2020-02-15 DIAGNOSIS — Z79899 Other long term (current) drug therapy: Secondary | ICD-10-CM | POA: Insufficient documentation

## 2020-02-15 DIAGNOSIS — M4802 Spinal stenosis, cervical region: Principal | ICD-10-CM | POA: Insufficient documentation

## 2020-02-15 DIAGNOSIS — G959 Disease of spinal cord, unspecified: Secondary | ICD-10-CM | POA: Diagnosis not present

## 2020-02-15 DIAGNOSIS — M4322 Fusion of spine, cervical region: Secondary | ICD-10-CM | POA: Diagnosis not present

## 2020-02-15 DIAGNOSIS — I10 Essential (primary) hypertension: Secondary | ICD-10-CM | POA: Insufficient documentation

## 2020-02-15 DIAGNOSIS — E785 Hyperlipidemia, unspecified: Secondary | ICD-10-CM | POA: Diagnosis not present

## 2020-02-15 DIAGNOSIS — Z7982 Long term (current) use of aspirin: Secondary | ICD-10-CM | POA: Diagnosis not present

## 2020-02-15 DIAGNOSIS — Z7984 Long term (current) use of oral hypoglycemic drugs: Secondary | ICD-10-CM | POA: Insufficient documentation

## 2020-02-15 DIAGNOSIS — Z6841 Body Mass Index (BMI) 40.0 and over, adult: Secondary | ICD-10-CM | POA: Diagnosis not present

## 2020-02-15 DIAGNOSIS — E1140 Type 2 diabetes mellitus with diabetic neuropathy, unspecified: Secondary | ICD-10-CM | POA: Insufficient documentation

## 2020-02-15 DIAGNOSIS — Z419 Encounter for procedure for purposes other than remedying health state, unspecified: Secondary | ICD-10-CM

## 2020-02-15 DIAGNOSIS — Z981 Arthrodesis status: Secondary | ICD-10-CM | POA: Diagnosis not present

## 2020-02-15 HISTORY — PX: ANTERIOR CERVICAL DECOMP/DISCECTOMY FUSION: SHX1161

## 2020-02-15 LAB — GLUCOSE, CAPILLARY
Glucose-Capillary: 96 mg/dL (ref 70–99)
Glucose-Capillary: 95 mg/dL (ref 70–99)
Glucose-Capillary: 119 mg/dL — ABNORMAL HIGH (ref 70–99)
Glucose-Capillary: 143 mg/dL — ABNORMAL HIGH (ref 70–99)
Glucose-Capillary: 124 mg/dL — ABNORMAL HIGH (ref 70–99)

## 2020-02-15 LAB — ABO/RH: ABO/RH(D): O POS

## 2020-02-15 SURGERY — ANTERIOR CERVICAL DECOMPRESSION/DISCECTOMY FUSION 3 LEVELS
Anesthesia: General | Site: Spine Cervical

## 2020-02-15 MED ORDER — PHENYLEPHRINE 40 MCG/ML (10ML) SYRINGE FOR IV PUSH (FOR BLOOD PRESSURE SUPPORT)
PREFILLED_SYRINGE | INTRAVENOUS | Status: DC | PRN
  Administered 2020-02-15: 80 ug via INTRAVENOUS

## 2020-02-15 MED ORDER — LACTATED RINGERS IV SOLN: INTRAVENOUS | Status: DC | PRN

## 2020-02-15 MED ORDER — DEXAMETHASONE SODIUM PHOSPHATE 10 MG/ML IJ SOLN
INTRAMUSCULAR | Status: DC | PRN
  Administered 2020-02-15: 10 mg via INTRAVENOUS

## 2020-02-15 MED ORDER — HYDROMORPHONE HCL 1 MG/ML IJ SOLN: 0.5000 mg | INTRAMUSCULAR | Status: DC | PRN

## 2020-02-15 MED ORDER — HYDROCODONE-ACETAMINOPHEN 5-325 MG PO TABS: 1.0000 | ORAL_TABLET | ORAL | Status: DC | PRN

## 2020-02-15 MED ORDER — CHLORHEXIDINE GLUCONATE CLOTH 2 % EX PADS: 6.0000 | MEDICATED_PAD | Freq: Once | CUTANEOUS | Status: DC

## 2020-02-15 MED ORDER — HYDROCHLOROTHIAZIDE 25 MG PO TABS
25.0000 mg | ORAL_TABLET | Freq: Every day | ORAL | Status: DC
  Administered 2020-02-16: 25 mg via ORAL
  Filled 2020-02-15: qty 1

## 2020-02-15 MED ORDER — ACETAMINOPHEN 10 MG/ML IV SOLN: 1000.0000 mg | Freq: Once | INTRAVENOUS | Status: DC | PRN

## 2020-02-15 MED ORDER — ONDANSETRON HCL 4 MG/2ML IJ SOLN: 4.0000 mg | Freq: Four times a day (QID) | INTRAMUSCULAR | Status: DC | PRN

## 2020-02-15 MED ORDER — ONDANSETRON HCL 4 MG/2ML IJ SOLN
INTRAMUSCULAR | Status: AC
  Filled 2020-02-15: qty 2

## 2020-02-15 MED ORDER — THROMBIN 5000 UNITS EX SOLR
OROMUCOSAL | Status: DC | PRN
  Administered 2020-02-15 (×2): 5 mL via TOPICAL

## 2020-02-15 MED ORDER — METFORMIN HCL ER 500 MG PO TB24
500.0000 mg | ORAL_TABLET | Freq: Every day | ORAL | Status: DC
  Administered 2020-02-16: 500 mg via ORAL
  Filled 2020-02-15: qty 1

## 2020-02-15 MED ORDER — CEFAZOLIN SODIUM-DEXTROSE 2-4 GM/100ML-% IV SOLN
2.0000 g | Freq: Three times a day (TID) | INTRAVENOUS | Status: AC
  Administered 2020-02-15 – 2020-02-16 (×2): 2 g via INTRAVENOUS
  Filled 2020-02-15 (×2): qty 100

## 2020-02-15 MED ORDER — ACETAMINOPHEN 325 MG PO TABS: 650.0000 mg | ORAL_TABLET | ORAL | Status: DC | PRN

## 2020-02-15 MED ORDER — ONDANSETRON HCL 4 MG PO TABS: 4.0000 mg | ORAL_TABLET | Freq: Four times a day (QID) | ORAL | Status: DC | PRN

## 2020-02-15 MED ORDER — ORAL CARE MOUTH RINSE: 15.0000 mL | Freq: Once | OROMUCOSAL | Status: AC

## 2020-02-15 MED ORDER — MIDAZOLAM HCL 2 MG/2ML IJ SOLN
INTRAMUSCULAR | Status: AC
  Filled 2020-02-15: qty 2

## 2020-02-15 MED ORDER — SODIUM CHLORIDE 0.9% FLUSH: 3.0000 mL | Freq: Two times a day (BID) | INTRAVENOUS | Status: DC

## 2020-02-15 MED ORDER — LIDOCAINE 2% (20 MG/ML) 5 ML SYRINGE
INTRAMUSCULAR | Status: DC | PRN
  Administered 2020-02-15: 100 mg via INTRAVENOUS

## 2020-02-15 MED ORDER — MIDAZOLAM HCL 5 MG/5ML IJ SOLN
INTRAMUSCULAR | Status: DC | PRN
  Administered 2020-02-15: 2 mg via INTRAVENOUS

## 2020-02-15 MED ORDER — OXYCODONE HCL 5 MG PO TABS
5.0000 mg | ORAL_TABLET | ORAL | Status: DC | PRN
  Administered 2020-02-15 – 2020-02-16 (×4): 10 mg via ORAL
  Administered 2020-02-16: 5 mg via ORAL
  Filled 2020-02-15: qty 2
  Filled 2020-02-15: qty 1
  Filled 2020-02-15 (×3): qty 2

## 2020-02-15 MED ORDER — ACETAMINOPHEN 650 MG RE SUPP: 650.0000 mg | RECTAL | Status: DC | PRN

## 2020-02-15 MED ORDER — SODIUM CHLORIDE 0.9% FLUSH: 3.0000 mL | INTRAVENOUS | Status: DC | PRN

## 2020-02-15 MED ORDER — DEXTROSE 5 % IV SOLN
INTRAVENOUS | Status: DC | PRN
  Administered 2020-02-15: 3 g via INTRAVENOUS

## 2020-02-15 MED ORDER — HYDROMORPHONE HCL 1 MG/ML IJ SOLN
INTRAMUSCULAR | Status: AC
  Filled 2020-02-15: qty 1

## 2020-02-15 MED ORDER — ACETAMINOPHEN 500 MG PO TABS
1000.0000 mg | ORAL_TABLET | Freq: Four times a day (QID) | ORAL | Status: DC
  Administered 2020-02-15 – 2020-02-16 (×3): 1000 mg via ORAL
  Filled 2020-02-15 (×3): qty 2

## 2020-02-15 MED ORDER — FENTANYL CITRATE (PF) 250 MCG/5ML IJ SOLN
INTRAMUSCULAR | Status: DC | PRN
  Administered 2020-02-15: 100 ug via INTRAVENOUS
  Administered 2020-02-15 (×3): 50 ug via INTRAVENOUS

## 2020-02-15 MED ORDER — PROPOFOL 10 MG/ML IV BOLUS
INTRAVENOUS | Status: DC | PRN
  Administered 2020-02-15: 150 mg via INTRAVENOUS

## 2020-02-15 MED ORDER — SIMVASTATIN 20 MG PO TABS: 40.0000 mg | ORAL_TABLET | Freq: Every day | ORAL | Status: DC

## 2020-02-15 MED ORDER — SENNA 8.6 MG PO TABS
1.0000 | ORAL_TABLET | Freq: Two times a day (BID) | ORAL | Status: DC
  Administered 2020-02-15 – 2020-02-16 (×2): 8.6 mg via ORAL
  Filled 2020-02-15 (×2): qty 1

## 2020-02-15 MED ORDER — LIDOCAINE 2% (20 MG/ML) 5 ML SYRINGE
INTRAMUSCULAR | Status: AC
  Filled 2020-02-15: qty 5

## 2020-02-15 MED ORDER — DEXAMETHASONE SODIUM PHOSPHATE 10 MG/ML IJ SOLN
INTRAMUSCULAR | Status: AC
  Filled 2020-02-15: qty 1

## 2020-02-15 MED ORDER — ROCURONIUM BROMIDE 10 MG/ML (PF) SYRINGE
PREFILLED_SYRINGE | INTRAVENOUS | Status: AC
  Filled 2020-02-15: qty 10

## 2020-02-15 MED ORDER — LIDOCAINE-EPINEPHRINE 1 %-1:100000 IJ SOLN
INTRAMUSCULAR | Status: AC
  Filled 2020-02-15: qty 1

## 2020-02-15 MED ORDER — SUGAMMADEX SODIUM 500 MG/5ML IV SOLN
INTRAVENOUS | Status: AC
  Filled 2020-02-15: qty 5

## 2020-02-15 MED ORDER — BISACODYL 10 MG RE SUPP: 10.0000 mg | Freq: Every day | RECTAL | Status: DC | PRN

## 2020-02-15 MED ORDER — AMLODIPINE BESYLATE 5 MG PO TABS
5.0000 mg | ORAL_TABLET | Freq: Every day | ORAL | Status: DC
  Administered 2020-02-16: 5 mg via ORAL
  Filled 2020-02-15: qty 1

## 2020-02-15 MED ORDER — SUGAMMADEX SODIUM 500 MG/5ML IV SOLN
INTRAVENOUS | Status: DC | PRN
  Administered 2020-02-15: 300 mg via INTRAVENOUS

## 2020-02-15 MED ORDER — ZOLPIDEM TARTRATE 5 MG PO TABS
5.0000 mg | ORAL_TABLET | Freq: Every evening | ORAL | Status: DC | PRN
Start: 2020-02-15 — End: 2020-02-16

## 2020-02-15 MED ORDER — SENNOSIDES-DOCUSATE SODIUM 8.6-50 MG PO TABS: 1.0000 | ORAL_TABLET | Freq: Every evening | ORAL | Status: DC | PRN

## 2020-02-15 MED ORDER — FENTANYL CITRATE (PF) 250 MCG/5ML IJ SOLN
INTRAMUSCULAR | Status: AC
  Filled 2020-02-15: qty 5

## 2020-02-15 MED ORDER — LACTATED RINGERS IV SOLN: INTRAVENOUS | Status: DC

## 2020-02-15 MED ORDER — LIDOCAINE-EPINEPHRINE 1 %-1:100000 IJ SOLN
INTRAMUSCULAR | Status: DC | PRN
  Administered 2020-02-15: 4 mL

## 2020-02-15 MED ORDER — ROCURONIUM BROMIDE 10 MG/ML (PF) SYRINGE
PREFILLED_SYRINGE | INTRAVENOUS | Status: DC | PRN
  Administered 2020-02-15 (×3): 20 mg via INTRAVENOUS
  Administered 2020-02-15: 80 mg via INTRAVENOUS

## 2020-02-15 MED ORDER — ATORVASTATIN CALCIUM 10 MG PO TABS
20.0000 mg | ORAL_TABLET | Freq: Every day | ORAL | Status: DC
  Administered 2020-02-15: 20 mg via ORAL
  Filled 2020-02-15: qty 2

## 2020-02-15 MED ORDER — THROMBIN 5000 UNITS EX SOLR
CUTANEOUS | Status: AC
  Filled 2020-02-15: qty 5000

## 2020-02-15 MED ORDER — 0.9 % SODIUM CHLORIDE (POUR BTL) OPTIME
TOPICAL | Status: DC | PRN
  Administered 2020-02-15: 1000 mL

## 2020-02-15 MED ORDER — PROPOFOL 10 MG/ML IV BOLUS
INTRAVENOUS | Status: AC
  Filled 2020-02-15: qty 20

## 2020-02-15 MED ORDER — PANTOPRAZOLE SODIUM 40 MG PO TBEC
40.0000 mg | DELAYED_RELEASE_TABLET | Freq: Every day | ORAL | Status: DC
  Administered 2020-02-16: 40 mg via ORAL
  Filled 2020-02-15: qty 1

## 2020-02-15 MED ORDER — PHENYLEPHRINE HCL-NACL 10-0.9 MG/250ML-% IV SOLN
INTRAVENOUS | Status: DC | PRN
  Administered 2020-02-15: 25 ug/min via INTRAVENOUS

## 2020-02-15 MED ORDER — METHOCARBAMOL 1000 MG/10ML IJ SOLN: 500.0000 mg | Freq: Four times a day (QID) | INTRAVENOUS | Status: DC | PRN

## 2020-02-15 MED ORDER — PHENOL 1.4 % MT LIQD: 1.0000 | OROMUCOSAL | Status: DC | PRN

## 2020-02-15 MED ORDER — GABAPENTIN 300 MG PO CAPS
300.0000 mg | ORAL_CAPSULE | Freq: Every day | ORAL | Status: DC
  Administered 2020-02-15: 300 mg via ORAL
  Filled 2020-02-15: qty 1

## 2020-02-15 MED ORDER — PHENYLEPHRINE 40 MCG/ML (10ML) SYRINGE FOR IV PUSH (FOR BLOOD PRESSURE SUPPORT)
PREFILLED_SYRINGE | INTRAVENOUS | Status: AC
  Filled 2020-02-15: qty 10

## 2020-02-15 MED ORDER — MENTHOL 3 MG MT LOZG: 1.0000 | LOZENGE | OROMUCOSAL | Status: DC | PRN

## 2020-02-15 MED ORDER — LISINOPRIL-HYDROCHLOROTHIAZIDE 20-25 MG PO TABS: 1.0000 | ORAL_TABLET | Freq: Every day | ORAL | Status: DC

## 2020-02-15 MED ORDER — ONDANSETRON HCL 4 MG/2ML IJ SOLN
INTRAMUSCULAR | Status: DC | PRN
  Administered 2020-02-15: 4 mg via INTRAVENOUS

## 2020-02-15 MED ORDER — FLEET ENEMA 7-19 GM/118ML RE ENEM: 1.0000 | ENEMA | Freq: Once | RECTAL | Status: DC | PRN

## 2020-02-15 MED ORDER — CHLORHEXIDINE GLUCONATE 0.12 % MT SOLN
15.0000 mL | Freq: Once | OROMUCOSAL | Status: AC
  Administered 2020-02-15: 15 mL via OROMUCOSAL
  Filled 2020-02-15: qty 15

## 2020-02-15 MED ORDER — LISINOPRIL 20 MG PO TABS
20.0000 mg | ORAL_TABLET | Freq: Every day | ORAL | Status: DC
  Administered 2020-02-16: 20 mg via ORAL
  Filled 2020-02-15: qty 1

## 2020-02-15 MED ORDER — BUPIVACAINE HCL (PF) 0.5 % IJ SOLN
INTRAMUSCULAR | Status: AC
  Filled 2020-02-15: qty 30

## 2020-02-15 MED ORDER — HYDROMORPHONE HCL 1 MG/ML IJ SOLN
0.2500 mg | INTRAMUSCULAR | Status: DC | PRN
  Administered 2020-02-15 (×4): 0.5 mg via INTRAVENOUS

## 2020-02-15 MED ORDER — ONDANSETRON HCL 4 MG/2ML IJ SOLN
4.0000 mg | Freq: Once | INTRAMUSCULAR | Status: AC | PRN
  Administered 2020-02-15: 4 mg via INTRAVENOUS

## 2020-02-15 MED ORDER — BUPIVACAINE HCL 0.5 % IJ SOLN
INTRAMUSCULAR | Status: DC | PRN
  Administered 2020-02-15: 4 mL

## 2020-02-15 MED ORDER — DOCUSATE SODIUM 100 MG PO CAPS
100.0000 mg | ORAL_CAPSULE | Freq: Two times a day (BID) | ORAL | Status: DC
  Administered 2020-02-15 – 2020-02-16 (×2): 100 mg via ORAL
  Filled 2020-02-15 (×2): qty 1

## 2020-02-15 MED ORDER — SODIUM CHLORIDE 0.9 % IV SOLN: INTRAVENOUS | Status: DC

## 2020-02-15 MED ORDER — METHOCARBAMOL 500 MG PO TABS
500.0000 mg | ORAL_TABLET | Freq: Four times a day (QID) | ORAL | Status: DC | PRN
  Administered 2020-02-15 – 2020-02-16 (×2): 500 mg via ORAL
  Filled 2020-02-15 (×2): qty 1

## 2020-02-15 MED ORDER — SODIUM CHLORIDE 0.9 % IV SOLN: 250.0000 mL | INTRAVENOUS | Status: DC

## 2020-02-15 SURGICAL SUPPLY — 63 items
BAND RUBBER #18 3X1/16 STRL (MISCELLANEOUS) ×4 IMPLANT
BENZOIN TINCTURE PRP APPL 2/3 (GAUZE/BANDAGES/DRESSINGS) IMPLANT
BLADE CLIPPER SURG (BLADE) IMPLANT
BLADE SURG 11 STRL SS (BLADE) ×2 IMPLANT
BLADE ULTRA TIP 2M (BLADE) IMPLANT
BUR MATCHSTICK NEURO 3.0 LAGG (BURR) ×2 IMPLANT
CAGE LORDOTIC 6 SM (Cage) ×2 IMPLANT
CANISTER SUCT 3000ML PPV (MISCELLANEOUS) ×2 IMPLANT
CARTRIDGE OIL MAESTRO DRILL (MISCELLANEOUS) ×1 IMPLANT
COVER WAND RF STERILE (DRAPES) IMPLANT
DECANTER SPIKE VIAL GLASS SM (MISCELLANEOUS) ×2 IMPLANT
DERMABOND ADVANCED (GAUZE/BANDAGES/DRESSINGS) ×1
DERMABOND ADVANCED .7 DNX12 (GAUZE/BANDAGES/DRESSINGS) ×1 IMPLANT
DEVICE ENDSKLTN IMPL 16X14X7X6 (Cage) ×2 IMPLANT
DIFFUSER DRILL AIR PNEUMATIC (MISCELLANEOUS) ×2 IMPLANT
DRAIN CHANNEL 10M FLAT 3/4 FLT (DRAIN) IMPLANT
DRAPE C-ARM 42X72 X-RAY (DRAPES) ×4 IMPLANT
DRAPE HALF SHEET 40X57 (DRAPES) ×2 IMPLANT
DRAPE LAPAROTOMY 100X72 PEDS (DRAPES) ×2 IMPLANT
DRAPE MICROSCOPE LEICA (MISCELLANEOUS) ×2 IMPLANT
DRSG OPSITE POSTOP 3X4 (GAUZE/BANDAGES/DRESSINGS) IMPLANT
DRSG OPSITE POSTOP 4X6 (GAUZE/BANDAGES/DRESSINGS) ×2 IMPLANT
DURAPREP 6ML APPLICATOR 50/CS (WOUND CARE) ×2 IMPLANT
ELECT COATED BLADE 2.86 ST (ELECTRODE) ×2 IMPLANT
ENDOSKELETON IMPLANT 16X14X7X6 (Cage) ×4 IMPLANT
EVACUATOR SILICONE 100CC (DRAIN) IMPLANT
GAUZE 4X4 16PLY RFD (DISPOSABLE) IMPLANT
GLOVE BIO SURGEON STRL SZ 6.5 (GLOVE) ×10 IMPLANT
GLOVE BIO SURGEON STRL SZ7.5 (GLOVE) ×6 IMPLANT
GLOVE BIOGEL PI IND STRL 7.5 (GLOVE) ×2 IMPLANT
GLOVE BIOGEL PI INDICATOR 7.5 (GLOVE) ×2
GLOVE ECLIPSE 7.0 STRL STRAW (GLOVE) ×2 IMPLANT
GLOVE EXAM NITRILE XL STR (GLOVE) IMPLANT
GLOVE SURG SS PI 6.0 STRL IVOR (GLOVE) ×4 IMPLANT
GLOVE SURG UNDER POLY LF SZ6.5 (GLOVE) ×6 IMPLANT
GOWN STRL REUS W/ TWL LRG LVL3 (GOWN DISPOSABLE) ×5 IMPLANT
GOWN STRL REUS W/ TWL XL LVL3 (GOWN DISPOSABLE) IMPLANT
GOWN STRL REUS W/TWL 2XL LVL3 (GOWN DISPOSABLE) IMPLANT
GOWN STRL REUS W/TWL LRG LVL3 (GOWN DISPOSABLE) ×10
GOWN STRL REUS W/TWL XL LVL3 (GOWN DISPOSABLE)
HEMOSTAT POWDER KIT SURGIFOAM (HEMOSTASIS) ×4 IMPLANT
KIT BASIN OR (CUSTOM PROCEDURE TRAY) ×2 IMPLANT
KIT TURNOVER KIT B (KITS) ×2 IMPLANT
NEEDLE HYPO 22GX1.5 SAFETY (NEEDLE) ×2 IMPLANT
NEEDLE SPNL 22GX3.5 QUINCKE BK (NEEDLE) ×2 IMPLANT
NS IRRIG 1000ML POUR BTL (IV SOLUTION) ×2 IMPLANT
OIL CARTRIDGE MAESTRO DRILL (MISCELLANEOUS) ×2
PACK LAMINECTOMY NEURO (CUSTOM PROCEDURE TRAY) ×2 IMPLANT
PAD ARMBOARD 7.5X6 YLW CONV (MISCELLANEOUS) ×6 IMPLANT
PLATE ZEVO 1LVL 17MM (Plate) ×4 IMPLANT
PLATE ZEVO 1LVL 19MM (Plate) ×2 IMPLANT
PUTTY DBF 1CC CORTICAL FIBERS (Putty) ×2 IMPLANT
PUTTY DBF 3CC CORTICAL FIBERS (Putty) ×2 IMPLANT
SCREW 3.5 SELFDRILL 15MM VARI (Screw) ×24 IMPLANT
SPONGE INTESTINAL PEANUT (DISPOSABLE) ×2 IMPLANT
SPONGE SURGIFOAM ABS GEL 100 (HEMOSTASIS) IMPLANT
STRIP CLOSURE SKIN 1/2X4 (GAUZE/BANDAGES/DRESSINGS) IMPLANT
SUT VIC AB 3-0 SH 8-18 (SUTURE) ×2 IMPLANT
SUT VICRYL 3-0 RB1 18 ABS (SUTURE) ×2 IMPLANT
TAPE CLOTH 3X10 TAN LF (GAUZE/BANDAGES/DRESSINGS) ×2 IMPLANT
TOWEL GREEN STERILE (TOWEL DISPOSABLE) ×2 IMPLANT
TOWEL GREEN STERILE FF (TOWEL DISPOSABLE) ×2 IMPLANT
WATER STERILE IRR 1000ML POUR (IV SOLUTION) ×2 IMPLANT

## 2020-02-15 NOTE — Progress Notes (Signed)
Notified Dr. Okey Dupre of patient taking his lisinopril-hydrochlorothiazide this morning at 5am.

## 2020-02-15 NOTE — Transfer of Care (Signed)
Immediate Anesthesia Transfer of Care Note  Patient: Evan Smith  Procedure(s) Performed: ANTERIOR CERVICAL DECOMPRESSON FUSION CERVICAL THREE-FOUR, CERVICAL FOUR-FIVE,CERVICAL FIVE-SIX. (N/A Spine Cervical)  Patient Location: PACU  Anesthesia Type:General  Level of Consciousness: awake, alert  and oriented  Airway & Oxygen Therapy: Patient Spontanous Breathing  Post-op Assessment: Report given to RN, Post -op Vital signs reviewed and stable and Patient moving all extremities X 4  Post vital signs: Reviewed and stable  Last Vitals:  Vitals Value Taken Time  BP 118/77 02/15/20 1342  Temp    Pulse 93 02/15/20 1343  Resp 16 02/15/20 1343  SpO2 96 % 02/15/20 1343  Vitals shown include unvalidated device data.  Last Pain:  Vitals:   02/15/20 0710  TempSrc: Oral  PainSc:       Patients Stated Pain Goal: 2 (02/15/20 0709)  Complications: No complications documented.

## 2020-02-15 NOTE — Anesthesia Preprocedure Evaluation (Signed)
Anesthesia Evaluation  Patient identified by MRN, date of birth, ID band Patient awake    Reviewed: Allergy & Precautions, H&P , NPO status , Patient's Chart, lab work & pertinent test results  History of Anesthesia Complications (+) PONV  Airway Mallampati: III  TM Distance: <3 FB Neck ROM: Limited    Dental no notable dental hx.    Pulmonary sleep apnea and Continuous Positive Airway Pressure Ventilation , former smoker,    Pulmonary exam normal breath sounds clear to auscultation       Cardiovascular hypertension, Pt. on medications Normal cardiovascular exam Rhythm:Regular Rate:Normal     Neuro/Psych negative neurological ROS  negative psych ROS   GI/Hepatic Neg liver ROS, GERD  ,  Endo/Other  diabetes, Type 2, Insulin DependentMorbid obesity  Renal/GU negative Renal ROS  negative genitourinary   Musculoskeletal negative musculoskeletal ROS (+)   Abdominal   Peds negative pediatric ROS (+)  Hematology negative hematology ROS (+)   Anesthesia Other Findings   Reproductive/Obstetrics negative OB ROS                             Anesthesia Physical Anesthesia Plan  ASA: III  Anesthesia Plan: General   Post-op Pain Management:    Induction: Intravenous  PONV Risk Score and Plan: 3 and Ondansetron, Dexamethasone, Midazolam and Treatment may vary due to age or medical condition  Airway Management Planned: Oral ETT and Video Laryngoscope Planned  Additional Equipment:   Intra-op Plan:   Post-operative Plan: Extubation in OR  Informed Consent: I have reviewed the patients History and Physical, chart, labs and discussed the procedure including the risks, benefits and alternatives for the proposed anesthesia with the patient or authorized representative who has indicated his/her understanding and acceptance.     Dental advisory given  Plan Discussed with: CRNA and  Surgeon  Anesthesia Plan Comments:         Anesthesia Quick Evaluation

## 2020-02-15 NOTE — Op Note (Signed)
NEUROSURGERY OPERATIVE NOTE   PREOP DIAGNOSIS: Cervical Stenosis with Myelopathy, C3-4, C4-5, C5-6  POSTOP DIAGNOSIS: Same  PROCEDURE: 1. Discectomy at C3-4, C4-5, C5-6 for decompression of spinal cord and exiting nerve roots  2. Placement of intervertebral biomechanical device, Medtronic Titan 40mm small @ C3-4, 5mm medium @ C4-5, C5-6 3. Placement of anterior instrumentation consisting of interbody plate and screws - Medtronic Zevo 25mm @ C3-4, C4-5, 81mm @ C5-6 4. Use of morselized bone allograft  5. Arthrodesis C3-4, C4-5, C5-6, anterior interbody technique  6. Use of intraoperative microscope  SURGEON: Dr. Lisbeth Renshaw, MD  ASSISTANT: Cindra Presume, PA-C  ANESTHESIA: General Endotracheal  EBL: 150cc  SPECIMENS: None  DRAINS: None  COMPLICATIONS: None immediate  CONDITION: Hemodynamically stable to PACU  HISTORY: Evan Smith is a 65 y.o.   PROCEDURE IN DETAIL: The patient was brought to the operating room and transferred to the operative table. After induction of general anesthesia, the patient was positioned on the operative table in the supine position with all pressure points meticulously padded. The skin of the neck was then prepped and draped in the usual sterile fashion.  After timeout was conducted, the skin was infiltrated with local anesthetic. Skin incision was then made sharply and Bovie electrocautery was used to dissect the subcutaneous tissue until the platysma was identified. The platysma was then divided and undermined. The sternocleidomastoid muscle was then identified and, utilizing natural fascial planes in the neck, the prevertebral fascia was identified and the carotid sheath was retracted laterally and the trachea and esophagus retracted medially. Again using fluoroscopy, the correct disc spaces were identified with placement of a spinal needle. Bovie electrocautery was used to dissect in the subperiosteal plane and elevate the bilateral  longus coli muscles. Self-retaining retractors were then placed. At this point, the microscope was draped and brought into the field, and the remainder of the case was done under the microscope using microdissecting technique.  The C5-6 disc space was incised sharply and rongeurs were use to initially complete a discectomy. The high-speed drill was then used to complete discectomy until the posterior annulus was identified and removed and the posterior longitudinal ligament was identified. Using a nerve hook, the PLL was elevated, and Kerrison rongeurs were used to remove the posterior longitudinal ligament and the ventral thecal sac was identified. Using a combination of curettes and rongeurs, complete decompression of the thecal sac and exiting nerve roots at this level was completed, including removal of a portion of the uncovertebral joint. I was then able to easily pass a ball dissector in the ventral epidural space.  At this point, an interbody cage was sized and packed with morcellized bone allograft. Endplate was prepared with curettes. The cage was then inserted and tapped into place flush with the anterior vertebral body. Above plate was then selected and placed across the interspace and secured into C5 and C6 with 55mm screws.  Attention was then turned to the C4-5 level. In a similar fashion, discectomy was completed initially with curettes and rongeurs, and completed with the drill. The PLL was again identified, elevated and incised. Using Kerrison rongeurs, decompression of the spinal cord and exiting roots was completed, again with removal of a portion of the uncovertebral joint and confirmed with a dissector.  The interbody cage was then sized and filled with bone allograft. Endplates were again prepared with curettes for arthrodesis. The cage was tapped into place. Plate was placed and secured again with 84mm screws.  Attention was then  turned to the final C3-4 level. In a similar fashion,  discectomy was completed initially with curettes and rongeurs, and completed with the drill. The PLL was again identified, elevated and incised. Using Kerrison rongeurs, decompression of the spinal cord and exiting roots was completed and confirmed with a dissector.  A 87mm lordotic interbody cage was then sized and filled with bone allograft. Endplates at C3 and C4 were prepared with curettes. The cage was then tapped into place. Anterior plate was placed across the interspace and secured with screws.   Final fluoroscopic images in lateral projection was taken to confirm good hardware placement and good cervical alignment.  At this point, after all counts were verified to be correct, meticulous hemostasis was secured using a combination of bipolar electrocautery and morcellized gelfoam with thrombin. The platysma muscle was then closed using interrupted 3-0 Vicryl sutures, and the skin was closed with a interrupted subcuticular stitch. Sterile dressings were then applied and the drapes removed.  The patient tolerated the procedure well and was extubated in the room and taken to the postanesthesia care unit in stable condition. Final counts of instruments, sponge, needle and cottonoids was correct.    Lisbeth Renshaw, MD [facility]

## 2020-02-15 NOTE — Anesthesia Procedure Notes (Signed)
Procedure Name: Intubation Date/Time: 02/15/2020 9:48 AM Performed by: Nils Pyle, CRNA Pre-anesthesia Checklist: Patient identified, Emergency Drugs available, Suction available and Patient being monitored Patient Re-evaluated:Patient Re-evaluated prior to induction Oxygen Delivery Method: Circle System Utilized Preoxygenation: Pre-oxygenation with 100% oxygen Induction Type: IV induction Ventilation: Mask ventilation without difficulty and Oral airway inserted - appropriate to patient size Laryngoscope Size: Glidescope and 4 Grade View: Grade I Tube type: Oral Tube size: 7.5 mm Number of attempts: 1 Airway Equipment and Method: Stylet and Oral airway Placement Confirmation: ETT inserted through vocal cords under direct vision,  positive ETCO2 and breath sounds checked- equal and bilateral Secured at: 23 cm Tube secured with: Tape Dental Injury: Teeth and Oropharynx as per pre-operative assessment

## 2020-02-15 NOTE — H&P (Signed)
Chief Complaint   No chief complaint on file.   HPI   HPI: Evan Smith is a 65 y.o. male who was initially evaluated by Orthopedics for left arm weakness and decreased range of motion at the left shoulder.  He has noted these problems progressing over the last few months.  He does complain of some pain primarily in the right shoulder which is relatively constant, not related to movement of the right shoulder.  He says that sometimes wakes him up from sleep.  He does not report any neck pain.  He does report some paresthesias primarily in the 1st 3 digits of the right hand.  No similar symptoms on the left side.  He has also noted weakness of the left arm involving raising his shoulder, as well as some biceps and triceps weakness.  He does also note chronic history of poor balance, and has suffered handful of falls over the past few years.  He does not report any changes in bowel or bladder function. As part of work up he underwent MRI C spine which revealed fairly severe stenosis from C3-4 to C5-6. Given degree of stenosis and weakness, it was recommended he undergo decompression and fusion. He presents today for surgery. He is without any concerns.  Of note, the patient did undergo cervical diskectomy and fusion by Dr. Joya Salm about 20 years ago at C6-7.  He has also undergone prior low back surgeries by Dr. Sherwood Gambler and Dr. Patrice Paradise.  He has a history of hypertension controlled with medication and is prediabetic also controlled with metformin.  No history of heart attacks or strokes.   Patient Active Problem List   Diagnosis Date Noted  . URI with cough and congestion 02/09/2020  . Essential hypertension 11/15/2019  . Chronic midline low back pain with bilateral sciatica 11/15/2019  . Cervical radiculopathy at C6 11/03/2019  . Allergic rhinitis 11/24/2015  . Severe obesity (BMI >= 40) ([place]) 11/24/2015  . Neuropathy 03/24/2015  . Diabetes ([place]) 03/09/2015  . Hyperlipidemia 02/13/2012  .  Sleep apnea 02/13/2012    PMH: Past Medical History:  Diagnosis Date  . Allergic rhinitis    by referral on 11/02/19/  . Arthritis   . Cataract   . Chronic left shoulder pain    From [facility] received 11/02/19/  . DDD (degenerative disc disease)   . Dyspnea    on exertion   . GERD (gastroesophageal reflux disease)   . Hyperlipidemia   . Hypertension   . Intestinal flu 2000  . OSA on CPAP    MODERATE OSA PER STUDY 2010  . Perianal fistula   . PONV (postoperative nausea and vomiting)    and hear to wake  . Severe obesity (BMI >= 40) ([place])    Received from [facility] on 11/02/19/  . Type 2 diabetes mellitus with diabetic polyneuropathy, without long-term current use of insulin (H[facility])    pt. states he's borderline    PSH: Past Surgical History:  Procedure Laterality Date  . CERVICAL FUSION  1990's  . EVALUATION UNDER ANESTHESIA WITH ANAL FISTULECTOMY N/A 08/03/2012   Procedure: EXAM UNDER ANESTHESIA WITH ANAL FISTULECTOMY ;  Surgeon: Leighton Ruff, MD;  Location: [place];  Service: General;  Laterality: N/A;  . INCISION AND DRAINAGE PERIRECTAL ABSCESS  02/13/2012   Procedure: IRRIGATION AND DEBRIDEMENT PERIRECTAL ABSCESS;  Surgeon: Earnstine Regal, MD;  Location: [place];  Service: General;  Laterality: N/A;  . LUMBAR FUSION  2010   has had  2 low back surgeries   . RIGHT HYDROCELECTOMY  07-28-2001  . SHOULDER ARTHROSCOPY WITH ROTATOR CUFF REPAIR Left 1990's    Medications Prior to Admission  Medication Sig Dispense Refill Last Dose  . amLODipine (NORVASC) 5 MG tablet Take 1 tablet (5 mg total) by mouth daily. 90 tablet 1  at 0500  . benzonatate (TESSALON) 200 MG capsule Take 1 capsule (200 mg total) by mouth 3 (three) times daily as needed. 30 capsule 1 02/14/2020 at Unknown time  . Cholecalciferol (VITAMIN D3) 1000 UNITS CAPS Take 1,000 Units by mouth daily.     . cyclobenzaprine (FLEXERIL) 10 MG tablet Take 1 tablet by mouth three times daily as needed for muscle  spasm 30 tablet 0 Past Week at Unknown time  . fluticasone (FLONASE) 50 MCG/ACT nasal spray USE 2 SPRAYS IN EACH NOSTRIL EVERY DAY (Patient taking differently: Place 2 sprays into both nostrils daily.) 48 g 1 Past Week at Unknown time  . gabapentin (NEURONTIN) 300 MG capsule TAKE ONE CAPSULE BY MOUTH ONCE DAILY AT BEDTIME (Patient taking differently: Take 300 mg by mouth at bedtime. TAKE ONE CAPSULE BY MOUTH ONCE DAILY AT BEDTIME) 90 capsule 0 Past Week at Unknown time  . lisinopril-hydrochlorothiazide (ZESTORETIC) 20-25 MG tablet Take 1 tablet by mouth daily. 90 tablet 1 02/15/2020 at 0530  . loratadine (CLARITIN) 10 MG tablet TAKE 1 TABLET EVERY DAY (Patient taking differently: Take 10 mg by mouth daily.) 90 tablet 1 Past Week at Unknown time  . metFORMIN (GLUCOPHAGE-XR) 500 MG 24 hr tablet Take 1 tablet (500 mg total) by mouth daily with breakfast. 90 tablet 1 Past Week at Unknown time  . naproxen (NAPROSYN) 500 MG tablet Take 1 tablet (500 mg total) by mouth 2 (two) times daily with a meal. 180 tablet 2 Past Week at Unknown time  . omeprazole (PRILOSEC) 40 MG capsule TAKE 1 CAPSULE EVERY MORNING (Patient taking differently: Take 40 mg by mouth daily. TAKE 1 CAPSULE EVERY MORNING) 90 capsule 1 02/15/2020 at 0500  . potassium gluconate 595 (99 K) MG TABS tablet Take 99 mg by mouth daily.   02/15/2020 at 0500  . promethazine-dextromethorphan (PROMETHAZINE-DM) 6.25-15 MG/5ML syrup TAKE 5 ML BY MOUTH  THREE TIMES DAILY AS NEEDED FOR COUGH 118 mL 0 Past Week at Unknown time  . sildenafil (REVATIO) 20 MG tablet 1-2 tablets daily prn (Patient taking differently: Take 20-40 mg by mouth daily as needed (ED). 1-2 tablets daily prn) 30 tablet 5 Past Month at Unknown time  . simvastatin (ZOCOR) 40 MG tablet Take 1 tablet (40 mg total) by mouth at bedtime. 90 tablet 1 Past Week at Unknown time  . sodium chloride (OCEAN) 0.65 % SOLN nasal spray Place 1 spray into both nostrils as needed for congestion. 60 mL 0 02/14/2020  at Unknown time  . acetaminophen (TYLENOL) 500 MG tablet Take 1 tablet (500 mg total) by mouth every 6 (six) hours as needed. 30 tablet 0 More than a month at Unknown time  . Alcohol Swabs (B-D SINGLE USE SWABS REGULAR) PADS Test BS daily Dx E11.9 100 each 3 Unknown at Unknown time  . aspirin 81 MG tablet Take 81 mg by mouth daily.   02/09/2020 at Unknown time  . Blood Glucose Calibration (TRUE METRIX LEVEL 1) Low SOLN Test BS daily Dx E11.9 1 each 3 Unknown at Unknown time  . Blood Glucose Monitoring Suppl (TRUE METRIX AIR GLUCOSE METER) w/Device KIT 1 Device by Does not apply route daily. Dx E11.9  1 kit 0 Unknown at Unknown time  . celecoxib (CELEBREX) 200 MG capsule Take 200 mg by mouth daily.   02/09/2020 at Unknown time  . glucose blood (TRUE METRIX BLOOD GLUCOSE TEST) test strip Test BS daily Dx E11.9 100 each 3 Unknown at Unknown time  . TRUEplus Lancets 33G MISC Test BS daily Dx E11.9 100 each 3 Unknown at Unknown time    SH: Social History   Tobacco Use  . Smoking status: Former Smoker    Packs/day: 0.50    Years: 25.00    Pack years: 12.50    Types: Cigarettes    Quit date: 02/12/2004    Years since quitting: 16.0  . Smokeless tobacco: Never Used  Vaping Use  . Vaping Use: Never used  Substance Use Topics  . Alcohol use: Yes    Comment: occassional  . Drug use: No    MEDS: Prior to Admission medications   Medication Sig Start Date End Date Taking? Authorizing Provider  amLODipine (NORVASC) 5 MG tablet Take 1 tablet (5 mg total) by mouth daily. 11/15/19  Yes Martin, Mary-Margaret, FNP  benzonatate (TESSALON) 200 MG capsule Take 1 capsule (200 mg total) by mouth 3 (three) times daily as needed. 02/09/20  Yes Ivy Lynn, NP  Cholecalciferol (VITAMIN D3) 1000 UNITS CAPS Take 1,000 Units by mouth daily.   Yes [provider]  cyclobenzaprine (FLEXERIL) 10 MG tablet Take 1 tablet by mouth three times daily as needed for muscle spasm 11/15/19  Yes Hassell Done,  Mary-Margaret, FNP  fluticasone (FLONASE) 50 MCG/ACT nasal spray USE 2 SPRAYS IN EACH NOSTRIL EVERY DAY Patient taking differently: Place 2 sprays into both nostrils daily. 11/25/19  Yes Martin, Mary-Margaret, FNP  gabapentin (NEURONTIN) 300 MG capsule TAKE ONE CAPSULE BY MOUTH ONCE DAILY AT BEDTIME Patient taking differently: Take 300 mg by mouth at bedtime. TAKE ONE CAPSULE BY MOUTH ONCE DAILY AT BEDTIME 11/15/19  Yes Martin, Mary-Margaret, FNP  lisinopril-hydrochlorothiazide (ZESTORETIC) 20-25 MG tablet Take 1 tablet by mouth daily. 11/15/19  Yes Martin, Mary-Margaret, FNP  loratadine (CLARITIN) 10 MG tablet TAKE 1 TABLET EVERY DAY Patient taking differently: Take 10 mg by mouth daily. 12/07/19  Yes Hassell Done, Mary-Margaret, FNP  metFORMIN (GLUCOPHAGE-XR) 500 MG 24 hr tablet Take 1 tablet (500 mg total) by mouth daily with breakfast. 11/15/19  Yes Hassell Done, Mary-Margaret, FNP  naproxen (NAPROSYN) 500 MG tablet Take 1 tablet (500 mg total) by mouth 2 (two) times daily with a meal. 05/17/19  Yes Hassell Done, Mary-Margaret, FNP  omeprazole (PRILOSEC) 40 MG capsule TAKE 1 CAPSULE EVERY MORNING Patient taking differently: Take 40 mg by mouth daily. TAKE 1 CAPSULE EVERY MORNING 11/15/19  Yes Hassell Done, Mary-Margaret, FNP  potassium gluconate 595 (99 K) MG TABS tablet Take 99 mg by mouth daily.   Yes [provider]  promethazine-dextromethorphan (PROMETHAZINE-DM) 6.25-15 MG/5ML syrup TAKE 5 ML BY MOUTH  THREE TIMES DAILY AS NEEDED FOR COUGH 02/03/20  Yes Hassell Done, Mary-Margaret, FNP  sildenafil (REVATIO) 20 MG tablet 1-2 tablets daily prn Patient taking differently: Take 20-40 mg by mouth daily as needed (ED). 1-2 tablets daily prn 05/17/19  Yes Hassell Done, Mary-Margaret, FNP  simvastatin (ZOCOR) 40 MG tablet Take 1 tablet (40 mg total) by mouth at bedtime. 11/15/19  Yes Martin, Mary-Margaret, FNP  sodium chloride (OCEAN) 0.65 % SOLN nasal spray Place 1 spray into both nostrils as needed for congestion. 02/09/20  Yes  Ivy Lynn, NP  acetaminophen (TYLENOL) 500 MG tablet Take 1 tablet (500 mg total)  by mouth every 6 (six) hours as needed. 02/09/20   Ivy Lynn, NP  Alcohol Swabs (B-D SINGLE USE SWABS REGULAR) PADS Test BS daily Dx E11.9 04/19/19   Chevis Pretty, FNP  aspirin 81 MG tablet Take 81 mg by mouth daily.    [provider]  Blood Glucose Calibration (TRUE METRIX LEVEL 1) Low SOLN Test BS daily Dx E11.9 04/19/19   Hassell Done, Mary-Margaret, FNP  Blood Glucose Monitoring Suppl (TRUE METRIX AIR GLUCOSE METER) w/Device KIT 1 Device by Does not apply route daily. Dx E11.9 04/19/19   Hassell Done Mary-Margaret, FNP  celecoxib (CELEBREX) 200 MG capsule Take 200 mg by mouth daily. 11/02/19   [provider]  glucose blood (TRUE METRIX BLOOD GLUCOSE TEST) test strip Test BS daily Dx E11.9 04/19/19   Chevis Pretty, FNP  TRUEplus Lancets 33G MISC Test BS daily Dx E11.9 04/19/19   Chevis Pretty, FNP    ALLERGY: Allergies  Allergen Reactions  . Hydrocodeine [Dihydrocodeine] Nausea And Vomiting    Social History   Tobacco Use  . Smoking status: Former Smoker    Packs/day: 0.50    Years: 25.00    Pack years: 12.50    Types: Cigarettes    Quit date: 02/12/2004    Years since quitting: 16.0  . Smokeless tobacco: Never Used  Substance Use Topics  . Alcohol use: Yes    Comment: occassional     Family History  Problem Relation Age of Onset  . Brain cancer Mother        tumor  . Diabetes Mother   . Kidney Stones Father   . Diabetes Sister   . Heart disease Sister   . GI problems Sister   . Cancer Brother        prostate  . Cancer Brother        lymphoma   . Heart disease Brother   . Blindness Son   . Arthritis Son        trouble with legs  . Heart disease Paternal Uncle   . Arthritis Son        shoulder and back      ROS   ROS  Exam   Vitals:   02/15/20 0710  BP: 119/68  Pulse: 84  Resp: 16  Temp: 98.1 F (36.7 C)  SpO2: 95%   General  appearance: WDWN, NAD Eyes: No scleral injection Cardiovascular: Regular rate and rhythm without murmurs, rubs, gallops. No edema or variciosities. Distal pulses normal. Pulmonary: Effort normal, non-labored breathing Musculoskeletal:     Muscle tone upper extremities: Normal    Muscle tone lower extremities: Normal    Motor exam: Upper Extremities Deltoid Bicep Tricep Grip  Right 5/5 5/5 5/5 5/5  Left 5/5 5/5 5/5 5/5   Lower Extremity IP Quad PF DF EHL  Right 5/5 5/5 5/5 5/5 5/5  Left 5/5 5/5 5/5 5/5 5/5   Neurological Mental Status:    - Patient is awake, alert, oriented to person, place, month, year, and situation    - Patient is able to give a clear and coherent history.    - No signs of aphasia or neglect Cranial Nerves    - II: Visual Fields are full. PERRL    - III/IV/VI: EOMI without ptosis or diploplia.     - V: Facial sensation is grossly normal    - VII: Facial movement is symmetric.     - VIII: hearing is intact to voice    - X: Uvula  elevates symmetrically    - XI: Shoulder shrug is symmetric.    - XII: tongue is midline without atrophy or fasciculations.  Sensory: Sensation grossly intact to LT  Results - Imaging/Labs   Results for orders placed or performed during the hospital encounter of 02/15/20 (from the past 48 hour(s))  Glucose, capillary     Status: None   Collection Time: 02/15/20  7:17 AM  Result Value Ref Range   Glucose-Capillary 96 70 - 99 mg/dL    Comment: Glucose reference range applies only to samples taken after fasting for at least 8 hours.    No results found.  IMAGING: MRI of the cervical spine dated 11/06/2019 was personally reviewed.  This demonstrates previous non-instrumented fusion at C6-7 without residual stenosis.  There is relatively large broad-based disc bulge at C3-4 with spinal cord compression.  There is associated spinal cord signal change from the C3-4 through about the C5 level.  At C4-5 again there is broad-based disc  osteophyte complex with resultant severe bilateral foraminal stenosis, similarly at C5-6 there is mild central and severe bilateral foraminal stenosis.  Impression/Plan   65 y.o. male with symptoms of cervical radiculopathy as well as cervical myelopathy related to relatively large central disc at C3-4 and severe bilateral foraminal stenosis at C4-5 and C5-6.  With his Clinical weakness and radiographic findings I do think surgical decompression is indicated.  We willproceed with anterior cervical diskectomy and fusion at C3-4, C4-5, C5-6 including placement of interbody spacers, use of bone allograft, and anterior plating.  We have reviewed the indications for surgery, the associated risks, benefits and alternatives at length in the office.  All questions today were answered and consent was obtained.  Consuella Lose, MD [facility]

## 2020-02-16 ENCOUNTER — Encounter (HOSPITAL_COMMUNITY): Payer: Self-pay | Admitting: Neurosurgery

## 2020-02-16 DIAGNOSIS — E1140 Type 2 diabetes mellitus with diabetic neuropathy, unspecified: Secondary | ICD-10-CM | POA: Diagnosis not present

## 2020-02-16 DIAGNOSIS — Z87891 Personal history of nicotine dependence: Secondary | ICD-10-CM | POA: Diagnosis not present

## 2020-02-16 DIAGNOSIS — I10 Essential (primary) hypertension: Secondary | ICD-10-CM | POA: Diagnosis not present

## 2020-02-16 DIAGNOSIS — Z7984 Long term (current) use of oral hypoglycemic drugs: Secondary | ICD-10-CM | POA: Diagnosis not present

## 2020-02-16 DIAGNOSIS — E662 Morbid (severe) obesity with alveolar hypoventilation: Secondary | ICD-10-CM | POA: Diagnosis not present

## 2020-02-16 DIAGNOSIS — Z7982 Long term (current) use of aspirin: Secondary | ICD-10-CM | POA: Diagnosis not present

## 2020-02-16 DIAGNOSIS — M5412 Radiculopathy, cervical region: Secondary | ICD-10-CM | POA: Diagnosis not present

## 2020-02-16 DIAGNOSIS — M4802 Spinal stenosis, cervical region: Secondary | ICD-10-CM | POA: Diagnosis not present

## 2020-02-16 DIAGNOSIS — Z6841 Body Mass Index (BMI) 40.0 and over, adult: Secondary | ICD-10-CM | POA: Diagnosis not present

## 2020-02-16 LAB — CBC
HCT: 35.2 % — ABNORMAL LOW (ref 39.0–52.0)
Hemoglobin: 11.6 g/dL — ABNORMAL LOW (ref 13.0–17.0)
MCH: 32.2 pg (ref 26.0–34.0)
MCHC: 33 g/dL (ref 30.0–36.0)
MCV: 97.8 fL (ref 80.0–100.0)
Platelets: 221 10*3/uL (ref 150–400)
RBC: 3.6 MIL/uL — ABNORMAL LOW (ref 4.22–5.81)
RDW: 13.1 % (ref 11.5–15.5)
WBC: 8.9 10*3/uL (ref 4.0–10.5)
nRBC: 0 % (ref 0.0–0.2)

## 2020-02-16 LAB — BASIC METABOLIC PANEL
Anion gap: 12 (ref 5–15)
BUN: 22 mg/dL (ref 8–23)
CO2: 24 mmol/L (ref 22–32)
Calcium: 8.8 mg/dL — ABNORMAL LOW (ref 8.9–10.3)
Chloride: 99 mmol/L (ref 98–111)
Creatinine, Ser: 1.52 mg/dL — ABNORMAL HIGH (ref 0.61–1.24)
GFR, Estimated: 51 mL/min — ABNORMAL LOW (ref >60)
Glucose, Bld: 102 mg/dL — ABNORMAL HIGH (ref 70–99)
Potassium: 4.1 mmol/L (ref 3.5–5.1)
Sodium: 135 mmol/L (ref 135–145)

## 2020-02-16 LAB — GLUCOSE, CAPILLARY: Glucose-Capillary: 98 mg/dL (ref 70–99)

## 2020-02-16 LAB — PROTIME-INR
INR: 1.1 (ref 0.8–1.2)
Prothrombin Time: 13.5 seconds (ref 11.4–15.2)

## 2020-02-16 LAB — APTT: aPTT: 32 seconds (ref 24–36)

## 2020-02-16 MED ORDER — METHOCARBAMOL 750 MG PO TABS: 750.0000 mg | ORAL_TABLET | Freq: Three times a day (TID) | ORAL | 1 refills | Status: DC | PRN

## 2020-02-16 MED ORDER — OXYCODONE-ACETAMINOPHEN 7.5-325 MG PO TABS: 1.0000 | ORAL_TABLET | ORAL | 0 refills | Status: DC | PRN

## 2020-02-16 MED ORDER — ASPIRIN 81 MG PO TABS: 81.0000 mg | ORAL_TABLET | Freq: Every day | ORAL | Status: AC

## 2020-02-16 MED FILL — Thrombin For Soln 5000 Unit: CUTANEOUS | Qty: 5000 | Status: AC

## 2020-02-16 NOTE — Progress Notes (Signed)
Patient alert and oriented, mae's well, voiding adequate amount of urine, swallowing without difficulty, no c/o pain at time of discharge. Patient discharged home with family. Script and discharged instructions given to patient. Patient and family stated understanding of instructions given. Patient has an appointment with Dr. Nundkumar  

## 2020-02-16 NOTE — Evaluation (Signed)
Physical Therapy Evaluation Patient Details Name: Evan Smith MRN: [id number] DOB: Jun 28, 1955 Today's Date: 02/16/2020   History of Present Illness  65 y.o. male presenting with LUE weakness, decreased strength, and decreased ROM and paresthesia in digits 1-3 of R hand. MRI (+) severe stenosis from C3-4 to C5-6 s/p decompression and fusion on 1/4 by Dr. Conchita Paris. PMHx significant for HTN, lumbago with bilateral sciatica, severe obesity, neuropathy, DM, and HLD.    Clinical Impression  Pt admitted with above diagnosis. At the time of PT eval, pt was able to demonstrate transfers and ambulation with gross min guard assist and RW for support. Pt was educated on precautions, brace application/wearing schedule, appropriate activity progression, and car transfer. Pt currently with functional limitations due to the deficits listed below (see PT Problem List). Pt will benefit from skilled PT to increase their independence and safety with mobility to allow discharge to the venue listed below.      Follow Up Recommendations No PT follow up;Supervision for mobility/OOB    Equipment Recommendations  Rolling walker with 5" wheels    Recommendations for Other Services       Precautions / Restrictions Precautions Precautions: Cervical;Fall Precaution Booklet Issued: Yes (comment) Precaution Comments: Verbally reviewed handout and pt was cued for precautions during functional mobility. Required Braces or Orthoses: Cervical Brace Cervical Brace: Hard collar;At all times Restrictions Weight Bearing Restrictions: No      Mobility  Bed Mobility Overal bed mobility: Needs Assistance             General bed mobility comments: Pt was received sitting up in the recliner upon PT arrival.    Transfers Overall transfer level: Needs assistance Equipment used: Rolling walker (2 wheeled) Transfers: Sit to/from [employer] Sit to Stand: Min guard Stand pivot transfers: Min  guard       General transfer comment: VC's for hand placement on seated surface for safety as well as for improved posture.  Ambulation/Gait Ambulation/Gait assistance: Min guard Gait Distance (Feet): 350 Feet Assistive device: Rolling walker (2 wheeled) Gait Pattern/deviations: Step-through pattern;Decreased stride length;Trunk flexed Gait velocity: Decreased Gait velocity interpretation: <1.8 ft/sec, indicate of risk for recurrent falls General Gait Details: VC's for improved posture, closer walker proximity, and forward gaze. Able to make some corrective changes but unable to maintain.  Stairs Stairs: Yes Stairs assistance: Min guard Stair Management: One rail Right;Alternating pattern;Forwards Number of Stairs: 10 General stair comments: VC's for sequencing and general safety. No assist required but close hands on guarding provided.  Wheelchair Mobility    Modified Rankin (Stroke Patients Only)       Balance Overall balance assessment: Needs assistance Sitting-balance support: No upper extremity supported;Feet supported Sitting balance-Leahy Scale: Good Sitting balance - Comments: Able to don UB/LB clothing seated EOB without LOB.   Standing balance support: Single extremity supported;During functional activity Standing balance-Leahy Scale: Poor Standing balance comment: Patient furniture walks. Reports history of falls.                             Pertinent Vitals/Pain Pain Assessment: 0-10 Pain Score: 3  Pain Location: Incision Pain Descriptors / Indicators: Aching;Sore Pain Intervention(s): Limited activity within patient's tolerance;Monitored during session;Repositioned    Home Living Family/patient expects to be discharged to:: Private residence Living Arrangements: Non-relatives/Friends Available Help at Discharge: Friend(s);Available 24 hours/day Type of Home: House Home Access: Stairs to enter Entrance Stairs-Rails: None Entrance  Stairs-Number of Steps: 2 Home  Layout: Two level;Able to live on main level with bedroom/bathroom Home Equipment: Hand held shower head;Shower seat      Prior Function Level of Independence: Independent with assistive device(s)         Comments: Independent with ADLs with use of AE including sock-aid and reacher. Mod I with functional mobility with increased time. Patient reports furniture walking.     Hand Dominance   Dominant Hand: Right    Extremity/Trunk Assessment   Upper Extremity Assessment Upper Extremity Assessment: Defer to OT evaluation RUE Deficits / Details: AROM WFL RUE Sensation: history of peripheral neuropathy RUE Coordination: decreased fine motor LUE Deficits / Details: Limited shoulder flexion, abduction, and elbow flexion. Wrist flexion/extension WFL. LUE Sensation: history of peripheral neuropathy LUE Coordination: decreased fine motor;decreased gross motor    Lower Extremity Assessment Lower Extremity Assessment: Generalized weakness    Cervical / Trunk Assessment Cervical / Trunk Assessment: Other exceptions Cervical / Trunk Exceptions: s/p cervical sx.  Communication   Communication: No difficulties  Cognition Arousal/Alertness: Awake/alert Behavior During Therapy: WFL for tasks assessed/performed Overall Cognitive Status: Within Functional Limits for tasks assessed                                        General Comments General comments (skin integrity, edema, etc.): Clean, dry dressing to anterior cervical neck.    Exercises     Assessment/Plan    PT Assessment Patient needs continued PT services  PT Problem List Decreased strength;Decreased activity tolerance;Decreased balance;Decreased mobility;Decreased knowledge of use of DME;Decreased safety awareness;Decreased knowledge of precautions;Pain       PT Treatment Interventions DME instruction;Gait training;Stair training;Functional mobility training;Therapeutic  activities;Therapeutic exercise;Neuromuscular re-education;Patient/family education    PT Goals (Current goals can be found in the Care Plan section)  Acute Rehab PT Goals Patient Stated Goal: To return home. PT Goal Formulation: With patient Time For Goal Achievement: 02/23/20 Potential to Achieve Goals: Good    Frequency Min 5X/week   Barriers to discharge        Co-evaluation               AM-PAC PT "6 Clicks" Mobility  Outcome Measure Help needed turning from your back to your side while in a flat bed without using bedrails?: None Help needed moving from lying on your back to sitting on the side of a flat bed without using bedrails?: A Little Help needed moving to and from a bed to a chair (including a wheelchair)?: A Little Help needed standing up from a chair using your arms (e.g., wheelchair or bedside chair)?: A Little Help needed to walk in hospital room?: A Little Help needed climbing 3-5 steps with a railing? : A Little 6 Click Score: 19    End of Session Equipment Utilized During Treatment: Gait belt;Cervical collar Activity Tolerance: Patient tolerated treatment well Patient left: in chair;with call bell/phone within reach Nurse Communication: Mobility status PT Visit Diagnosis: Unsteadiness on feet (R26.81);Pain Pain - part of body:  (neck)    Time: 4401-0272 PT Time Calculation (min) (ACUTE ONLY): 19 min   Charges:   PT Evaluation $PT Eval Low Complexity: 1 Low          Conni Slipper, PT, DPT Acute Rehabilitation Services Pager: [phone] Office: [phone]   Marylynn Pearson 02/16/2020, 10:13 AM

## 2020-02-16 NOTE — Evaluation (Signed)
Occupational Therapy Evaluation Patient Details Name: Evan Smith MRN: [id number] DOB: 1955/11/21 Today's Date: 02/16/2020    History of Present Illness 65 y.o. male presenting with LUE weakness, decreased strength, and decreased ROM and paresthesia in digits 1-3 of R hand. MRI (+) severe stenosis from C3-4 to C5-6 s/p decompression and fusion on 1/4 by Dr. Conchita Paris. PMHx significant for HTN, lumbago with bilateral sciatica, severe obesity, neuropathy, DM, and HLD.   Clinical Impression   PTA patient was living in a 2-level private residence with a friend and was Mod I with ADLs/IADLs with use of AE. Patient was ambulating without AD but reports furniture walking at baseline. Patient would benefit from use of RW. Patient currently presents slightly below baseline requiring Min guard for functional transfers and short-distance mobility in hospital room without use of AD. Education provided on cervical precautions, home set-up to maximize safety and independence and compensatory strategies for adherence to precautions. Patient requiring cueing for safety during functional tasks. Patient would benefit from continued acute OT services in prep for safe d/c home with recommendation for OPOT once patient has been cleared by surgeon.     Follow Up Recommendations  Follow surgeon's recommendation for DC plan and follow-up therapies (would benefit from OPOT when cleared to participate.)    Equipment Recommendations  Tub/shower bench    Recommendations for Other Services       Precautions / Restrictions Precautions Precautions: Cervical;Fall Precaution Booklet Issued: Yes (comment) Precaution Comments: Cues for adherence to cervical precautions during functional tasks. Required Braces or Orthoses: Cervical Brace Cervical Brace: Hard collar;At all times Restrictions Weight Bearing Restrictions: No      Mobility Bed Mobility Overal bed mobility: Needs Assistance             General  bed mobility comments: Patient seated EOB upon entry.    Transfers Overall transfer level: Needs assistance Equipment used: None Transfers: Sit to/from [employer] Sit to Stand: Min guard Stand pivot transfers: Min guard       General transfer comment: Min guard for sit to stand from EOB positioned in lowest setting. Min guard for stand-pivot transfer without AD.    Balance Overall balance assessment: Needs assistance Sitting-balance support: No upper extremity supported;Feet supported Sitting balance-Leahy Scale: Good Sitting balance - Comments: Able to don UB/LB clothing seated EOB without LOB.   Standing balance support: Single extremity supported;During functional activity Standing balance-Leahy Scale: Poor Standing balance comment: Patient furniture walks. Reports history of falls.                           ADL either performed or assessed with clinical judgement   ADL Overall ADL's : Needs assistance/impaired     Grooming: Wash/dry hands;Wash/dry face;Oral care;Standing;Min guard Grooming Details (indicate cue type and reason): Min guard 2/2 decreased balance. Cues for adherence to cervical precautions         Upper Body Dressing : Set up;Sitting   Lower Body Dressing: Min guard;Sit to/from stand Lower Body Dressing Details (indicate cue type and reason): Use of AE with cues for adherence to cervical precautions.             Functional mobility during ADLs: Min guard General ADL Comments: Min guard 2/2 decreased balance. Patient would benefit from use of DME to decrease risk of falls.     Vision Baseline Vision/History: Wears glasses Wears Glasses: At all times Patient Visual Report: No change from baseline Vision Assessment?:  No apparent visual deficits     Perception     Praxis      Pertinent Vitals/Pain Pain Assessment: 0-10 Pain Score: 2  Pain Location: Incision Pain Descriptors / Indicators: Aching;Sore Pain  Intervention(s): Limited activity within patient's tolerance;Premedicated before session;Repositioned     Hand Dominance Right   Extremity/Trunk Assessment Upper Extremity Assessment Upper Extremity Assessment: RUE deficits/detail;LUE deficits/detail RUE Deficits / Details: AROM WFL RUE Sensation: history of peripheral neuropathy RUE Coordination: decreased fine motor LUE Deficits / Details: Limited shoulder flexion, abduction, and elbow flexion. Wrist flexion/extension WFL. LUE Sensation: history of peripheral neuropathy LUE Coordination: decreased fine motor;decreased gross motor   Lower Extremity Assessment Lower Extremity Assessment: Defer to PT evaluation   Cervical / Trunk Assessment Cervical / Trunk Assessment: Other exceptions Cervical / Trunk Exceptions: s/p cervical sx.   Communication Communication Communication: No difficulties   Cognition Arousal/Alertness: Awake/alert Behavior During Therapy: WFL for tasks assessed/performed Overall Cognitive Status: Within Functional Limits for tasks assessed                                     General Comments  Clean, dry dressing to anterior cervical neck.    Exercises     Shoulder Instructions      Home Living Family/patient expects to be discharged to:: Private residence Living Arrangements: Non-relatives/Friends Available Help at Discharge: Friend(s);Available 24 hours/day Type of Home: House Home Access: Stairs to enter [employer] of Steps: 2 Entrance Stairs-Rails: None Home Layout: Two level;Able to live on main level with bedroom/bathroom     Bathroom Shower/Tub: Tub/shower unit   Bathroom Toilet: Handicapped height     Home Equipment: Hand held shower head;Shower seat          Prior Functioning/Environment Level of Independence: Independent with assistive device(s)        Comments: Independent with ADLs with use of AE including sock-aid and reacher. Mod I with  functional mobility with increased time. Patient reports furniture walking.        OT Problem List: Decreased strength;Decreased range of motion;Impaired balance (sitting and/or standing);Decreased coordination;Decreased safety awareness;Decreased knowledge of use of DME or AE;Decreased knowledge of precautions;Impaired sensation;Impaired UE functional use      OT Treatment/Interventions: Self-care/ADL training;Therapeutic exercise;Energy conservation;DME and/or AE instruction;Therapeutic activities;Patient/family education;Balance training    OT Goals(Current goals can be found in the care plan section) Acute Rehab OT Goals Patient Stated Goal: To return home. OT Goal Formulation: With patient Time For Goal Achievement: 03/01/20 Potential to Achieve Goals: Good ADL Goals Additional ADL Goal #1: Patient will recall and demonstrate 3/3 spinal precautions during functional tasks without cueing. Additional ADL Goal #2: Patient will perform written BUE HEP with focus on cooridnation in prep for ADLs.  OT Frequency: Min 2X/week   Barriers to D/C:            Co-evaluation              AM-PAC OT "6 Clicks" Daily Activity     Outcome Measure Help from another person eating meals?: None Help from another person taking care of personal grooming?: A Little Help from another person toileting, which includes using toliet, bedpan, or urinal?: A Little Help from another person bathing (including washing, rinsing, drying)?: A Little Help from another person to put on and taking off regular upper body clothing?: None Help from another person to put on and taking off regular lower body  clothing?: A Little 6 Click Score: 20   End of Session Equipment Utilized During Treatment: Gait belt Nurse Communication: Mobility status  Activity Tolerance: Patient tolerated treatment well Patient left: in chair;with call bell/phone within reach  OT Visit Diagnosis: Unsteadiness on feet  (R26.81);Repeated falls (R29.6);Muscle weakness (generalized) (M62.81);Pain Pain - part of body:  (cervical neck)                Time: 2458-0998 OT Time Calculation (min): 30 min Charges:  OT General Charges $OT Visit: 1 Visit OT Evaluation $OT Eval Moderate Complexity: 1 Mod OT Treatments $Self Care/Home Management : 8-22 mins  Jatavian Calica H. OTR/L Supplemental OT, Department of rehab services [phone]  Ruchi Stoney R H. 02/16/2020, 8:25 AM

## 2020-02-16 NOTE — Anesthesia Postprocedure Evaluation (Signed)
Anesthesia Post Note  Patient: Evan Smith  Procedure(s) Performed: ANTERIOR CERVICAL DECOMPRESSON FUSION CERVICAL THREE-FOUR, CERVICAL FOUR-FIVE,CERVICAL FIVE-SIX. (N/A Spine Cervical)     Patient location during evaluation: PACU Anesthesia Type: General Level of consciousness: awake and alert Pain management: pain level controlled Vital Signs Assessment: post-procedure vital signs reviewed and stable Respiratory status: spontaneous breathing, nonlabored ventilation, respiratory function stable and patient connected to nasal cannula oxygen Cardiovascular status: blood pressure returned to baseline and stable Postop Assessment: no apparent nausea or vomiting Anesthetic complications: no   No complications documented.  Last Vitals:  Vitals:   02/16/20 0346 02/16/20 0724  BP: 128/80 (!) 119/95  Pulse: 80 78  Resp: 20 18  Temp: 36.7 C 36.6 C  SpO2: 99% 100%    Last Pain:  Vitals:   02/16/20 0724  TempSrc: Oral  PainSc:                  Banner Huckaba S

## 2020-02-16 NOTE — Discharge Summary (Signed)
Physician Discharge Summary  Patient ID: Evan Smith MRN: [id number] DOB/AGE: [birth year] 65 y.o.  Admit date: 02/15/2020 Discharge date: 02/16/2020  Admission Diagnoses:  Cervical stenosis with myelopathy  Discharge Diagnoses:  Same Active Problems:   Stenosis of cervical spine with myelopathy [facility])   Discharged Condition: Stable  Hospital Course:  Evan Smith is a 65 y.o. male who was admitted for the below procedure. There were no post operative complications. At time of discharge, pain was well controlled, ambulating with Pt/OT, tolerating po, voiding normal. Ready for discharge.  Treatments: Surgery C3-4 C4-5 C5-6 ACDF  Discharge Exam: Blood pressure (!) 119/95, pulse 78, temperature 97.8 F (36.6 C), temperature source Oral, resp. rate 18, height '5\' 8"'  (1.727 m), weight 131.6 kg, SpO2 100 %. Awake, alert, oriented Speech fluent, appropriate CN grossly intact Stable motor Wound c/d/i  Disposition: Discharge disposition: 01-Home or Self Care       Discharge Instructions    Call MD for:  difficulty breathing, headache or visual disturbances   Complete by: As directed    Call MD for:  persistant dizziness or light-headedness   Complete by: As directed    Call MD for:  redness, tenderness, or signs of infection (pain, swelling, redness, odor or green/yellow discharge around incision site)   Complete by: As directed    Call MD for:  severe uncontrolled pain   Complete by: As directed    Call MD for:  temperature >100.4   Complete by: As directed    Diet - low sodium heart healthy   Complete by: As directed    Driving Restrictions   Complete by: As directed    Do not drive until given clearance.   Increase activity slowly   Complete by: As directed    Lifting restrictions   Complete by: As directed    Do not lift anything >10lbs. Avoid bending and twisting in awkward positions. Avoid bending at the back.   May shower / Bathe   Complete by: As  directed    In 24 hours. Okay to wash wound with warm soapy water. Avoid scrubbing the wound. Pat dry.   Remove dressing in 48 hours   Complete by: As directed      Allergies as of 02/16/2020      Reactions   Hydrocodeine [dihydrocodeine] Nausea And Vomiting      Medication List    TAKE these medications   acetaminophen 500 MG tablet Commonly known as: TYLENOL Take 1 tablet (500 mg total) by mouth every 6 (six) hours as needed.   amLODipine 5 MG tablet Commonly known as: NORVASC Take 1 tablet (5 mg total) by mouth daily.   aspirin 81 MG tablet Take 1 tablet (81 mg total) by mouth daily. Start taking on: February 21, 2020 What changed: These instructions start on February 21, 2020. If you are unsure what to do until then, ask your doctor or other care provider.   B-D SINGLE USE SWABS REGULAR Pads Test BS daily Dx E11.9   benzonatate 200 MG capsule Commonly known as: TESSALON Take 1 capsule (200 mg total) by mouth 3 (three) times daily as needed.   celecoxib 200 MG capsule Commonly known as: CELEBREX Take 200 mg by mouth daily.   cyclobenzaprine 10 MG tablet Commonly known as: FLEXERIL Take 1 tablet by mouth three times daily as needed for muscle spasm   fluticasone 50 MCG/ACT nasal spray Commonly known as: FLONASE USE 2 SPRAYS IN EACH NOSTRIL EVERY DAY  gabapentin 300 MG capsule Commonly known as: NEURONTIN TAKE ONE CAPSULE BY MOUTH ONCE DAILY AT BEDTIME What changed:   how much to take  how to take this  when to take this   lisinopril-hydrochlorothiazide 20-25 MG tablet Commonly known as: ZESTORETIC Take 1 tablet by mouth daily.   loratadine 10 MG tablet Commonly known as: CLARITIN TAKE 1 TABLET EVERY DAY   metFORMIN 500 MG 24 hr tablet Commonly known as: GLUCOPHAGE-XR Take 1 tablet (500 mg total) by mouth daily with breakfast.   methocarbamol 750 MG tablet Commonly known as: Robaxin-750 Take 1 tablet (750 mg total) by mouth 3 (three) times daily  as needed for muscle spasms.   naproxen 500 MG tablet Commonly known as: NAPROSYN Take 1 tablet (500 mg total) by mouth 2 (two) times daily with a meal.   omeprazole 40 MG capsule Commonly known as: PRILOSEC TAKE 1 CAPSULE EVERY MORNING What changed:   how much to take  how to take this  when to take this   oxyCODONE-acetaminophen 7.5-325 MG tablet Commonly known as: Percocet Take 1 tablet by mouth every 4 (four) hours as needed for severe pain.   potassium gluconate 595 (99 K) MG Tabs tablet Take 99 mg by mouth daily.   promethazine-dextromethorphan 6.25-15 MG/5ML syrup Commonly known as: PROMETHAZINE-DM TAKE 5 ML BY MOUTH  THREE TIMES DAILY AS NEEDED FOR COUGH   sildenafil 20 MG tablet Commonly known as: REVATIO 1-2 tablets daily prn What changed:   how much to take  how to take this  when to take this  reasons to take this   simvastatin 40 MG tablet Commonly known as: ZOCOR Take 1 tablet (40 mg total) by mouth at bedtime.   sodium chloride 0.65 % Soln nasal spray Commonly known as: OCEAN Place 1 spray into both nostrils as needed for congestion.   True Metrix Air Glucose Meter w/Device Kit 1 Device by Does not apply route daily. Dx E11.9   True Metrix Blood Glucose Test test strip Generic drug: glucose blood Test BS daily Dx E11.9   True Metrix Level 1 Low Soln Test BS daily Dx E11.9   TRUEplus Lancets 33G Misc Test BS daily Dx E11.9   Vitamin D3 25 MCG (1000 UT) Caps Take 1,000 Units by mouth daily.       Follow-up Information    Consuella Lose, MD. Schedule an appointment as soon as possible for a visit in 3 week(s).   Specialty: Neurosurgery Contact information: [address] [place] [address] [phone]               Signed: Traci Sermon 02/16/2020, 9:34 AM

## 2020-02-16 NOTE — Care Management Important Message (Deleted)
Important Message  Patient Details  Name: Evan Smith MRN: [id number] Date of Birth: [birth year]   Medicare Important Message Given:  Yes     Renie Ora 02/16/2020, 8:54 AM

## 2020-02-18 ENCOUNTER — Ambulatory Visit: Payer: Self-pay | Admitting: Nurse Practitioner

## 2020-02-23 DIAGNOSIS — G4733 Obstructive sleep apnea (adult) (pediatric): Secondary | ICD-10-CM | POA: Diagnosis not present

## 2020-02-28 ENCOUNTER — Other Ambulatory Visit: Payer: Self-pay

## 2020-02-28 ENCOUNTER — Ambulatory Visit (INDEPENDENT_AMBULATORY_CARE_PROVIDER_SITE_OTHER): Payer: Medicare HMO

## 2020-02-28 DIAGNOSIS — Z0000 Encounter for general adult medical examination without abnormal findings: Secondary | ICD-10-CM | POA: Diagnosis not present

## 2020-02-28 NOTE — Progress Notes (Signed)
MEDICARE ANNUAL WELLNESS VISIT  02/28/2020  Telephone Visit Disclaimer This Medicare AWV was conducted by telephone due to national recommendations for restrictions regarding the COVID-19 Pandemic (e.g. social distancing).  I verified, using two identifiers, that I am speaking with Evan Smith or their authorized healthcare agent. I discussed the limitations, risks, security, and privacy concerns of performing an evaluation and management service by telephone and the potential availability of an in-person appointment in the future. The patient expressed understanding and agreed to proceed.  Location of Patient: Home Location of Provider (nurse):  [facility]  Subjective:    Evan Smith is a 65 y.o. male patient of Chevis Pretty, [place] who had a Medicare Annual Wellness Visit today via telephone. Evan Smith is Disabled and lives alone. He has two children and five grandchildren. He reports that he is socially active and does interact with friends/family regularly. He is minimally physically active and enjoys doing Neurosurgeon and tinkering with motors in his spare time.  Patient Care Team: Chevis Pretty, FNP as PCP - General (Family Medicine) Armandina Gemma, MD as Consulting Physician (General Surgery) Danie Binder, MD (Inactive) as Consulting Physician (Gastroenterology) Andris Flurry, MD (Orthopedic Surgery) Jovita Gamma, MD as Consulting Physician (Neurosurgery) Leeroy Cha, MD as Attending Physician (Neurosurgery)  Advanced Directives 02/28/2020 02/15/2020 02/09/2020 08/18/2018 03/27/2017 10/14/2016 03/21/2016  Does Patient Have a Medical Advance Directive? _0  No No  Would patient like information on creating a medical advance directive? No - Patient declined No - Patient declined Yes (MAU/Ambulatory/Procedural Areas - Information given) Yes (MAU/Ambulatory/Procedural Areas - Information given) Yes (MAU/Ambulatory/Procedural Areas - Information  given) - Yes (MAU/Ambulatory/Procedural Areas - Information given)  Pre-existing out of facility DNR order (yellow form or pink MOST form) - - - - - - -    Hospital Utilization Over the Past 12 Months: # of hospitalizations or ER visits: 1 # of surgeries: 1  Review of Systems    Patient reports that his overall health is unchanged compared to last year.  History obtained from chart review and the patient  Patient Reported Readings (BP, Pulse, CBG, Weight, etc) none  Pain Assessment Pain : No/denies pain   Patient had recent neck surgery but reports that he is doing well.  Is only taking Tylenol for pain.     Current Medications & Allergies (verified) Allergies as of 02/28/2020      Reactions   Hydrocodeine [dihydrocodeine] Nausea And Vomiting      Medication List       Accurate as of February 28, 2020  8:38 AM. If you have any questions, ask your nurse or doctor.        acetaminophen 500 MG tablet Commonly known as: TYLENOL Take 1 tablet (500 mg total) by mouth every 6 (six) hours as needed.   amLODipine 5 MG tablet Commonly known as: NORVASC Take 1 tablet (5 mg total) by mouth daily.   aspirin 81 MG tablet Take 1 tablet (81 mg total) by mouth daily.   B-D SINGLE USE SWABS REGULAR Pads Test BS daily Dx E11.9   benzonatate 200 MG capsule Commonly known as: TESSALON Take 1 capsule (200 mg total) by mouth 3 (three) times daily as needed.   celecoxib 200 MG capsule Commonly known as: CELEBREX Take 200 mg by mouth daily.   cyclobenzaprine 10 MG tablet Commonly known as: FLEXERIL Take 1 tablet by mouth three times daily as needed for muscle spasm   fluticasone  50 MCG/ACT nasal spray Commonly known as: FLONASE USE 2 SPRAYS IN EACH NOSTRIL EVERY DAY   gabapentin 300 MG capsule Commonly known as: NEURONTIN TAKE ONE CAPSULE BY MOUTH ONCE DAILY AT BEDTIME What changed:   how much to take  how to take this  when to take this    lisinopril-hydrochlorothiazide 20-25 MG tablet Commonly known as: ZESTORETIC Take 1 tablet by mouth daily.   loratadine 10 MG tablet Commonly known as: CLARITIN TAKE 1 TABLET EVERY DAY   metFORMIN 500 MG 24 hr tablet Commonly known as: GLUCOPHAGE-XR Take 1 tablet (500 mg total) by mouth daily with breakfast.   methocarbamol 750 MG tablet Commonly known as: Robaxin-750 Take 1 tablet (750 mg total) by mouth 3 (three) times daily as needed for muscle spasms.   naproxen 500 MG tablet Commonly known as: NAPROSYN Take 1 tablet (500 mg total) by mouth 2 (two) times daily with a meal.   omeprazole 40 MG capsule Commonly known as: PRILOSEC TAKE 1 CAPSULE EVERY MORNING What changed:   how much to take  how to take this  when to take this   oxyCODONE-acetaminophen 7.5-325 MG tablet Commonly known as: Percocet Take 1 tablet by mouth every 4 (four) hours as needed for severe pain.   potassium gluconate 595 (99 K) MG Tabs tablet Take 99 mg by mouth daily.   promethazine-dextromethorphan 6.25-15 MG/5ML syrup Commonly known as: PROMETHAZINE-DM TAKE 5 ML BY MOUTH  THREE TIMES DAILY AS NEEDED FOR COUGH   sildenafil 20 MG tablet Commonly known as: REVATIO 1-2 tablets daily prn What changed:   how much to take  how to take this  when to take this  reasons to take this   simvastatin 40 MG tablet Commonly known as: ZOCOR Take 1 tablet (40 mg total) by mouth at bedtime.   sodium chloride 0.65 % Soln nasal spray Commonly known as: OCEAN Place 1 spray into both nostrils as needed for congestion.   True Metrix Air Glucose Meter w/Device Kit 1 Device by Does not apply route daily. Dx E11.9   True Metrix Blood Glucose Test test strip Generic drug: glucose blood Test BS daily Dx E11.9   True Metrix Level 1 Low Soln Test BS daily Dx E11.9   TRUEplus Lancets 33G Misc Test BS daily Dx E11.9   Vitamin D3 25 MCG (1000 UT) Caps Take 1,000 Units by mouth daily.        History (reviewed): Past Medical History:  Diagnosis Date  . Allergic rhinitis    by referral on 11/02/19/  . Arthritis   . Cataract   . Chronic left shoulder pain    From [facility] received 11/02/19/  . DDD (degenerative disc disease)   . Dyspnea    on exertion   . GERD (gastroesophageal reflux disease)   . Hyperlipidemia   . Hypertension   . Intestinal flu 2000  . OSA on CPAP    MODERATE OSA PER STUDY 2010  . Perianal fistula   . PONV (postoperative nausea and vomiting)    and hear to wake  . Severe obesity (BMI >= 40) ([place])    Received from [facility] on 11/02/19/  . Type 2 diabetes mellitus with diabetic polyneuropathy, without long-term current use of insulin (HCC)    pt. states he's borderline   Past Surgical History:  Procedure Laterality Date  . ANTERIOR CERVICAL DECOMP/DISCECTOMY FUSION N/A 02/15/2020   Procedure: ANTERIOR CERVICAL DECOMPRESSON FUSION CERVICAL THREE-FOUR, CERVICAL FOUR-FIVE,CERVICAL FIVE-SIX.;  Surgeon: Consuella Lose, MD;  Location: [facility];  Service: Neurosurgery;  Laterality: N/A;  anterior  . CERVICAL FUSION  1990's  . EVALUATION UNDER ANESTHESIA WITH ANAL FISTULECTOMY N/A 08/03/2012   Procedure: EXAM UNDER ANESTHESIA WITH ANAL FISTULECTOMY ;  Surgeon: Leighton Ruff, MD;  Location: [facility];  Service: General;  Laterality: N/A;  . INCISION AND DRAINAGE PERIRECTAL ABSCESS  02/13/2012   Procedure: IRRIGATION AND DEBRIDEMENT PERIRECTAL ABSCESS;  Surgeon: Earnstine Regal, MD;  Location: [facility];  Service: General;  Laterality: N/A;  . LUMBAR FUSION  2010   has had 2 low back surgeries   . RIGHT HYDROCELECTOMY  07-28-2001  . SHOULDER ARTHROSCOPY WITH ROTATOR CUFF REPAIR Left 64's   Family History  Problem Relation Age of Onset  . Brain cancer Mother        tumor  . Diabetes Mother   . Kidney Stones Father   . Diabetes Sister   . Heart disease Sister   . GI problems Sister   . Cancer Brother        prostate  . Cancer Brother         lymphoma   . Heart disease Brother   . Blindness Son   . Arthritis Son        trouble with legs  . Heart disease Paternal Uncle   . Arthritis Son        shoulder and back    Social History   Socioeconomic History  . Marital status: Divorced    Spouse name: Not on file  . Number of children: 2  . Years of education: Not on file  . Highest education level: 11th grade  Occupational History  . Occupation: Disabled    Comment: [employer] -  - disability   Tobacco Use  . Smoking status: Former Smoker    Packs/day: 0.50    Years: 25.00    Pack years: 12.50    Types: Cigarettes    Quit date: 02/12/2004    Years since quitting: 16.0  . Smokeless tobacco: Never Used  Vaping Use  . Vaping Use: Never used  Substance and Sexual Activity  . Alcohol use: Yes    Comment: occassional  . Drug use: No  . Sexual activity: Yes  Other Topics Concern  . Not on file  Social History Narrative   Lives by self   No caffeine, very seldom   Right handed   Social Determinants of Health   Financial Resource Strain: Not on file  Food Insecurity: Not on file  Transportation Needs: Not on file  Physical Activity: Not on file  Stress: Not on file  Social Connections: Not on file    Activities of Daily Living In your present state of health, do you have any difficulty performing the following activities: 02/28/2020 02/09/2020  Hearing? N N  Vision? N N  Difficulty concentrating or making decisions? N N  Walking or climbing stairs? N Y  Dressing or bathing? N N  Doing errands, shopping? N N  Preparing Food and eating ? N -  Using the Toilet? N -  In the past six months, have you accidently leaked urine? N -  Do you have problems with loss of bowel control? N -  Managing your Medications? N -  Managing your Finances? N -  Housekeeping or managing your Housekeeping? N -  Some recent data might be hidden    Patient Education/ Literacy How often do you need to have someone help you  when you read  instructions, pamphlets, or other written materials from your doctor or pharmacy?: 1 - Never What is the last grade level you completed in school?: 11th grade  Exercise Current Exercise Habits: The patient does not participate in regular exercise at present, Exercise limited by: orthopedic condition(s)  Diet Patient reports consuming 2 meals a day and 1 snack(s) a day Patient reports that his primary diet is: Regular Patient reports that he does have regular access to food.   Depression Screen PHQ 2/9 Scores 02/28/2020 11/15/2019 08/19/2019 05/17/2019 11/12/2018 09/23/2018 09/08/2018  PHQ - 2 Score 0 0 0 0 0 0 0     Fall Risk Fall Risk  02/28/2020 11/15/2019 08/19/2019 05/17/2019 11/12/2018  Falls in the past year? 0 0 0 1 0  Number falls in past yr: - - - 0 -  Injury with Fall? - - - 0 -  Risk for fall due to : - - - - -  Risk for fall due to: Comment - - - - -  Follow up Falls evaluation completed - - - -     Objective:  Evan Smith seemed alert and oriented and he participated appropriately during our telephone visit.  Blood Pressure Weight BMI  BP Readings from Last 3 Encounters:  02/16/20 (!) 119/95  02/09/20 (!) 129/57  11/15/19 119/80   Wt Readings from Last 3 Encounters:  02/15/20 290 lb 2 oz (131.6 kg)  02/09/20 290 lb 1.6 oz (131.6 kg)  11/15/19 290 lb (131.5 kg)   BMI Readings from Last 1 Encounters:  02/15/20 44.11 kg/m    *Unable to obtain current vital signs, weight, and BMI due to telephone visit type  Hearing/Vision  . Evan Smith did not seem to have difficulty with hearing/understanding during the telephone conversation . Reports that he has not had a formal eye exam by an eye care professional within the past year . Reports that he has not had a formal hearing evaluation within the past year *Unable to fully assess hearing and vision during telephone visit type  Cognitive Function: 6CIT Screen 02/28/2020 08/18/2018  What Year? - 0 points  What month?  - 0 points  What time? 0 points 0 points  Count back from 20 0 points 0 points  Months in reverse 0 points 0 points  Repeat phrase 2 points 0 points  Total Score - 0   (Normal:0-7, Significant for Dysfunction: >8)  Normal Cognitive Function Screening: Yes   Immunization & Health Maintenance Record Immunization History  Administered Date(s) Administered  . Fluad Quad(high Dose 65+) 11/15/2019  . Influenza,inj,Quad PF,6+ Mos 11/24/2013, 01/13/2015, 11/24/2015, 11/26/2016, 12/02/2017, 11/12/2018  . Moderna Sars-Covid-2 Vaccination 03/28/2019, 04/25/2019  . Pneumococcal Conjugate-13 12/02/2017  . Pneumococcal Polysaccharide-23 08/19/2019    Health Maintenance  Topic Date Due  . COVID-19 Vaccine (3 - Booster for Moderna series) 10/26/2019  . TETANUS/TDAP  11/14/2020 (Originally 05/26/2016)  . HEMOGLOBIN A1C  08/09/2020  . FOOT EXAM  11/14/2020  . OPHTHALMOLOGY EXAM  01/11/2021  . Fecal DNA (Cologuard)  07/06/2022  . INFLUENZA VACCINE  Completed  . Hepatitis C Screening  Completed  . HIV Screening  Completed  . PNA vac Low Risk Adult  Completed       Assessment  This is a routine wellness examination for Evan Smith.  Health Maintenance: Due or Overdue Health Maintenance Due  Topic Date Due  . COVID-19 Vaccine (3 - Booster for Moderna series) 10/26/2019    Evan Smith does not need a referral for  Community Assistance: Care Management:   no Social Work:    no Prescription Assistance:  no Nutrition/Diabetes Education:  no   Plan:  Personalized Goals Goals Addressed            This Visit's Progress   . Patient Stated       02/28/2020 AWV Goal: Fall Prevention  . Over the next year, patient will decrease their risk for falls by: o Using assistive devices, such as a cane or walker, as needed o Identifying fall risks within their home and correcting them by: - Removing throw rugs - Adding handrails to stairs or ramps - Removing clutter and keeping a  clear pathway throughout the home - Increasing light, especially at night - Adding shower handles/bars - Raising toilet seat o Identifying potential personal risk factors for falls: - Medication side effects - Incontinence/urgency - Vestibular dysfunction - Hearing loss - Musculoskeletal disorders - Neurological disorders - Orthostatic hypotension        Personalized Health Maintenance & Screening Recommendations  Td vaccine Colorectal cancer screening Glaucoma screening  Lung Cancer Screening Recommended: no (Low Dose CT Chest recommended if Age 4-80 years, 30 pack-year currently smoking OR have quit w/in past 15 years) Hepatitis C Screening recommended: no HIV Screening recommended: no  Advanced Directives: Written information was not prepared per patient's request.  Referrals & Orders No orders of the defined types were placed in this encounter.   Follow-up Plan . Follow-up with Chevis Pretty, FNP as planned . Schedule colonoscopy . Tdap vaccine . Shingrix vaccine    I have personally reviewed and noted the following in the patient's chart:   . Medical and social history . Use of alcohol, tobacco or illicit drugs  . Current medications and supplements . Functional ability and status . Nutritional status . Physical activity . Advanced directives . List of other physicians . Hospitalizations, surgeries, and ER visits in previous 12 months . Vitals . Screenings to include cognitive, depression, and falls . Referrals and appointments  In addition, I have reviewed and discussed with Evan Smith certain preventive protocols, quality metrics, and best practice recommendations. A written personalized care plan for preventive services as well as general preventive health recommendations is available and can be mailed to the patient at his request.      Felicity Coyer, LPN    [id number]/[id number]   Patient declined after visit summary

## 2020-03-03 ENCOUNTER — Other Ambulatory Visit: Payer: Self-pay | Admitting: Nurse Practitioner

## 2020-03-03 DIAGNOSIS — M25512 Pain in left shoulder: Secondary | ICD-10-CM

## 2020-03-06 ENCOUNTER — Ambulatory Visit (INDEPENDENT_AMBULATORY_CARE_PROVIDER_SITE_OTHER): Payer: Medicare HMO | Admitting: Nurse Practitioner

## 2020-03-06 ENCOUNTER — Encounter: Payer: Self-pay | Admitting: Nurse Practitioner

## 2020-03-06 ENCOUNTER — Other Ambulatory Visit: Payer: Self-pay

## 2020-03-06 VITALS — BP 101/60 | HR 97 | Temp 98.0°F | Resp 20 | Ht 68.0 in | Wt 284.0 lb

## 2020-03-06 DIAGNOSIS — E782 Mixed hyperlipidemia: Secondary | ICD-10-CM

## 2020-03-06 DIAGNOSIS — M5441 Lumbago with sciatica, right side: Secondary | ICD-10-CM | POA: Diagnosis not present

## 2020-03-06 DIAGNOSIS — K2100 Gastro-esophageal reflux disease with esophagitis, without bleeding: Secondary | ICD-10-CM

## 2020-03-06 DIAGNOSIS — G629 Polyneuropathy, unspecified: Secondary | ICD-10-CM

## 2020-03-06 DIAGNOSIS — E1142 Type 2 diabetes mellitus with diabetic polyneuropathy: Secondary | ICD-10-CM | POA: Diagnosis not present

## 2020-03-06 DIAGNOSIS — I10 Essential (primary) hypertension: Secondary | ICD-10-CM | POA: Diagnosis not present

## 2020-03-06 DIAGNOSIS — G4733 Obstructive sleep apnea (adult) (pediatric): Secondary | ICD-10-CM

## 2020-03-06 DIAGNOSIS — N5201 Erectile dysfunction due to arterial insufficiency: Secondary | ICD-10-CM | POA: Diagnosis not present

## 2020-03-06 DIAGNOSIS — E119 Type 2 diabetes mellitus without complications: Secondary | ICD-10-CM

## 2020-03-06 DIAGNOSIS — G8929 Other chronic pain: Secondary | ICD-10-CM

## 2020-03-06 DIAGNOSIS — M4802 Spinal stenosis, cervical region: Secondary | ICD-10-CM | POA: Diagnosis not present

## 2020-03-06 DIAGNOSIS — M5442 Lumbago with sciatica, left side: Secondary | ICD-10-CM

## 2020-03-06 MED ORDER — GABAPENTIN 300 MG PO CAPS: ORAL_CAPSULE | ORAL | 0 refills | Status: DC

## 2020-03-06 MED ORDER — LISINOPRIL-HYDROCHLOROTHIAZIDE 20-25 MG PO TABS: 1.0000 | ORAL_TABLET | Freq: Every day | ORAL | 1 refills | Status: DC

## 2020-03-06 MED ORDER — AMLODIPINE BESYLATE 5 MG PO TABS
5.0000 mg | ORAL_TABLET | Freq: Every day | ORAL | 1 refills | Status: DC
Start: 2020-03-06 — End: 2020-06-13

## 2020-03-06 MED ORDER — SIMVASTATIN 40 MG PO TABS: 40.0000 mg | ORAL_TABLET | Freq: Every day | ORAL | 1 refills | Status: DC

## 2020-03-06 MED ORDER — SILDENAFIL CITRATE 20 MG PO TABS
20.0000 mg | ORAL_TABLET | Freq: Every day | ORAL | 1 refills | Status: DC | PRN
Start: 2020-03-06 — End: 2020-03-10

## 2020-03-06 MED ORDER — METFORMIN HCL ER 500 MG PO TB24: 500.0000 mg | ORAL_TABLET | Freq: Every day | ORAL | 1 refills | Status: DC

## 2020-03-06 MED ORDER — OMEPRAZOLE 40 MG PO CPDR
DELAYED_RELEASE_CAPSULE | ORAL | 1 refills | Status: DC
Start: 2020-03-06 — End: 2020-06-13

## 2020-03-06 NOTE — Progress Notes (Signed)
Subjective:    Patient ID: Evan Smith, male    DOB: 12/15/1955, 65 y.o.   MRN: [id number]   Chief Complaint: Medical Management of Chronic Issues    HPI:  1. Essential hypertension No c/o chest pain, sob or headache. Does not check his blood pressure at home. BP Readings from Last 3 Encounters:  03/06/20 101/60  02/16/20 (!) 119/95  02/09/20 (!) 129/57     2. Type 2 diabetes mellitus with diabetic polyneuropathy, without long-term current use of insulin (HCC) Fasting blood sugars are running in 90's most of the time. He denies any low blood sugars. Lab Results  Component Value Date   HGBA1C 5.7 (H) 02/09/2020     3. Mixed hyperlipidemia He is trying to watch diet. Is not able ot do much exercise due to chronic back pain. Lab Results  Component Value Date   CHOL 143 11/15/2019   HDL 43 11/15/2019   LDLCALC 55 11/15/2019   TRIG 286 (H) 11/15/2019   CHOLHDL 3.3 11/15/2019     4. Chronic midline low back pain with bilateral sciatica Has chronic back pain. Is walking with a cane.  5. Obstructive sleep apnea syndrome Wears his cpap nightly. Sleeps around 6-8 hours a night but waits up frequently.  6. Severe obesity (BMI >= 40) (HCC) Weight is down 6 lbs from previous Wt Readings from Last 3 Encounters:  03/06/20 284 lb (128.8 kg)  02/15/20 290 lb 2 oz (131.6 kg)  02/09/20 290 lb 1.6 oz (131.6 kg)   BMI Readings from Last 3 Encounters:  03/06/20 43.18 kg/m  02/15/20 44.11 kg/m  02/09/20 44.11 kg/m       Outpatient Encounter Medications as of 03/06/2020  Medication Sig  . acetaminophen (TYLENOL) 500 MG tablet Take 1 tablet (500 mg total) by mouth every 6 (six) hours as needed.  . Alcohol Swabs (B-D SINGLE USE SWABS REGULAR) PADS Test BS daily Dx E11.9  . amLODipine (NORVASC) 5 MG tablet Take 1 tablet (5 mg total) by mouth daily.  Marland Kitchen aspirin 81 MG tablet Take 1 tablet (81 mg total) by mouth daily.  . benzonatate (TESSALON) 200 MG capsule Take 1  capsule (200 mg total) by mouth 3 (three) times daily as needed.  . Blood Glucose Calibration (TRUE METRIX LEVEL 1) Low SOLN Test BS daily Dx E11.9  . Blood Glucose Monitoring Suppl (TRUE METRIX AIR GLUCOSE METER) w/Device KIT 1 Device by Does not apply route daily. Dx E11.9  . celecoxib (CELEBREX) 200 MG capsule Take 200 mg by mouth daily.  . Cholecalciferol (VITAMIN D3) 1000 UNITS CAPS Take 1,000 Units by mouth daily.  . cyclobenzaprine (FLEXERIL) 10 MG tablet Take 1 tablet by mouth three times daily as needed for muscle spasm  . fluticasone (FLONASE) 50 MCG/ACT nasal spray USE 2 SPRAYS IN EACH NOSTRIL EVERY DAY (Patient taking differently: Place 2 sprays into both nostrils daily.)  . gabapentin (NEURONTIN) 300 MG capsule TAKE ONE CAPSULE BY MOUTH ONCE DAILY AT BEDTIME (Patient taking differently: Take 300 mg by mouth at bedtime. TAKE ONE CAPSULE BY MOUTH ONCE DAILY AT BEDTIME)  . glucose blood (TRUE METRIX BLOOD GLUCOSE TEST) test strip Test BS daily Dx E11.9  . lisinopril-hydrochlorothiazide (ZESTORETIC) 20-25 MG tablet Take 1 tablet by mouth daily.  Marland Kitchen loratadine (CLARITIN) 10 MG tablet TAKE 1 TABLET EVERY DAY (Patient taking differently: Take 10 mg by mouth daily.)  . metFORMIN (GLUCOPHAGE-XR) 500 MG 24 hr tablet Take 1 tablet (500 mg total) by mouth daily  with breakfast.  . naproxen (NAPROSYN) 500 MG tablet TAKE 1 TABLET TWICE DAILY WITH MEALS  . omeprazole (PRILOSEC) 40 MG capsule TAKE 1 CAPSULE EVERY MORNING (Patient taking differently: Take 40 mg by mouth daily. TAKE 1 CAPSULE EVERY MORNING)  . potassium gluconate 595 (99 K) MG TABS tablet Take 99 mg by mouth daily.  . sildenafil (REVATIO) 20 MG tablet 1-2 tablets daily prn (Patient taking differently: Take 20-40 mg by mouth daily as needed (ED). 1-2 tablets daily prn)  . simvastatin (ZOCOR) 40 MG tablet Take 1 tablet (40 mg total) by mouth at bedtime.  . sodium chloride (OCEAN) 0.65 % SOLN nasal spray Place 1 spray into both nostrils as  needed for congestion.  . TRUEplus Lancets 33G MISC Test BS daily Dx E11.9  . [DISCONTINUED] methocarbamol (ROBAXIN-750) 750 MG tablet Take 1 tablet (750 mg total) by mouth 3 (three) times daily as needed for muscle spasms.  . [DISCONTINUED] oxyCODONE-acetaminophen (PERCOCET) 7.5-325 MG tablet Take 1 tablet by mouth every 4 (four) hours as needed for severe pain.  . [DISCONTINUED] promethazine-dextromethorphan (PROMETHAZINE-DM) 6.25-15 MG/5ML syrup TAKE 5 ML BY MOUTH  THREE TIMES DAILY AS NEEDED FOR COUGH   No facility-administered encounter medications on file as of 03/06/2020.    Past Surgical History:  Procedure Laterality Date  . ANTERIOR CERVICAL DECOMP/DISCECTOMY FUSION N/A 02/15/2020   Procedure: ANTERIOR CERVICAL DECOMPRESSON FUSION CERVICAL THREE-FOUR, CERVICAL FOUR-FIVE,CERVICAL FIVE-SIX.;  Surgeon: Consuella Lose, MD;  Location: [facility];  Service: Neurosurgery;  Laterality: N/A;  anterior  . CERVICAL FUSION  1990's  . EVALUATION UNDER ANESTHESIA WITH ANAL FISTULECTOMY N/A 08/03/2012   Procedure: EXAM UNDER ANESTHESIA WITH ANAL FISTULECTOMY ;  Surgeon: Leighton Ruff, MD;  Location: [facility];  Service: General;  Laterality: N/A;  . INCISION AND DRAINAGE PERIRECTAL ABSCESS  02/13/2012   Procedure: IRRIGATION AND DEBRIDEMENT PERIRECTAL ABSCESS;  Surgeon: Earnstine Regal, MD;  Location: [facility];  Service: General;  Laterality: N/A;  . LUMBAR FUSION  2010   has had 2 low back surgeries   . RIGHT HYDROCELECTOMY  07-28-2001  . SHOULDER ARTHROSCOPY WITH ROTATOR CUFF REPAIR Left 21's    Family History  Problem Relation Age of Onset  . Brain cancer Mother        tumor  . Diabetes Mother   . Kidney Stones Father   . Diabetes Sister   . Heart disease Sister   . GI problems Sister   . Cancer Brother        prostate  . Cancer Brother        lymphoma   . Heart disease Brother   . Blindness Son   . Arthritis Son        trouble with legs  . Heart disease Paternal Uncle    . Arthritis Son        shoulder and back     New complaints: None today- he had neck surgery since last visit and his pain in his neck has resolves  Social history: Lives by hisself  Controlled substance contract: n/a    Review of Systems  Constitutional: Negative for diaphoresis.  Eyes: Negative for pain.  Respiratory: Negative for shortness of breath.   Cardiovascular: Negative for chest pain, palpitations and leg swelling.  Gastrointestinal: Negative for abdominal pain.  Endocrine: Negative for polydipsia.  Musculoskeletal: Positive for back pain (chronic).  Skin: Negative for rash.  Neurological: Negative for dizziness, weakness and headaches.  Hematological: Does not bruise/bleed easily.  All other systems  reviewed and are negative.      Objective:   Physical Exam Vitals and nursing note reviewed.  Constitutional:      Appearance: Normal appearance. He is well-developed and well-nourished.  HENT:     Head: Normocephalic.     Nose: Nose normal.     Mouth/Throat:     Mouth: Oropharynx is clear and moist.  Eyes:     Extraocular Movements: EOM normal.     Pupils: Pupils are equal, round, and reactive to light.  Neck:     Thyroid: No thyroid mass or thyromegaly.     Vascular: No carotid bruit or JVD.     Trachea: Phonation normal.  Cardiovascular:     Rate and Rhythm: Normal rate and regular rhythm.  Pulmonary:     Effort: Pulmonary effort is normal. No respiratory distress.     Breath sounds: Normal breath sounds.  Abdominal:     General: Bowel sounds are normal. Aorta is normal.     Palpations: Abdomen is soft.     Tenderness: There is no abdominal tenderness.  Musculoskeletal:        General: Normal range of motion.     Cervical back: Normal range of motion and neck supple.  Lymphadenopathy:     Cervical: No cervical adenopathy.  Skin:    General: Skin is warm and dry.  Neurological:     Mental Status: He is alert and oriented to person, place, and  time.  Psychiatric:        Mood and Affect: Mood and affect normal.        Behavior: Behavior normal.        Thought Content: Thought content normal.        Judgment: Judgment normal.     BP 101/60   Pulse 97   Temp 98 F (36.7 C) (Temporal)   Resp 20   Ht 5' 8" (1.727 m)   Wt 284 lb (128.8 kg)   BMI 43.18 kg/m        Assessment & Plan:  Evan Smith comes in today with chief complaint of Medical Management of Chronic Issues   Diagnosis and orders addressed:  1. Essential hypertension Low sodium diet - amLODipine (NORVASC) 5 MG tablet; Take 1 tablet (5 mg total) by mouth daily.  Dispense: 90 tablet; Refill: 1 - lisinopril-hydrochlorothiazide (ZESTORETIC) 20-25 MG tablet; Take 1 tablet by mouth daily.  Dispense: 90 tablet; Refill: 1  2. Type 2 diabetes mellitus with diabetic polyneuropathy, without long-term current use of insulin (HCC) Continue to watch carbs in diet - metFORMIN (GLUCOPHAGE-XR) 500 MG 24 hr tablet; Take 1 tablet (500 mg total) by mouth daily with breakfast.  Dispense: 90 tablet; Refill: 1   3. Mixed hyperlipidemia Low fat diet - simvastatin (ZOCOR) 40 MG tablet; Take 1 tablet (40 mg total) by mouth at bedtime.  Dispense: 90 tablet; Refill: 1  4. Chronic midline low back pain with bilateral sciatica Back stretches Continue to use cane for stability  5. Obstructive sleep apnea syndrome Continue cpap nightly  6. Severe obesity (BMI >= 40) (HCC) Discussed diet and exercise for person with BMI >25 Will recheck weight in 3-6 months  7. Erectile dysfunction due to arterial insufficiency  - sildenafil (REVATIO) 20 MG tablet; Take 1-2 tablets (20-40 mg total) by mouth daily as needed (ED). 1-2 tablets daily prn  Dispense: 30 tablet; Refill: 1  8. Gastroesophageal reflux disease with esophagitis Avoid spicy foods Do not eat 2 hours prior to  bedtime - omeprazole (PRILOSEC) 40 MG capsule; TAKE 1 CAPSULE EVERY MORNING  Dispense: 90 capsule; Refill:  1  10. Neuropathy Do not go barefooted - gabapentin (NEURONTIN) 300 MG capsule; TAKE ONE CAPSULE BY MOUTH ONCE DAILY AT BEDTIME  Dispense: 90 capsule; Refill: 0   Labs pending Health Maintenance reviewed Diet and exercise encouraged  Follow up plan: 3 months   Mary-Margaret Hassell Done, FNP

## 2020-03-06 NOTE — Patient Instructions (Signed)
Diabetes Mellitus and Foot Care Foot care is an important part of your health, especially when you have diabetes. Diabetes may cause you to have problems because of poor blood flow (circulation) to your feet and legs, which can cause your skin to:  Become thinner and drier.  Break more easily.  Heal more slowly.  Peel and crack. You may also have nerve damage (neuropathy) in your legs and feet, causing decreased feeling in them. This means that you may not notice minor injuries to your feet that could lead to more serious problems. Noticing and addressing any potential problems early is the best way to prevent future foot problems. How to care for your feet Foot hygiene  Wash your feet daily with warm water and mild soap. Do not use hot water. Then, pat your feet and the areas between your toes until they are completely dry. Do not soak your feet as this can dry your skin.  Trim your toenails straight across. Do not dig under them or around the cuticle. File the edges of your nails with an emery board or nail file.  Apply a moisturizing lotion or petroleum jelly to the skin on your feet and to dry, brittle toenails. Use lotion that does not contain alcohol and is unscented. Do not apply lotion between your toes.   Shoes and socks  Wear clean socks or stockings every day. Make sure they are not too tight. Do not wear knee-high stockings since they may decrease blood flow to your legs.  Wear shoes that fit properly and have enough cushioning. Always look in your shoes before you put them on to be sure there are no objects inside.  To break in new shoes, wear them for just a few hours a day. This prevents injuries on your feet. Wounds, scrapes, corns, and calluses  Check your feet daily for blisters, cuts, bruises, sores, and redness. If you cannot see the bottom of your feet, use a mirror or ask someone for help.  Do not cut corns or calluses or try to remove them with medicine.  If you  find a minor scrape, cut, or break in the skin on your feet, keep it and the skin around it clean and dry. You may clean these areas with mild soap and water. Do not clean the area with peroxide, alcohol, or iodine.  If you have a wound, scrape, corn, or callus on your foot, look at it several times a day to make sure it is healing and not infected. Check for: ? Redness, swelling, or pain. ? Fluid or blood. ? Warmth. ? Pus or a bad smell.   General tips  Do not cross your legs. This may decrease blood flow to your feet.  Do not use heating pads or hot water bottles on your feet. They may burn your skin. If you have lost feeling in your feet or legs, you may not know this is happening until it is too late.  Protect your feet from hot and cold by wearing shoes, such as at the beach or on hot pavement.  Schedule a complete foot exam at least once a year (annually) or more often if you have foot problems. Report any cuts, sores, or bruises to your health care provider immediately. Where to find more information  American Diabetes Association: www.diabetes.org  Association of Diabetes Care & Education Specialists: www.diabeteseducator.org Contact a health care provider if:  You have a medical condition that increases your risk of infection and   you have any cuts, sores, or bruises on your feet.  You have an injury that is not healing.  You have redness on your legs or feet.  You feel burning or tingling in your legs or feet.  You have pain or cramps in your legs and feet.  Your legs or feet are numb.  Your feet always feel cold.  You have pain around any toenails. Get help right away if:  You have a wound, scrape, corn, or callus on your foot and: ? You have pain, swelling, or redness that gets worse. ? You have fluid or blood coming from the wound, scrape, corn, or callus. ? Your wound, scrape, corn, or callus feels warm to the touch. ? You have pus or a bad smell coming from  the wound, scrape, corn, or callus. ? You have a fever. ? You have a red line going up your leg. Summary  Check your feet every day for blisters, cuts, bruises, sores, and redness.  Apply a moisturizing lotion or petroleum jelly to the skin on your feet and to dry, brittle toenails.  Wear shoes that fit properly and have enough cushioning.  If you have foot problems, report any cuts, sores, or bruises to your health care provider immediately.  Schedule a complete foot exam at least once a year (annually) or more often if you have foot problems. This information is not intended to replace advice given to you by your health care provider. Make sure you discuss any questions you have with your health care provider. Document Revised: 08/19/2019 Document Reviewed: 08/19/2019 Elsevier Patient Education  2021 Elsevier Inc.  

## 2020-03-10 ENCOUNTER — Telehealth: Payer: Self-pay | Admitting: *Deleted

## 2020-03-10 DIAGNOSIS — N5201 Erectile dysfunction due to arterial insufficiency: Secondary | ICD-10-CM

## 2020-03-10 MED ORDER — SILDENAFIL CITRATE 20 MG PO TABS: 20.0000 mg | ORAL_TABLET | Freq: Every day | ORAL | 1 refills | Status: DC | PRN

## 2020-03-10 NOTE — Telephone Encounter (Signed)
Sildenafil PA came in on cover my meds - pt not found on cover my meds with info given, info verified in Epic and is correct.  Will contact pt to check again ins card as well.  Pt agrees this Number is correct.  He usually pays OOP and gets from [facility] - will send there

## 2020-03-25 DIAGNOSIS — G4733 Obstructive sleep apnea (adult) (pediatric): Secondary | ICD-10-CM | POA: Diagnosis not present

## 2020-04-10 ENCOUNTER — Other Ambulatory Visit: Payer: Self-pay | Admitting: Nurse Practitioner

## 2020-04-17 DIAGNOSIS — H5213 Myopia, bilateral: Secondary | ICD-10-CM | POA: Diagnosis not present

## 2020-04-20 ENCOUNTER — Other Ambulatory Visit: Payer: Self-pay | Admitting: Nurse Practitioner

## 2020-04-20 DIAGNOSIS — J309 Allergic rhinitis, unspecified: Secondary | ICD-10-CM

## 2020-04-22 DIAGNOSIS — G4733 Obstructive sleep apnea (adult) (pediatric): Secondary | ICD-10-CM | POA: Diagnosis not present

## 2020-05-10 ENCOUNTER — Other Ambulatory Visit: Payer: Self-pay | Admitting: Nurse Practitioner

## 2020-05-10 DIAGNOSIS — N5201 Erectile dysfunction due to arterial insufficiency: Secondary | ICD-10-CM

## 2020-05-15 ENCOUNTER — Other Ambulatory Visit: Payer: Self-pay | Admitting: Nurse Practitioner

## 2020-05-15 DIAGNOSIS — M25512 Pain in left shoulder: Secondary | ICD-10-CM

## 2020-05-16 DIAGNOSIS — G4733 Obstructive sleep apnea (adult) (pediatric): Secondary | ICD-10-CM | POA: Diagnosis not present

## 2020-05-22 ENCOUNTER — Other Ambulatory Visit: Payer: Self-pay | Admitting: Nurse Practitioner

## 2020-05-24 ENCOUNTER — Other Ambulatory Visit: Payer: Self-pay | Admitting: Family

## 2020-06-06 ENCOUNTER — Ambulatory Visit: Payer: Self-pay | Admitting: Nurse Practitioner

## 2020-06-13 ENCOUNTER — Other Ambulatory Visit: Payer: Self-pay

## 2020-06-13 ENCOUNTER — Encounter: Payer: Self-pay | Admitting: Nurse Practitioner

## 2020-06-13 ENCOUNTER — Ambulatory Visit (INDEPENDENT_AMBULATORY_CARE_PROVIDER_SITE_OTHER): Payer: Medicare HMO | Admitting: Nurse Practitioner

## 2020-06-13 VITALS — BP 114/72 | HR 73 | Temp 98.1°F | Resp 20 | Ht 68.0 in | Wt 286.0 lb

## 2020-06-13 DIAGNOSIS — N5201 Erectile dysfunction due to arterial insufficiency: Secondary | ICD-10-CM

## 2020-06-13 DIAGNOSIS — G629 Polyneuropathy, unspecified: Secondary | ICD-10-CM

## 2020-06-13 DIAGNOSIS — I10 Essential (primary) hypertension: Secondary | ICD-10-CM | POA: Diagnosis not present

## 2020-06-13 DIAGNOSIS — E1142 Type 2 diabetes mellitus with diabetic polyneuropathy: Secondary | ICD-10-CM

## 2020-06-13 DIAGNOSIS — M4802 Spinal stenosis, cervical region: Secondary | ICD-10-CM | POA: Diagnosis not present

## 2020-06-13 DIAGNOSIS — Z125 Encounter for screening for malignant neoplasm of prostate: Secondary | ICD-10-CM

## 2020-06-13 DIAGNOSIS — E782 Mixed hyperlipidemia: Secondary | ICD-10-CM | POA: Diagnosis not present

## 2020-06-13 DIAGNOSIS — G4733 Obstructive sleep apnea (adult) (pediatric): Secondary | ICD-10-CM

## 2020-06-13 DIAGNOSIS — K2100 Gastro-esophageal reflux disease with esophagitis, without bleeding: Secondary | ICD-10-CM

## 2020-06-13 DIAGNOSIS — G992 Myelopathy in diseases classified elsewhere: Secondary | ICD-10-CM

## 2020-06-13 LAB — BAYER DCA HB A1C WAIVED: HB A1C (BAYER DCA - WAIVED): 5.5 % (ref <7.0)

## 2020-06-13 MED ORDER — METFORMIN HCL ER 500 MG PO TB24: 500.0000 mg | ORAL_TABLET | Freq: Every day | ORAL | 1 refills | Status: DC

## 2020-06-13 MED ORDER — AMLODIPINE BESYLATE 5 MG PO TABS: 5.0000 mg | ORAL_TABLET | Freq: Every day | ORAL | 1 refills | Status: DC

## 2020-06-13 MED ORDER — SILDENAFIL CITRATE 20 MG PO TABS: ORAL_TABLET | ORAL | 1 refills | Status: DC

## 2020-06-13 MED ORDER — LISINOPRIL-HYDROCHLOROTHIAZIDE 20-25 MG PO TABS: 1.0000 | ORAL_TABLET | Freq: Every day | ORAL | 1 refills | Status: DC

## 2020-06-13 MED ORDER — OMEPRAZOLE 40 MG PO CPDR: DELAYED_RELEASE_CAPSULE | ORAL | 1 refills | Status: DC

## 2020-06-13 MED ORDER — GABAPENTIN 300 MG PO CAPS: ORAL_CAPSULE | ORAL | 0 refills | Status: DC

## 2020-06-13 MED ORDER — SIMVASTATIN 40 MG PO TABS: 40.0000 mg | ORAL_TABLET | Freq: Every day | ORAL | 1 refills | Status: DC

## 2020-06-13 NOTE — Patient Instructions (Signed)
Diabetic Neuropathy Diabetic neuropathy refers to nerve damage that is caused by diabetes. Over time, people with diabetes can develop nerve damage throughout the body. There are several types of diabetic neuropathy:  Peripheral neuropathy. This is the most common type of diabetic neuropathy. It damages the nerves that carry signals between the spinal cord and other parts of the body (peripheral nerves). This usually affects nerves in the feet, legs, hands, and arms.  Autonomic neuropathy. This type causes damage to nerves that control involuntary functions (autonomic nerves). Involuntary functions are functions of the body that you do not control. They include heartbeat, body temperature, blood pressure, urination, digestion, sweating, sexual function, or response to changes in blood glucose.  Focal neuropathy. This type of nerve damage affects one area of the body, such as an arm, a leg, or the face. The injury may involve one nerve or a small group of nerves. Focal neuropathy can be painful and unpredictable. It occurs most often in older adults with diabetes. This often develops suddenly, but usually improves over time and does not cause long-term problems.  Proximal neuropathy. This type of nerve damage affects the nerves of the thighs, hips, buttocks, or legs. It causes severe pain, weakness, and muscle death (atrophy), usually in the thigh muscles. It is more common among older men and people who have type 2 diabetes. The length of recovery time may vary. What are the causes? Peripheral, autonomic, and focal neuropathies are caused by diabetes that is not well controlled with treatment. The cause of proximal neuropathy is not known, but it may be caused by inflammation related to uncontrolled blood glucose levels. What are the signs or symptoms? Peripheral neuropathy Peripheral neuropathy develops slowly over time. When the nerves of the feet and legs no longer work, you may  experience:  Burning, stabbing, or aching pain in the legs or feet.  Pain or cramping in the legs or feet.  Loss of feeling (numbness) and inability to feel pressure or pain in the feet. This can lead to: ? Thick calluses or sores on areas of constant pressure. ? Ulcers. ? Reduced ability to feel temperature changes.  Foot deformities.  Muscle weakness.  Loss of balance or coordination. Autonomic neuropathy The symptoms of autonomic neuropathy vary depending on which nerves are affected. Symptoms may include:  Problems with digestion, such as: ? Nausea or vomiting. ? Poor appetite. ? Bloating. ? Diarrhea or constipation. ? Trouble swallowing. ? Losing weight without trying to.  Problems with the heart, blood, and lungs, such as: ? Dizziness, especially when standing up. ? Fainting. ? Shortness of breath. ? Irregular heartbeat.  Bladder problems, such as: ? Trouble starting or stopping urination. ? Leaking urine. ? Trouble emptying the bladder. ? Urinary tract infections (UTIs).  Problems with other body functions, such as: ? Sweat. You may sweat too much or too little. ? Temperature. You might get hot easily. Or, you might feel cold more than usual. ? Sexual function. Men may not be able to get or maintain an erection. Women may have vaginal dryness and difficulty with arousal. Focal neuropathy Symptoms affect only one area of the body. Common symptoms include:  Numbness.  Tingling.  Burning pain.  Prickling feeling.  Very sensitive skin.  Weakness.  Inability to move (paralysis).  Muscle twitching.  Muscles getting smaller (wasting).  Poor coordination.  Double or blurred vision. Proximal neuropathy  Sudden, severe pain in the hip, thigh, or buttocks. Pain may spread from the back into the legs (  sciatica).  Pain and numbness in the arms and legs.  Tingling.  Loss of bladder control or bowel control.  Weakness and wasting of thigh  muscles.  Difficulty getting up from a seated position.  Abdominal swelling.  Unexplained weight loss. How is this diagnosed? Diagnosis varies depending on the type of neuropathy your health care provider suspects. Peripheral neuropathy Your health care provider will do a neurologic exam. This exam checks your reflexes, how you move, and what you can feel. You may have other tests, such as:  Blood tests.  Tests of the fluid that surrounds the spinal cord (lumbar puncture).  CT scan.  MRI.  Checking the nerves that control muscles (electromyogram, or EMG).  Checking how quickly signals pass through your nerves (nerve conduction study).  Checking a small piece of a nerve using a microscope (biopsy). Autonomic neuropathy You may have tests, such as:  Tests to measure your blood pressure and heart rate. You may be secured to an exam table that moves you from a lying position to an upright position (table tilt test).  Breathing tests to check your lungs.  Tests to check how food moves through the digestive system (gastric emptying tests).  Blood, sweat, or urine tests.  Ultrasound of your bladder.  Spinal fluid tests. Focal neuropathy This condition may be diagnosed with:  A neurologic exam.  CT scan.  MRI.  EMG.  Nerve conduction study. Proximal neuropathy There is no test to diagnose this type of neuropathy. You may have tests to rule out other possible causes of this type of neuropathy. Tests may include:  X-rays of your spine and lumbar region.  Lumbar puncture.  MRI. How is this treated? The goal of treatment is to keep nerve damage from getting worse. Treatment may include:  Following your diabetes management plan. This will help keep your blood glucose level and your A1C level within your target range. This is the most important treatment.  Using prescription pain medicine. Follow these instructions at home: Diabetes management Follow your diabetes  management plan as told by your health care provider.  Check your blood glucose levels.  Keep your blood glucose in your target range.  Have your A1C level checked at least two times a year, or as often as told.  Take over the counter and prescription medicines only as told by your health care provider. This includes insulin and diabetes medicine.   Lifestyle  Do not use any products that contain nicotine or tobacco, such as cigarettes, e-cigarettes, and chewing tobacco. If you need help quitting, ask your health care provider.  Be physically active every day. Include strength training and balance exercises.  Follow a healthy meal plan.  Work with your health care provider to manage your blood pressure.   General instructions  Ask your health care provider if the medicine prescribed to you requires you to avoid driving or using machinery.  Check your skin and feet every day for cuts, bruises, redness, blisters, or sores.  Keep all follow-up visits. This is important. Contact a health care provider if:  You have burning, stabbing, or aching pain in your legs or feet.  You are unable to feel pressure or pain in your feet.  You develop problems with digestion, such as: ? Nausea. ? Vomiting. ? Bloating. ? Constipation. ? Diarrhea. ? Abdominal pain.  You have difficulty with urination, such as: ? Inability to control when you urinate (incontinence). ? Inability to completely empty the bladder (retention).  You feel   as if your heart is racing (palpitations).  You feel dizzy, weak, or faint when you stand up. Get help right away if:  You cannot urinate.  You have sudden weakness or loss of coordination.  You have trouble speaking.  You have pain or pressure in your chest.  You have an irregular heartbeat.  You have sudden inability to move a part of your body. These symptoms may represent a serious problem that is an emergency. Do not wait to see if the symptoms  will go away. Get medical help right away. Call your local emergency services (911 in the [place] Do not drive yourself to the hospital. Summary  Diabetic neuropathy is nerve damage that is caused by diabetes. It can cause numbness and pain in the arms, legs, digestive tract, heart, and other body systems.  This condition is treated by keeping your blood glucose level and your A1C level within your target range. This can help prevent neuropathy from getting worse.  Check your skin and feet every day for cuts, bruises, redness, blisters, or sores.  Do not use any products that contain nicotine or tobacco, such as cigarettes, e-cigarettes, and chewing tobacco. This information is not intended to replace advice given to you by your health care provider. Make sure you discuss any questions you have with your health care provider. Document Revised: 06/10/2019 Document Reviewed: 06/10/2019 Elsevier Patient Education  2021 Elsevier Inc.  
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
## 2020-06-14 LAB — CBC WITH DIFFERENTIAL/PLATELET
Basophils Absolute: 0 10*3/uL (ref 0.0–0.2)
Basos: 1 %
EOS (ABSOLUTE): 0.1 10*3/uL (ref 0.0–0.4)
Eos: 2 %
Hematocrit: 41.6 % (ref 37.5–51.0)
Hemoglobin: 14.1 g/dL (ref 13.0–17.7)
Immature Grans (Abs): 0 10*3/uL (ref 0.0–0.1)
Immature Granulocytes: 0 %
Lymphocytes Absolute: 1 10*3/uL (ref 0.7–3.1)
Lymphs: 33 %
MCH: 33 pg (ref 26.6–33.0)
MCHC: 33.9 g/dL (ref 31.5–35.7)
MCV: 97 fL (ref 79–97)
Monocytes Absolute: 0.4 10*3/uL (ref 0.1–0.9)
Monocytes: 13 %
Neutrophils Absolute: 1.6 10*3/uL (ref 1.4–7.0)
Neutrophils: 51 %
Platelets: 161 10*3/uL (ref 150–450)
RBC: 4.27 x10E6/uL (ref 4.14–5.80)
RDW: 13.4 % (ref 11.6–15.4)
WBC: 3 10*3/uL — ABNORMAL LOW (ref 3.4–10.8)

## 2020-06-14 LAB — LIPID PANEL
Chol/HDL Ratio: 2.8 ratio (ref 0.0–5.0)
Cholesterol, Total: 147 mg/dL (ref 100–199)
HDL: 52 mg/dL (ref >39)
LDL Chol Calc (NIH): 75 mg/dL (ref 0–99)
Triglycerides: 110 mg/dL (ref 0–149)
VLDL Cholesterol Cal: 20 mg/dL (ref 5–40)

## 2020-06-14 LAB — PSA, TOTAL AND FREE
PSA, Free Pct: 40 %
PSA, Free: 0.16 ng/mL
Prostate Specific Ag, Serum: 0.4 ng/mL (ref 0.0–4.0)

## 2020-06-14 LAB — CMP14+EGFR
ALT: 17 IU/L (ref 0–44)
AST: 20 IU/L (ref 0–40)
Albumin/Globulin Ratio: 1.8 (ref 1.2–2.2)
Albumin: 4.4 g/dL (ref 3.8–4.8)
Alkaline Phosphatase: 72 IU/L (ref 44–121)
BUN/Creatinine Ratio: 12 (ref 10–24)
BUN: 15 mg/dL (ref 8–27)
Bilirubin Total: 0.3 mg/dL (ref 0.0–1.2)
CO2: 25 mmol/L (ref 20–29)
Calcium: 9 mg/dL (ref 8.6–10.2)
Chloride: 98 mmol/L (ref 96–106)
Creatinine, Ser: 1.3 mg/dL — ABNORMAL HIGH (ref 0.76–1.27)
Globulin, Total: 2.4 g/dL (ref 1.5–4.5)
Glucose: 99 mg/dL (ref 65–99)
Potassium: 4 mmol/L (ref 3.5–5.2)
Sodium: 138 mmol/L (ref 134–144)
Total Protein: 6.8 g/dL (ref 6.0–8.5)
eGFR: 61 mL/min/{1.73_m2} (ref >59)

## 2020-06-21 ENCOUNTER — Other Ambulatory Visit: Payer: Self-pay | Admitting: Nurse Practitioner

## 2020-06-21 ENCOUNTER — Other Ambulatory Visit: Payer: Self-pay | Admitting: Family

## 2020-06-21 DIAGNOSIS — N5201 Erectile dysfunction due to arterial insufficiency: Secondary | ICD-10-CM

## 2020-07-14 ENCOUNTER — Telehealth: Payer: Self-pay

## 2020-07-14 NOTE — Telephone Encounter (Signed)
Followed up on order for screening CT of chest. Per referrals patient did not qualify because he didn't have 30+ pack years of smoking.

## 2020-07-17 DIAGNOSIS — M4802 Spinal stenosis, cervical region: Secondary | ICD-10-CM | POA: Diagnosis not present

## 2020-07-17 DIAGNOSIS — I10 Essential (primary) hypertension: Secondary | ICD-10-CM | POA: Diagnosis not present

## 2020-07-17 DIAGNOSIS — Z6841 Body Mass Index (BMI) 40.0 and over, adult: Secondary | ICD-10-CM | POA: Diagnosis not present

## 2020-07-19 ENCOUNTER — Other Ambulatory Visit: Payer: Self-pay | Admitting: Nurse Practitioner

## 2020-07-19 DIAGNOSIS — M25512 Pain in left shoulder: Secondary | ICD-10-CM

## 2020-08-01 ENCOUNTER — Other Ambulatory Visit: Payer: Self-pay | Admitting: Nurse Practitioner

## 2020-08-01 DIAGNOSIS — N5201 Erectile dysfunction due to arterial insufficiency: Secondary | ICD-10-CM

## 2020-08-15 DIAGNOSIS — G4733 Obstructive sleep apnea (adult) (pediatric): Secondary | ICD-10-CM | POA: Diagnosis not present

## 2020-09-05 ENCOUNTER — Telehealth: Payer: Self-pay | Admitting: Nurse Practitioner

## 2020-09-05 NOTE — Telephone Encounter (Signed)
Letter written and left up front for patient pick up. Patient notified 

## 2020-09-13 ENCOUNTER — Other Ambulatory Visit: Payer: Self-pay | Admitting: Nurse Practitioner

## 2020-09-13 DIAGNOSIS — J309 Allergic rhinitis, unspecified: Secondary | ICD-10-CM

## 2020-09-18 ENCOUNTER — Other Ambulatory Visit: Payer: Self-pay | Admitting: Nurse Practitioner

## 2020-09-18 DIAGNOSIS — G629 Polyneuropathy, unspecified: Secondary | ICD-10-CM

## 2020-10-02 ENCOUNTER — Other Ambulatory Visit: Payer: Self-pay | Admitting: Nurse Practitioner

## 2020-10-02 DIAGNOSIS — N5201 Erectile dysfunction due to arterial insufficiency: Secondary | ICD-10-CM

## 2020-10-19 ENCOUNTER — Other Ambulatory Visit: Payer: Self-pay | Admitting: Nurse Practitioner

## 2020-10-19 DIAGNOSIS — E782 Mixed hyperlipidemia: Secondary | ICD-10-CM

## 2020-10-19 DIAGNOSIS — M25512 Pain in left shoulder: Secondary | ICD-10-CM

## 2020-11-15 DIAGNOSIS — G4733 Obstructive sleep apnea (adult) (pediatric): Secondary | ICD-10-CM | POA: Diagnosis not present

## 2020-11-20 ENCOUNTER — Ambulatory Visit
Admission: EM | Admit: 2020-11-20 | Discharge: 2020-11-20 | Disposition: A | Discharge status: Home / Self Care | Payer: Medicare HMO | Attending: Urgent Care | Admitting: Urgent Care

## 2020-11-20 ENCOUNTER — Other Ambulatory Visit: Payer: Self-pay

## 2020-11-20 DIAGNOSIS — I10 Essential (primary) hypertension: Secondary | ICD-10-CM

## 2020-11-20 DIAGNOSIS — E119 Type 2 diabetes mellitus without complications: Secondary | ICD-10-CM

## 2020-11-20 DIAGNOSIS — J069 Acute upper respiratory infection, unspecified: Secondary | ICD-10-CM

## 2020-11-20 DIAGNOSIS — Z1152 Encounter for screening for COVID-19: Secondary | ICD-10-CM | POA: Diagnosis not present

## 2020-11-20 MED ORDER — BENZONATATE 100 MG PO CAPS: 100.0000 mg | ORAL_CAPSULE | Freq: Three times a day (TID) | ORAL | 0 refills | Status: DC | PRN

## 2020-11-20 MED ORDER — CETIRIZINE HCL 10 MG PO TABS: 10.0000 mg | ORAL_TABLET | Freq: Every day | ORAL | 0 refills | Status: AC

## 2020-11-20 MED ORDER — PROMETHAZINE-DM 6.25-15 MG/5ML PO SYRP: 5.0000 mL | ORAL_SOLUTION | Freq: Every evening | ORAL | 0 refills | Status: DC | PRN

## 2020-11-20 MED ORDER — PSEUDOEPHEDRINE HCL 30 MG PO TABS: 30.0000 mg | ORAL_TABLET | Freq: Three times a day (TID) | ORAL | 0 refills | Status: DC | PRN

## 2020-11-20 NOTE — ED Triage Notes (Signed)
Pt presents with cough, congestion, generally feeling unwell.  Pt potentially exposed to COVID last week.

## 2020-11-20 NOTE — Discharge Instructions (Addendum)
We will notify you of your COVID-19 test results as they arrive and may take between 48-72 hours.  I encourage you to sign up for MyChart if you have not already done so as this can be the easiest way for us to communicate results to you online or through a phone app.  Generally, we only contact you if it is a positive COVID result.  In the meantime, if you develop worsening symptoms including fever, chest pain, shortness of breath despite our current treatment plan then please report to the emergency room as this may be a sign of worsening status from possible COVID-19 infection.  Otherwise, we will manage this as a viral syndrome. For sore throat or cough try using a honey-based tea. Use 3 teaspoons of honey with juice squeezed from half lemon. Place shaved pieces of ginger into 1/2-1 cup of water and warm over stove top. Then mix the ingredients and repeat every 4 hours as needed. Please take Tylenol 500mg-650mg every 6 hours for aches and pains, fevers. Hydrate very well with at least 2 liters of water. Eat light meals such as soups to replenish electrolytes and soft fruits, veggies. Start an antihistamine like Zyrtec for postnasal drainage, sinus congestion.  You can take this together with pseudoephedrine (Sudafed) at a dose of 30 mg 2-3 times a day as needed for the same kind of congestion.    

## 2020-11-20 NOTE — ED Provider Notes (Signed)
[facility]   MRN: [id number] DOB: 30-Sep-1955  Subjective:   Evan Smith is a 65 y.o. male presenting for acute onset this morning of malaise, fatigue, sinus congestion and coughing.  Patient has a history of allergic rhinitis but does not take anything consistently for this.  He has a history of high blood pressure and diabetes treated without insulin.  Denies chest pain, shortness of breath, wheezing, body aches.  Had possible COVID exposure last week.  No current facility-administered medications for this encounter.  Current Outpatient Medications:    Alcohol Swabs (B-D SINGLE USE SWABS REGULAR) PADS, USE AS DIRECTED, Disp: 100 each, Rfl: 3   amLODipine (NORVASC) 5 MG tablet, Take 1 tablet (5 mg total) by mouth daily., Disp: 90 tablet, Rfl: 1   aspirin 81 MG tablet, Take 1 tablet (81 mg total) by mouth daily., Disp: 30 tablet, Rfl:    Blood Glucose Calibration (TRUE METRIX LEVEL 1) Low SOLN, USE AS DIRECTED, Disp: 1 each, Rfl: 3   Blood Glucose Monitoring Suppl (TRUE METRIX AIR GLUCOSE METER) w/Device KIT, 1 Device by Does not apply route daily. Dx E11.9, Disp: 1 kit, Rfl: 0   celecoxib (CELEBREX) 200 MG capsule, Take 200 mg by mouth daily., Disp: , Rfl:    Cholecalciferol (VITAMIN D3) 1000 UNITS CAPS, Take 1,000 Units by mouth daily., Disp: , Rfl:    fluticasone (FLONASE) 50 MCG/ACT nasal spray, USE 2 SPRAYS IN EACH NOSTRIL EVERY DAY, Disp: 48 g, Rfl: 1   gabapentin (NEURONTIN) 300 MG capsule, TAKE ONE CAPSULE BY MOUTH ONCE DAILY AT BEDTIME, Disp: 90 capsule, Rfl: 0   glucose blood (TRUE METRIX BLOOD GLUCOSE TEST) test strip, TEST BLOOD SUGAR EVERY DAY, Disp: 100 strip, Rfl: 3   lisinopril-hydrochlorothiazide (ZESTORETIC) 20-25 MG tablet, Take 1 tablet by mouth daily., Disp: 90 tablet, Rfl: 1   loratadine (CLARITIN) 10 MG tablet, TAKE 1 TABLET EVERY DAY, Disp: 90 tablet, Rfl: 1   metFORMIN (GLUCOPHAGE-XR) 500 MG 24 hr tablet, Take 1 tablet (500 mg total) by mouth  daily with breakfast., Disp: 90 tablet, Rfl: 1   naproxen (NAPROSYN) 500 MG tablet, TAKE 1 TABLET TWICE DAILY WITH MEALS, Disp: 180 tablet, Rfl: 0   omeprazole (PRILOSEC) 40 MG capsule, TAKE 1 CAPSULE EVERY MORNING, Disp: 90 capsule, Rfl: 1   potassium gluconate 595 (99 K) MG TABS tablet, Take 99 mg by mouth daily., Disp: , Rfl:    sildenafil (REVATIO) 20 MG tablet, TAKE 1-2 TABLETS DAILY AS NEEDED, Disp: 30 tablet, Rfl: 1   simvastatin (ZOCOR) 40 MG tablet, TAKE 1 TABLET (40 MG TOTAL) BY MOUTH AT BEDTIME., Disp: 90 tablet, Rfl: 0   sodium chloride (OCEAN) 0.65 % SOLN nasal spray, Place 1 spray into both nostrils as needed for congestion., Disp: 60 mL, Rfl: 0   TRUEplus Lancets 33G MISC, TEST BLOOD SUGAR EVERY DAY, Disp: 100 each, Rfl: 3   Allergies  Allergen Reactions   Hydrocodeine [Dihydrocodeine] Nausea And Vomiting    Past Medical History:  Diagnosis Date   Allergic rhinitis    by referral on 11/02/19/   Arthritis    Cataract    Chronic left shoulder pain    From [facility] received 11/02/19/   DDD (degenerative disc disease)    Dyspnea    on exertion    GERD (gastroesophageal reflux disease)    Hyperlipidemia    Hypertension    Intestinal flu 2000   OSA on CPAP    MODERATE OSA PER STUDY 2010  Perianal fistula    PONV (postoperative nausea and vomiting)    and hear to wake   Severe obesity (BMI >= 40) (HCC)    Received from [facility] on 11/02/19/   Type 2 diabetes mellitus with diabetic polyneuropathy, without long-term current use of insulin ([place])    pt. states he's borderline     Past Surgical History:  Procedure Laterality Date   ANTERIOR CERVICAL DECOMP/DISCECTOMY FUSION N/A 02/15/2020   Procedure: ANTERIOR CERVICAL DECOMPRESSON FUSION CERVICAL THREE-FOUR, CERVICAL FOUR-FIVE,CERVICAL FIVE-SIX.;  Surgeon: Consuella Lose, MD;  Location: [place];  Service: Neurosurgery;  Laterality: N/A;  anterior   CERVICAL FUSION  1990's   EVALUATION UNDER ANESTHESIA WITH ANAL  FISTULECTOMY N/A 08/03/2012   Procedure: EXAM UNDER ANESTHESIA WITH ANAL FISTULECTOMY ;  Surgeon: Leighton Ruff, MD;  Location: [place];  Service: General;  Laterality: N/A;   INCISION AND DRAINAGE PERIRECTAL ABSCESS  02/13/2012   Procedure: IRRIGATION AND DEBRIDEMENT PERIRECTAL ABSCESS;  Surgeon: Earnstine Regal, MD;  Location: [facility];  Service: General;  Laterality: N/A;   LUMBAR FUSION  2010   has had 2 low back surgeries    RIGHT HYDROCELECTOMY  07-28-2001   SHOULDER ARTHROSCOPY WITH ROTATOR CUFF REPAIR Left 28's    Family History  Problem Relation Age of Onset   Brain cancer Mother        tumor   Diabetes Mother    Kidney Stones Father    Diabetes Sister    Heart disease Sister    GI problems Sister    Cancer Brother        prostate   Cancer Brother        lymphoma    Heart disease Brother    Blindness Son    Arthritis Son        trouble with legs   Heart disease Paternal Uncle    Arthritis Son        shoulder and back     Social History   Tobacco Use   Smoking status: Former    Packs/day: 0.50    Years: 25.00    Pack years: 12.50    Types: Cigarettes    Quit date: 02/12/2004    Years since quitting: 16.7   Smokeless tobacco: Never  Vaping Use   Vaping Use: Never used  Substance Use Topics   Alcohol use: Yes    Comment: occassional   Drug use: No    ROS   Objective:   Vitals: BP 134/83 (BP Location: Right Arm)   Pulse 81   Temp 99 F (37.2 C) (Oral)   Resp 16   SpO2 98%   Physical Exam Constitutional:      General: He is not in acute distress.    Appearance: Normal appearance. He is well-developed and normal weight. He is not ill-appearing, toxic-appearing or diaphoretic.  HENT:     Head: Normocephalic and atraumatic.     Right Ear: Tympanic membrane, ear canal and external ear normal. There is no impacted cerumen.     Left Ear: Tympanic membrane, ear canal and external ear normal. There is no impacted cerumen.     Nose: Nose  normal. No congestion or rhinorrhea.     Mouth/Throat:     Mouth: Mucous membranes are moist.     Pharynx: No oropharyngeal exudate or posterior oropharyngeal erythema.  Eyes:     General: No scleral icterus.       Right eye: No discharge.  Left eye: No discharge.     Extraocular Movements: Extraocular movements intact.     Conjunctiva/sclera: Conjunctivae normal.     Pupils: Pupils are equal, round, and reactive to light.  Cardiovascular:     Rate and Rhythm: Normal rate and regular rhythm.     Heart sounds: Normal heart sounds. No murmur heard.   No friction rub. No gallop.  Pulmonary:     Effort: Pulmonary effort is normal. No respiratory distress.     Breath sounds: Normal breath sounds. No stridor. No wheezing, rhonchi or rales.  Musculoskeletal:     Cervical back: Normal range of motion and neck supple. No rigidity. No muscular tenderness.  Neurological:     General: No focal deficit present.     Mental Status: He is alert and oriented to person, place, and time.  Psychiatric:        Mood and Affect: Mood normal.        Behavior: Behavior normal.        Thought Content: Thought content normal.      Assessment and Plan :   PDMP not reviewed this encounter.  1. Encounter for screening for COVID-19     Creatinine clearance calculated at 98m/min using creatinine level from 06/13/2020. Will manage for viral illness such as viral URI, viral syndrome, viral rhinitis, COVID-19. Counseled patient on nature of COVID-19 including modes of transmission, diagnostic testing, management and supportive care.  Offered scripts for symptomatic relief. COVID 19 testing is pending. Counseled patient on potential for adverse effects with medications prescribed/recommended today, ER and return-to-clinic precautions discussed, patient verbalized understanding.     MJaynee Eagles PA-C 11/20/20 1339

## 2020-11-21 ENCOUNTER — Telehealth (HOSPITAL_COMMUNITY): Payer: Self-pay | Admitting: Emergency Medicine

## 2020-11-21 LAB — COVID-19, FLU A+B NAA
Influenza A, NAA: NOT DETECTED
Influenza B, NAA: NOT DETECTED
SARS-CoV-2, NAA: DETECTED — AB

## 2020-11-21 MED ORDER — MOLNUPIRAVIR EUA 200MG CAPSULE: 4.0000 | ORAL_CAPSULE | Freq: Two times a day (BID) | ORAL | 0 refills | Status: AC

## 2020-12-09 ENCOUNTER — Other Ambulatory Visit: Payer: Self-pay | Admitting: Nurse Practitioner

## 2020-12-09 DIAGNOSIS — N5201 Erectile dysfunction due to arterial insufficiency: Secondary | ICD-10-CM

## 2020-12-14 ENCOUNTER — Ambulatory Visit: Payer: Self-pay | Admitting: Nurse Practitioner

## 2020-12-25 ENCOUNTER — Other Ambulatory Visit: Payer: Self-pay | Admitting: Nurse Practitioner

## 2020-12-25 DIAGNOSIS — I10 Essential (primary) hypertension: Secondary | ICD-10-CM

## 2020-12-25 DIAGNOSIS — K2100 Gastro-esophageal reflux disease with esophagitis, without bleeding: Secondary | ICD-10-CM

## 2020-12-25 DIAGNOSIS — E1142 Type 2 diabetes mellitus with diabetic polyneuropathy: Secondary | ICD-10-CM

## 2020-12-26 ENCOUNTER — Ambulatory Visit (INDEPENDENT_AMBULATORY_CARE_PROVIDER_SITE_OTHER): Payer: Medicare HMO | Admitting: Nurse Practitioner

## 2020-12-26 ENCOUNTER — Encounter: Payer: Self-pay | Admitting: Nurse Practitioner

## 2020-12-26 ENCOUNTER — Other Ambulatory Visit: Payer: Self-pay

## 2020-12-26 VITALS — BP 120/86 | HR 80 | Temp 97.3°F | Ht 68.0 in | Wt 286.0 lb

## 2020-12-26 DIAGNOSIS — K2100 Gastro-esophageal reflux disease with esophagitis, without bleeding: Secondary | ICD-10-CM | POA: Diagnosis not present

## 2020-12-26 DIAGNOSIS — Z23 Encounter for immunization: Secondary | ICD-10-CM

## 2020-12-26 DIAGNOSIS — I10 Essential (primary) hypertension: Secondary | ICD-10-CM | POA: Diagnosis not present

## 2020-12-26 DIAGNOSIS — E1142 Type 2 diabetes mellitus with diabetic polyneuropathy: Secondary | ICD-10-CM | POA: Diagnosis not present

## 2020-12-26 DIAGNOSIS — G629 Polyneuropathy, unspecified: Secondary | ICD-10-CM | POA: Diagnosis not present

## 2020-12-26 DIAGNOSIS — E782 Mixed hyperlipidemia: Secondary | ICD-10-CM

## 2020-12-26 MED ORDER — METFORMIN HCL ER 500 MG PO TB24: 500.0000 mg | ORAL_TABLET | Freq: Every day | ORAL | 1 refills | Status: DC

## 2020-12-26 MED ORDER — BLOOD GLUCOSE METER KIT: PACK | 0 refills | Status: DC

## 2020-12-26 MED ORDER — AMLODIPINE BESYLATE 5 MG PO TABS: 5.0000 mg | ORAL_TABLET | Freq: Every day | ORAL | 1 refills | Status: DC

## 2020-12-26 MED ORDER — GABAPENTIN 300 MG PO CAPS: 300.0000 mg | ORAL_CAPSULE | Freq: Three times a day (TID) | ORAL | 1 refills | Status: DC

## 2020-12-26 MED ORDER — LISINOPRIL-HYDROCHLOROTHIAZIDE 20-25 MG PO TABS: 1.0000 | ORAL_TABLET | Freq: Every day | ORAL | 1 refills | Status: DC

## 2020-12-26 MED ORDER — OMEPRAZOLE 40 MG PO CPDR: DELAYED_RELEASE_CAPSULE | ORAL | 1 refills | Status: DC

## 2020-12-26 NOTE — Patient Instructions (Signed)

## 2020-12-26 NOTE — Addendum Note (Signed)
Addended by: Cleda Daub on: 12/26/2020 04:48 PM   Modules accepted: Orders
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
## 2020-12-27 LAB — MICROALBUMIN / CREATININE URINE RATIO
Creatinine, Urine: 112.3 mg/dL
Microalb/Creat Ratio: <3 mg/g creat (ref 0–29)
Microalbumin, Urine: <3 ug/mL

## 2021-01-03 ENCOUNTER — Telehealth: Payer: Self-pay | Admitting: Nurse Practitioner

## 2021-01-03 DIAGNOSIS — I10 Essential (primary) hypertension: Secondary | ICD-10-CM

## 2021-01-03 DIAGNOSIS — E782 Mixed hyperlipidemia: Secondary | ICD-10-CM

## 2021-01-03 DIAGNOSIS — G629 Polyneuropathy, unspecified: Secondary | ICD-10-CM

## 2021-01-03 DIAGNOSIS — E1142 Type 2 diabetes mellitus with diabetic polyneuropathy: Secondary | ICD-10-CM

## 2021-01-03 DIAGNOSIS — K2100 Gastro-esophageal reflux disease with esophagitis, without bleeding: Secondary | ICD-10-CM

## 2021-01-03 MED ORDER — METFORMIN HCL ER 500 MG PO TB24: 500.0000 mg | ORAL_TABLET | Freq: Every day | ORAL | 1 refills | Status: DC

## 2021-01-03 MED ORDER — AMLODIPINE BESYLATE 5 MG PO TABS: 5.0000 mg | ORAL_TABLET | Freq: Every day | ORAL | 1 refills | Status: DC

## 2021-01-03 MED ORDER — GABAPENTIN 300 MG PO CAPS: 300.0000 mg | ORAL_CAPSULE | Freq: Three times a day (TID) | ORAL | 1 refills | Status: DC

## 2021-01-03 MED ORDER — LISINOPRIL-HYDROCHLOROTHIAZIDE 20-25 MG PO TABS
1.0000 | ORAL_TABLET | Freq: Every day | ORAL | 1 refills | Status: DC
Start: 2021-01-03 — End: 2021-03-29

## 2021-01-03 MED ORDER — OMEPRAZOLE 40 MG PO CPDR: DELAYED_RELEASE_CAPSULE | ORAL | 1 refills | Status: DC

## 2021-01-03 MED ORDER — SIMVASTATIN 40 MG PO TABS: 40.0000 mg | ORAL_TABLET | Freq: Every day | ORAL | 0 refills | Status: DC

## 2021-01-03 NOTE — Telephone Encounter (Signed)
Refills sent to mail order 

## 2021-01-17 ENCOUNTER — Other Ambulatory Visit: Payer: Self-pay | Admitting: Nurse Practitioner

## 2021-01-17 DIAGNOSIS — I10 Essential (primary) hypertension: Secondary | ICD-10-CM

## 2021-01-17 DIAGNOSIS — E1142 Type 2 diabetes mellitus with diabetic polyneuropathy: Secondary | ICD-10-CM

## 2021-01-17 DIAGNOSIS — K2100 Gastro-esophageal reflux disease with esophagitis, without bleeding: Secondary | ICD-10-CM

## 2021-01-26 ENCOUNTER — Other Ambulatory Visit: Payer: Self-pay | Admitting: Nurse Practitioner

## 2021-01-26 DIAGNOSIS — N5201 Erectile dysfunction due to arterial insufficiency: Secondary | ICD-10-CM

## 2021-02-15 DIAGNOSIS — G4733 Obstructive sleep apnea (adult) (pediatric): Secondary | ICD-10-CM | POA: Diagnosis not present

## 2021-03-01 ENCOUNTER — Ambulatory Visit (INDEPENDENT_AMBULATORY_CARE_PROVIDER_SITE_OTHER): Payer: Medicare Other

## 2021-03-01 VITALS — Ht 68.0 in | Wt 290.0 lb

## 2021-03-01 DIAGNOSIS — Z0000 Encounter for general adult medical examination without abnormal findings: Secondary | ICD-10-CM

## 2021-03-01 NOTE — Progress Notes (Signed)
Subjective:   Evan Smith is a 66 y.o. male who presents for Medicare Annual/Subsequent preventive examination.  Virtual Visit via Telephone Note  I connected with  Evan Smith on 03/01/21 at  8:15 AM EST by telephone and verified that I am speaking with the correct person using two identifiers.  Location: Patient: Home Provider: [facility] Persons participating in the virtual visit: patient/Nurse Health Advisor   I discussed the limitations, risks, security and privacy concerns of performing an evaluation and management service by telephone and the availability of in person appointments. The patient expressed understanding and agreed to proceed.  Interactive audio and video telecommunications were attempted between this nurse and patient, however failed, due to patient having technical difficulties OR patient did not have access to video capability.  We continued and completed visit with audio only.  Some vital signs may be absent or patient reported.   Evan Smith E Fue Cervenka, LPN   Review of Systems     Cardiac Risk Factors include: advanced age (>83mn, >>63women);male gender;obesity (BMI >30kg/m2);sedentary lifestyle;dyslipidemia;hypertension;diabetes mellitus     Objective:    Today's Vitals   03/01/21 0821 03/01/21 0822  Weight: 290 lb (131.5 kg)   Height: _0  (1.727 m)   PainSc:  5    Body mass index is 44.09 kg/m.  Advanced Directives 03/01/2021 02/28/2020 02/15/2020 02/09/2020 08/18/2018 03/27/2017 10/14/2016  Does Patient Have a Medical Advance Directive? _1  No No  Would patient like information on creating a medical advance directive? No - Patient declined No - Patient declined No - Patient declined Yes (MAU/Ambulatory/Procedural Areas - Information given) Yes (MAU/Ambulatory/Procedural Areas - Information given) Yes (MAU/Ambulatory/Procedural Areas - Information given) -  Pre-existing out of facility DNR order (yellow form or pink MOST form) - - - - - - -     Current Medications (verified) Outpatient Encounter Medications as of 03/01/2021  Medication Sig   amLODipine (NORVASC) 5 MG tablet Take 1 tablet (5 mg total) by mouth daily.   aspirin 81 MG tablet Take 1 tablet (81 mg total) by mouth daily.   blood glucose meter kit and supplies Dispense based on patient and insurance preference. Use up to four times daily as directed. (FOR ICD-10 E10.9, E11.9).   Blood Glucose Monitoring Suppl (TRUE METRIX AIR GLUCOSE METER) w/Device KIT 1 Device by Does not apply route daily. Dx E11.9   celecoxib (CELEBREX) 200 MG capsule Take 200 mg by mouth daily.   cetirizine (ZYRTEC ALLERGY) 10 MG tablet Take 1 tablet (10 mg total) by mouth daily.   Cholecalciferol (VITAMIN D3) 1000 UNITS CAPS Take 1,000 Units by mouth daily.   fluticasone (FLONASE) 50 MCG/ACT nasal spray USE 2 SPRAYS IN EACH NOSTRIL EVERY DAY   gabapentin (NEURONTIN) 300 MG capsule Take 1 capsule (300 mg total) by mouth 3 (three) times daily.   lisinopril-hydrochlorothiazide (ZESTORETIC) 20-25 MG tablet Take 1 tablet by mouth daily.   loratadine (CLARITIN) 10 MG tablet TAKE 1 TABLET EVERY DAY   metFORMIN (GLUCOPHAGE-XR) 500 MG 24 hr tablet Take 1 tablet (500 mg total) by mouth daily with breakfast.   naproxen (NAPROSYN) 500 MG tablet TAKE 1 TABLET TWICE DAILY WITH MEALS   omeprazole (PRILOSEC) 40 MG capsule TAKE 1 CAPSULE EVERY MORNING   potassium gluconate 595 (99 K) MG TABS tablet Take 99 mg by mouth daily.   sildenafil (REVATIO) 20 MG tablet Take 1-2 tablets (20-40 mg total) by mouth daily as needed.   simvastatin (ZOCOR) 40 MG  tablet Take 1 tablet (40 mg total) by mouth at bedtime.   sodium chloride (OCEAN) 0.65 % SOLN nasal spray Place 1 spray into both nostrils as needed for congestion.   Alcohol Swabs (B-D SINGLE USE SWABS REGULAR) PADS USE AS DIRECTED (Patient not taking: Reported on 03/01/2021)   benzonatate (TESSALON) 100 MG capsule Take 1-2 capsules (100-200 mg total) by mouth 3 (three)  times daily as needed for cough. (Patient not taking: Reported on 03/01/2021)   Blood Glucose Calibration (TRUE METRIX LEVEL 1) Low SOLN USE AS DIRECTED (Patient not taking: Reported on 03/01/2021)   glucose blood (TRUE METRIX BLOOD GLUCOSE TEST) test strip TEST BLOOD SUGAR EVERY DAY (Patient not taking: Reported on 03/01/2021)   promethazine-dextromethorphan (PROMETHAZINE-DM) 6.25-15 MG/5ML syrup Take 5 mLs by mouth at bedtime as needed for cough. (Patient not taking: Reported on 03/01/2021)   pseudoephedrine (SUDAFED) 30 MG tablet Take 1 tablet (30 mg total) by mouth every 8 (eight) hours as needed for congestion. (Patient not taking: Reported on 03/01/2021)   TRUEplus Lancets 33G MISC TEST BLOOD SUGAR EVERY DAY (Patient not taking: Reported on 03/01/2021)   No facility-administered encounter medications on file as of 03/01/2021.    Allergies (verified) Hydrocodone and Hydrocodeine [dihydrocodeine]   History: Past Medical History:  Diagnosis Date   Allergic rhinitis    by referral on 11/02/19/   Arthritis    Cataract    Chronic left shoulder pain    From [facility] received 11/02/19/   DDD (degenerative disc disease)    Dyspnea    on exertion    GERD (gastroesophageal reflux disease)    Hyperlipidemia    Hypertension    Intestinal flu 2000   OSA on CPAP    MODERATE OSA PER STUDY 2010   Perianal fistula    PONV (postoperative nausea and vomiting)    and hear to wake   Severe obesity (BMI >= 40) ([place])    Received from [facility] on 11/02/19/   Type 2 diabetes mellitus with diabetic polyneuropathy, without long-term current use of insulin ([place])    pt. states he's borderline   Past Surgical History:  Procedure Laterality Date   ANTERIOR CERVICAL DECOMP/DISCECTOMY FUSION N/A 02/15/2020   Procedure: ANTERIOR CERVICAL DECOMPRESSON FUSION CERVICAL THREE-FOUR, CERVICAL FOUR-FIVE,CERVICAL FIVE-SIX.;  Surgeon: Consuella Lose, MD;  Location: [place];  Service: Neurosurgery;  Laterality: N/A;   anterior   CERVICAL FUSION  1990's   EVALUATION UNDER ANESTHESIA WITH ANAL FISTULECTOMY N/A 08/03/2012   Procedure: EXAM UNDER ANESTHESIA WITH ANAL FISTULECTOMY ;  Surgeon: Leighton Ruff, MD;  Location: [place];  Service: General;  Laterality: N/A;   INCISION AND DRAINAGE PERIRECTAL ABSCESS  02/13/2012   Procedure: IRRIGATION AND DEBRIDEMENT PERIRECTAL ABSCESS;  Surgeon: Earnstine Regal, MD;  Location: [place];  Service: General;  Laterality: N/A;   LUMBAR FUSION  2010   has had 2 low back surgeries    RIGHT HYDROCELECTOMY  07-28-2001   SHOULDER ARTHROSCOPY WITH ROTATOR CUFF REPAIR Left 92's   Family History  Problem Relation Age of Onset   Brain cancer Mother        tumor   Diabetes Mother    Kidney Stones Father    Diabetes Sister    Heart disease Sister    GI problems Sister    Cancer Brother        prostate   Cancer Brother        lymphoma    Heart disease Brother    Blindness Son  Arthritis Son        trouble with legs   Heart disease Paternal Uncle    Arthritis Son        shoulder and back    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, music - disability   Tobacco Use   Smoking status: Former    Packs/day: 0.50    Years: 25.00    Pack years: 12.50    Types: Cigarettes    Quit date: 02/12/2004    Years since quitting: 17.0   Smokeless tobacco: Never  Vaping Use   Vaping Use: Never used  Substance and Sexual Activity   Alcohol use: Yes    Comment: occassional   Drug use: No   Sexual activity: Yes  Other Topics Concern   Not on file  Social History Narrative   Lives by self   No caffeine, very seldom   Right handed   Lives alone on ground level senior apartment   Social Determinants of Health   Financial Resource Strain:  Low Risk    Difficulty of Paying Living Expenses: Not very hard  Food Insecurity: Food Insecurity Present   Worried About Running Out of Food in the Last Year: Sometimes true   Ran Out of Food in the Last Year: Never true  Transportation Needs: No Transportation Needs   Lack of Transportation (Medical): No   Lack of Transportation (Non-Medical): No  Physical Activity: Insufficiently Active   Days of Exercise per Week: 7 days   Minutes of Exercise per Session: 20 min  Stress: No Stress Concern Present   Feeling of Stress : Not at all  Social Connections: Moderately Integrated   Frequency of Communication with Friends and Family: More than three times a week   Frequency of Social Gatherings with Friends and Family: Twice a week   Attends Religious Services: More than 4 times per year   Active Member of [employer] or Organizations: Yes   Attends Archivist Meetings: 1 to 4 times per year   Marital Status: Separated    Tobacco Counseling Counseling given: Not Answered   Clinical Intake:  Pre-visit preparation completed: Yes  Pain : 0-10 Pain Score: 5  Pain Type: Chronic pain Pain Location: Back Pain Descriptors / Indicators: Aching, Sore Pain Onset: More than a month ago Pain Frequency: Intermittent     BMI - recorded: 44.09 Nutritional Status: BMI > 30  Obese Nutritional Risks: None Diabetes: No  How often do you need to have someone help you when you read instructions, pamphlets, or other written materials from your doctor or pharmacy?: 1 - Never  Diabetic? no  Interpreter Needed?: No  Information entered by :: Nikky Duba, LPN   Activities of Daily Living In your present state of health, do you have any difficulty performing the following activities: 03/01/2021  Hearing? N  Vision? N  Difficulty concentrating or making decisions? N  Walking or climbing stairs? Y  Comment hurts back and legs  Dressing or bathing? N  Doing errands, shopping? N   Preparing Food and eating ? N  Using the Toilet? N  In the past six months, have you accidently leaked urine? N  Do you have problems with loss of bowel  control? N  Managing your Medications? N  Managing your Finances? N  Housekeeping or managing your Housekeeping? N  Some recent data might be hidden    Patient Care Team: Chevis Pretty, FNP as PCP - General (Family Medicine) Armandina Gemma, MD as Consulting Physician (General Surgery) Danie Binder, MD (Inactive) as Consulting Physician (Gastroenterology) Andris Flurry, MD (Orthopedic Surgery) Jovita Gamma, MD as Consulting Physician (Neurosurgery) Leeroy Cha, MD as Attending Physician (Neurosurgery)  Indicate any recent Medical Services you may have received from other than [facility] providers in the past year (date may be approximate).     Assessment:   This is a routine wellness examination for Loranzo.  Hearing/Vision screen Hearing Screening - Comments:: Denies hearing difficulties  Vision Screening - Comments:: Wears rx glasses - up to date with annual eye exams with [facility] in [place]  Dietary issues and exercise activities discussed: Current Exercise Habits: Home exercise routine, Type of exercise: walking, Time (Minutes): 20, Frequency (Times/Week): 7, Weekly Exercise (Minutes/Week): 140, Intensity: Mild, Exercise limited by: orthopedic condition(s);neurologic condition(s)   Goals Addressed             This Visit's Progress    Increase physical activity   On track    Start walking  Get active      Patient Stated   Not on track    02/28/2020 AWV Goal: Fall Prevention  Over the next year, patient will decrease their risk for falls by: Using assistive devices, such as a cane or walker, as needed Identifying fall risks within their home and correcting them by: Removing throw rugs Adding handrails to stairs or ramps Removing clutter and keeping a clear pathway throughout the  home Increasing light, especially at night Adding shower handles/bars Raising toilet seat Identifying potential personal risk factors for falls: Medication side effects Incontinence/urgency Vestibular dysfunction Hearing loss Musculoskeletal disorders Neurological disorders Orthostatic hypotension         Depression Screen PHQ 2/9 Scores 03/01/2021 06/13/2020 03/06/2020 02/28/2020 11/15/2019 08/19/2019 05/17/2019  PHQ - 2 Score 0 0 0 0 0 0 0    Fall Risk Fall Risk  03/01/2021 06/13/2020 03/06/2020 02/28/2020 11/15/2019  Falls in the past year? 1 0 0 0 0  Number falls in past yr: 1 - - - -  Injury with Fall? 0 - - - -  Risk for fall due to : History of fall(s);Impaired balance/gait;Orthopedic patient - - - -  Risk for fall due to: Comment - - - - -  Follow up Education provided;Falls prevention discussed - - Falls evaluation completed -    FALL RISK PREVENTION PERTAINING TO THE HOME:  Any stairs in or around the home? No  If so, are there any without handrails? No  Home free of loose throw rugs in walkways, pet beds, electrical cords, etc? Yes  Adequate lighting in your home to reduce risk of falls? Yes   ASSISTIVE DEVICES UTILIZED TO PREVENT FALLS:  Life alert? No  Use of a cane, walker or w/c? Yes  Grab bars in the bathroom? Yes  Shower chair or bench in shower? Yes  Elevated toilet seat or a handicapped toilet? Yes   TIMED UP AND GO:  Was the test performed? No . Telephonic visit  Cognitive Function: MMSE - Mini Mental State Exam 03/27/2017 03/22/2016 03/21/2016 03/09/2015  Orientation to time 5 - 5 5  Orientation to Place 5 - 5 5  Registration 3 - 3 3  Attention/ Calculation 5 - 3 4  Recall 3 - 3 3  Language- name 2 objects 2 - 2 2  Language- repeat 1 - 1 1  Language- follow 3 step command 3 - 3 3  Language- read & follow direction 1 - 1 1  Write a sentence 1 - 1 1  Copy design _0 Total score 30 - 28 29     6CIT Screen 03/01/2021 02/28/2020 08/18/2018  What Year? 0  points - 0 points  What month? 0 points - 0 points  What time? 0 points 0 points 0 points  Count back from 20 0 points 0 points 0 points  Months in reverse 0 points 0 points 0 points  Repeat phrase 0 points 2 points 0 points  Total Score 0 - 0    Immunizations Immunization History  Administered Date(s) Administered   Fluad Quad(high Dose 65+) 11/15/2019, 12/26/2020   Influenza,inj,Quad PF,6+ Mos 11/24/2013, 01/13/2015, 11/24/2015, 11/26/2016, 12/02/2017, 11/12/2018   Moderna Sars-Covid-2 Vaccination 03/28/2019, 04/25/2019, 12/30/2019   Pneumococcal Conjugate-13 12/02/2017   Pneumococcal Polysaccharide-23 08/19/2019    TDAP status: Due, Education has been provided regarding the importance of this vaccine. Advised may receive this vaccine at local pharmacy or Health Dept. Aware to provide a copy of the vaccination record if obtained from local pharmacy or Health Dept. Verbalized acceptance and understanding.  Flu Vaccine status: Up to date  Pneumococcal vaccine status: Up to date  Covid-19 vaccine status: Completed vaccines  Qualifies for Shingles Vaccine? Yes   Zostavax completed No   Shingrix Completed?: No.    Education has been provided regarding the importance of this vaccine. Patient has been advised to call insurance company to determine out of pocket expense if they have not yet received this vaccine. Advised may also receive vaccine at local pharmacy or Health Dept. Verbalized acceptance and understanding.  Screening Tests Health Maintenance  Topic Date Due   Zoster Vaccines- Shingrix (1 of 2) Never done   TETANUS/TDAP  05/26/2016   COVID-19 Vaccine (4 - Booster for Moderna series) 02/24/2020   HEMOGLOBIN A1C  12/14/2020   OPHTHALMOLOGY EXAM  01/11/2021   FOOT EXAM  06/13/2021   Fecal DNA (Cologuard)  07/06/2022   Pneumonia Vaccine 80+ Years old  Completed   INFLUENZA VACCINE  Completed   Hepatitis C Screening  Completed   HPV VACCINES  Aged Out   COLONOSCOPY (Pts  45-59yr Insurance coverage will need to be confirmed)  Discontinued    Health Maintenance  Health Maintenance Due  Topic Date Due   Zoster Vaccines- Shingrix (1 of 2) Never done   TETANUS/TDAP  05/26/2016   COVID-19 Vaccine (4 - Booster for Moderna series) 02/24/2020   HEMOGLOBIN A1C  12/14/2020   OPHTHALMOLOGY EXAM  01/11/2021    Colorectal cancer screening: Type of screening: Cologuard. Completed 07/06/2019. Repeat every 3 years  Lung Cancer Screening: (Low Dose CT Chest recommended if Age 67-80years, 30 pack-year currently smoking OR have quit w/in 15years.) does not qualify.   Additional Screening:  Hepatitis C Screening: does qualify; Completed 03/09/2015  Vision Screening: Recommended annual ophthalmology exams for early detection of glaucoma and other disorders of the eye. Is the patient up to date with their annual eye exam?  Yes  Who is the provider or what is the name of the office in which the patient attends annual eye exams[place] [facility] [place] not established with a provider, would they like to be referred to a provider to establish care? No .  Dental Screening: Recommended annual dental exams for proper oral hygiene  Community Resource Referral / Chronic Care Management: CRR required this visit?  No   CCM required this visit?  No      Plan:     I have personally reviewed and noted the following in the patients chart:   Medical and social history Use of alcohol, tobacco or illicit drugs  Current medications and supplements including opioid prescriptions. Patient is not currently taking opioid prescriptions. Functional ability and status Nutritional status Physical activity Advanced directives List of other physicians Hospitalizations, surgeries, and ER visits in previous 12 months Vitals Screenings to include cognitive, depression, and falls Referrals and appointments  In addition, I have reviewed and discussed with patient  certain preventive protocols, quality metrics, and best practice recommendations. A written personalized care plan for preventive services as well as general preventive health recommendations were provided to patient.     Sandrea Hammond, LPN   08/22/5269   Nurse Notes: None

## 2021-03-01 NOTE — Patient Instructions (Signed)
[name] , Thank you for taking time to come for your Medicare Wellness Visit. I appreciate your ongoing commitment to your health goals. Please review the following plan we discussed and let me know if I can assist you in the future.   Screening recommendations/referrals: Colonoscopy: Cologuard done 07/06/2019 - repeat in 3 years Recommended yearly ophthalmology/optometry visit for glaucoma screening and checkup Recommended yearly dental visit for hygiene and checkup  Vaccinations: Influenza vaccine: Done 12/26/2200 - Repeat annually  Pneumococcal vaccine: Done 12/02/17 & 08/19/19 Tdap vaccine: Due Shingles vaccine: Due   Covid-19: Done 03/28/2019, 04/25/2019, 12/30/2019  Advanced directives: Advance directive discussed with you today. Even though you declined this today, please call our office should you change your mind, and we can give you the proper paperwork for you to fill out.   Conditions/risks identified: Aim for 30 minutes of exercise or brisk walking each day, drink 6-8 glasses of water and eat lots of fruits and vegetables.   Next appointment: Follow up in one year for your annual wellness visit.   Preventive Care 18 Years and Older, Male  Preventive care refers to lifestyle choices and visits with your health care provider that can promote health and wellness. What does preventive care include? A yearly physical exam. This is also called an annual well check. Dental exams once or twice a year. Routine eye exams. Ask your health care provider how often you should have your eyes checked. Personal lifestyle choices, including: Daily care of your teeth and gums. Regular physical activity. Eating a healthy diet. Avoiding tobacco and drug use. Limiting alcohol use. Practicing safe sex. Taking low doses of aspirin every day. Taking vitamin and mineral supplements as recommended by your health care provider. What happens during an annual well check? The services and  screenings done by your health care provider during your annual well check will depend on your age, overall health, lifestyle risk factors, and family history of disease. Counseling  Your health care provider may ask you questions about your: Alcohol use. Tobacco use. Drug use. Emotional well-being. Home and relationship well-being. Sexual activity. Eating habits. History of falls. Memory and ability to understand (cognition). Work and work Astronomer. Screening  You may have the following tests or measurements: Height, weight, and BMI. Blood pressure. Lipid and cholesterol levels. These may be checked every 5 years, or more frequently if you are over 5 years old. Skin check. Lung cancer screening. You may have this screening every year starting at age 29 if you have a 30-pack-year history of smoking and currently smoke or have quit within the past 15 years. Fecal occult blood test (FOBT) of the stool. You may have this test every year starting at age 19. Flexible sigmoidoscopy or colonoscopy. You may have a sigmoidoscopy every 5 years or a colonoscopy every 10 years starting at age 6. Prostate cancer screening. Recommendations will vary depending on your family history and other risks. Hepatitis C blood test. Hepatitis B blood test. Sexually transmitted disease (STD) testing. Diabetes screening. This is done by checking your blood sugar (glucose) after you have not eaten for a while (fasting). You may have this done every 1-3 years. Abdominal aortic aneurysm (AAA) screening. You may need this if you are a current or former smoker. Osteoporosis. You may be screened starting at age 80 if you are at high risk. Talk with your health care provider about your test results, treatment options, and if necessary, the need for more tests. Vaccines  Your health  care provider may recommend certain vaccines, such as: Influenza vaccine. This is recommended every year. Tetanus, diphtheria, and  acellular pertussis (Tdap, Td) vaccine. You may need a Td booster every 10 years. Zoster vaccine. You may need this after age 81. Pneumococcal 13-valent conjugate (PCV13) vaccine. One dose is recommended after age 53. Pneumococcal polysaccharide (PPSV23) vaccine. One dose is recommended after age 65. Talk to your health care provider about which screenings and vaccines you need and how often you need them. This information is not intended to replace advice given to you by your health care provider. Make sure you discuss any questions you have with your health care provider. Document Released: 02/24/2015 Document Revised: 10/18/2015 Document Reviewed: 11/29/2014 Elsevier Interactive Patient Education  2017 [place] Prevention in the Home Falls can cause injuries. They can happen to people of all ages. There are many things you can do to make your home safe and to help prevent falls. What can I do on the outside of my home? Regularly fix the edges of walkways and driveways and fix any cracks. Remove anything that might make you trip as you walk through a door, such as a raised step or threshold. Trim any bushes or trees on the path to your home. Use bright outdoor lighting. Clear any walking paths of anything that might make someone trip, such as rocks or tools. Regularly check to see if handrails are loose or broken. Make sure that both sides of any steps have handrails. Any raised decks and porches should have guardrails on the edges. Have any leaves, snow, or ice cleared regularly. Use sand or salt on walking paths during winter. Clean up any spills in your garage right away. This includes oil or grease spills. What can I do in the bathroom? Use night lights. Install grab bars by the toilet and in the tub and shower. Do not use towel bars as grab bars. Use non-skid mats or decals in the tub or shower. If you need to sit down in the shower, use a plastic, non-slip stool. Keep  the floor dry. Clean up any water that spills on the floor as soon as it happens. Remove soap buildup in the tub or shower regularly. Attach bath mats securely with double-sided non-slip rug tape. Do not have throw rugs and other things on the floor that can make you trip. What can I do in the bedroom? Use night lights. Make sure that you have a light by your bed that is easy to reach. Do not use any sheets or blankets that are too big for your bed. They should not hang down onto the floor. Have a firm chair that has side arms. You can use this for support while you get dressed. Do not have throw rugs and other things on the floor that can make you trip. What can I do in the kitchen? Clean up any spills right away. Avoid walking on wet floors. Keep items that you use a lot in easy-to-reach places. If you need to reach something above you, use a strong step stool that has a grab bar. Keep electrical cords out of the way. Do not use floor polish or wax that makes floors slippery. If you must use wax, use non-skid floor wax. Do not have throw rugs and other things on the floor that can make you trip. What can I do with my stairs? Do not leave any items on the stairs. Make sure that there are handrails on  both sides of the stairs and use them. Fix handrails that are broken or loose. Make sure that handrails are as long as the stairways. Check any carpeting to make sure that it is firmly attached to the stairs. Fix any carpet that is loose or worn. Avoid having throw rugs at the top or bottom of the stairs. If you do have throw rugs, attach them to the floor with carpet tape. Make sure that you have a light switch at the top of the stairs and the bottom of the stairs. If you do not have them, ask someone to add them for you. What else can I do to help prevent falls? Wear shoes that: Do not have high heels. Have rubber bottoms. Are comfortable and fit you well. Are closed at the toe. Do not  wear sandals. If you use a stepladder: Make sure that it is fully opened. Do not climb a closed stepladder. Make sure that both sides of the stepladder are locked into place. Ask someone to hold it for you, if possible. Clearly mark and make sure that you can see: Any grab bars or handrails. First and last steps. Where the edge of each step is. Use tools that help you move around (mobility aids) if they are needed. These include: Canes. Walkers. Scooters. Crutches. Turn on the lights when you go into a dark area. Replace any light bulbs as soon as they burn out. Set up your furniture so you have a clear path. Avoid moving your furniture around. If any of your floors are uneven, fix them. If there are any pets around you, be aware of where they are. Review your medicines with your doctor. Some medicines can make you feel dizzy. This can increase your chance of falling. Ask your doctor what other things that you can do to help prevent falls. This information is not intended to replace advice given to you by your health care provider. Make sure you discuss any questions you have with your health care provider. Document Released: 11/24/2008 Document Revised: 07/06/2015 Document Reviewed: 03/04/2014 Elsevier Interactive Patient Education  2017 [employer].

## 2021-03-29 ENCOUNTER — Encounter: Payer: Self-pay | Admitting: Nurse Practitioner

## 2021-03-29 ENCOUNTER — Ambulatory Visit (INDEPENDENT_AMBULATORY_CARE_PROVIDER_SITE_OTHER): Payer: Medicare Other | Admitting: Nurse Practitioner

## 2021-03-29 VITALS — BP 118/76 | HR 66 | Temp 98.5°F | Resp 20 | Ht 68.0 in | Wt 292.0 lb

## 2021-03-29 DIAGNOSIS — I10 Essential (primary) hypertension: Secondary | ICD-10-CM

## 2021-03-29 DIAGNOSIS — E1142 Type 2 diabetes mellitus with diabetic polyneuropathy: Secondary | ICD-10-CM | POA: Diagnosis not present

## 2021-03-29 DIAGNOSIS — M25511 Pain in right shoulder: Secondary | ICD-10-CM

## 2021-03-29 DIAGNOSIS — E782 Mixed hyperlipidemia: Secondary | ICD-10-CM | POA: Diagnosis not present

## 2021-03-29 DIAGNOSIS — Z23 Encounter for immunization: Secondary | ICD-10-CM

## 2021-03-29 DIAGNOSIS — K2100 Gastro-esophageal reflux disease with esophagitis, without bleeding: Secondary | ICD-10-CM | POA: Diagnosis not present

## 2021-03-29 DIAGNOSIS — G4733 Obstructive sleep apnea (adult) (pediatric): Secondary | ICD-10-CM | POA: Diagnosis not present

## 2021-03-29 DIAGNOSIS — N5201 Erectile dysfunction due to arterial insufficiency: Secondary | ICD-10-CM

## 2021-03-29 DIAGNOSIS — G629 Polyneuropathy, unspecified: Secondary | ICD-10-CM | POA: Diagnosis not present

## 2021-03-29 LAB — BAYER DCA HB A1C WAIVED: HB A1C (BAYER DCA - WAIVED): 5.4 % (ref 4.8–5.6)

## 2021-03-29 MED ORDER — SIMVASTATIN 40 MG PO TABS: 40.0000 mg | ORAL_TABLET | Freq: Every day | ORAL | 1 refills | Status: DC

## 2021-03-29 MED ORDER — SILDENAFIL CITRATE 20 MG PO TABS: 20.0000 mg | ORAL_TABLET | Freq: Every day | ORAL | 1 refills | Status: DC | PRN

## 2021-03-29 MED ORDER — OMEPRAZOLE 40 MG PO CPDR: DELAYED_RELEASE_CAPSULE | ORAL | 1 refills | Status: DC

## 2021-03-29 MED ORDER — GABAPENTIN 300 MG PO CAPS: 300.0000 mg | ORAL_CAPSULE | Freq: Three times a day (TID) | ORAL | 1 refills | Status: DC

## 2021-03-29 MED ORDER — MELOXICAM 15 MG PO TABS
15.0000 mg | ORAL_TABLET | Freq: Every day | ORAL | 2 refills | Status: DC
Start: 2021-03-29 — End: 2021-06-01

## 2021-03-29 MED ORDER — LISINOPRIL-HYDROCHLOROTHIAZIDE 20-25 MG PO TABS: 1.0000 | ORAL_TABLET | Freq: Every day | ORAL | 1 refills | Status: DC

## 2021-03-29 MED ORDER — AMLODIPINE BESYLATE 5 MG PO TABS: 5.0000 mg | ORAL_TABLET | Freq: Every day | ORAL | 1 refills | Status: DC

## 2021-03-29 MED ORDER — METFORMIN HCL ER 500 MG PO TB24: 500.0000 mg | ORAL_TABLET | Freq: Every day | ORAL | 1 refills | Status: DC

## 2021-03-29 NOTE — Progress Notes (Signed)
Subjective:    Patient ID: Evan Smith, male    DOB: 08/26/1955, 66 y.o.   MRN: [id number]   Chief Complaint: medical management of chronic issues     HPI:  Evan Smith is a 66 y.o. who identifies as a male who was assigned male at birth.   Social history: Lives with: by hisself Work history: disability   Comes in today for follow up of the following chronic medical issues:  1. Essential hypertension No c/o chest pain, sob or headache. Does not check blood pressure at home. BP Readings from Last 3 Encounters:  12/26/20 120/86  11/20/20 134/83  06/13/20 114/72     2. Type 2 diabetes mellitus with diabetic polyneuropathy, without long-term current use of insulin ([place]) He has not been checking his blood sugars at home. He does not really watch diet very closely. Lab Results  Component Value Date   HGBA1C 5.5 06/13/2020     3. Mixed hyperlipidemia Does not watch diet and does very little exercise. Lab Results  Component Value Date   CHOL 147 06/13/2020   HDL 52 06/13/2020   LDLCALC 75 06/13/2020   TRIG 110 06/13/2020   CHOLHDL 2.8 06/13/2020     4. Obstructive sleep apnea syndrome Patient wears cpap machine nightly  5. Neuropathy Has numbness in his hands. He is on neurontin  6. Severe obesity (BMI >= 40) (HCC) No recent weight changes. Wt Readings from Last 3 Encounters:  03/29/21 292 lb (132.5 kg)  03/01/21 290 lb (131.5 kg)  12/26/20 286 lb (129.7 kg)   BMI Readings from Last 3 Encounters:  03/29/21 44.40 kg/m  03/01/21 44.09 kg/m  12/26/20 43.49 kg/m      New complaints: Still having right shoulder pain. He had neck surgery last year and they thought that was the problem, but did not help.   Allergies  Allergen Reactions   Hydrocodone     Other reaction(s): itch   Hydrocodeine [Dihydrocodeine] Nausea And Vomiting   Outpatient Encounter Medications as of 03/29/2021  Medication Sig   Alcohol Swabs (B-D SINGLE USE SWABS  REGULAR) PADS USE AS DIRECTED (Patient not taking: Reported on 03/01/2021)   amLODipine (NORVASC) 5 MG tablet Take 1 tablet (5 mg total) by mouth daily.   aspirin 81 MG tablet Take 1 tablet (81 mg total) by mouth daily.   benzonatate (TESSALON) 100 MG capsule Take 1-2 capsules (100-200 mg total) by mouth 3 (three) times daily as needed for cough. (Patient not taking: Reported on 03/01/2021)   Blood Glucose Calibration (TRUE METRIX LEVEL 1) Low SOLN USE AS DIRECTED (Patient not taking: Reported on 03/01/2021)   blood glucose meter kit and supplies Dispense based on patient and insurance preference. Use up to four times daily as directed. (FOR ICD-10 E10.9, E11.9).   Blood Glucose Monitoring Suppl (TRUE METRIX AIR GLUCOSE METER) w/Device KIT 1 Device by Does not apply route daily. Dx E11.9   celecoxib (CELEBREX) 200 MG capsule Take 200 mg by mouth daily.   cetirizine (ZYRTEC ALLERGY) 10 MG tablet Take 1 tablet (10 mg total) by mouth daily.   Cholecalciferol (VITAMIN D3) 1000 UNITS CAPS Take 1,000 Units by mouth daily.   fluticasone (FLONASE) 50 MCG/ACT nasal spray USE 2 SPRAYS IN EACH NOSTRIL EVERY DAY   gabapentin (NEURONTIN) 300 MG capsule Take 1 capsule (300 mg total) by mouth 3 (three) times daily.   glucose blood (TRUE METRIX BLOOD GLUCOSE TEST) test strip TEST BLOOD SUGAR EVERY DAY (Patient not  taking: Reported on 03/01/2021)   lisinopril-hydrochlorothiazide (ZESTORETIC) 20-25 MG tablet Take 1 tablet by mouth daily.   loratadine (CLARITIN) 10 MG tablet TAKE 1 TABLET EVERY DAY   metFORMIN (GLUCOPHAGE-XR) 500 MG 24 hr tablet Take 1 tablet (500 mg total) by mouth daily with breakfast.   naproxen (NAPROSYN) 500 MG tablet TAKE 1 TABLET TWICE DAILY WITH MEALS   omeprazole (PRILOSEC) 40 MG capsule TAKE 1 CAPSULE EVERY MORNING   potassium gluconate 595 (99 K) MG TABS tablet Take 99 mg by mouth daily.   promethazine-dextromethorphan (PROMETHAZINE-DM) 6.25-15 MG/5ML syrup Take 5 mLs by mouth at bedtime as  needed for cough. (Patient not taking: Reported on 03/01/2021)   pseudoephedrine (SUDAFED) 30 MG tablet Take 1 tablet (30 mg total) by mouth every 8 (eight) hours as needed for congestion. (Patient not taking: Reported on 03/01/2021)   sildenafil (REVATIO) 20 MG tablet Take 1-2 tablets (20-40 mg total) by mouth daily as needed.   simvastatin (ZOCOR) 40 MG tablet Take 1 tablet (40 mg total) by mouth at bedtime.   sodium chloride (OCEAN) 0.65 % SOLN nasal spray Place 1 spray into both nostrils as needed for congestion.   TRUEplus Lancets 33G MISC TEST BLOOD SUGAR EVERY DAY (Patient not taking: Reported on 03/01/2021)   No facility-administered encounter medications on file as of 03/29/2021.    Past Surgical History:  Procedure Laterality Date   ANTERIOR CERVICAL DECOMP/DISCECTOMY FUSION N/A 02/15/2020   Procedure: ANTERIOR CERVICAL DECOMPRESSON FUSION CERVICAL THREE-FOUR, CERVICAL FOUR-FIVE,CERVICAL FIVE-SIX.;  Surgeon: Consuella Lose, MD;  Location: [place];  Service: Neurosurgery;  Laterality: N/A;  anterior   CERVICAL FUSION  1990's   EVALUATION UNDER ANESTHESIA WITH ANAL FISTULECTOMY N/A 08/03/2012   Procedure: EXAM UNDER ANESTHESIA WITH ANAL FISTULECTOMY ;  Surgeon: Leighton Ruff, MD;  Location: [place];  Service: General;  Laterality: N/A;   INCISION AND DRAINAGE PERIRECTAL ABSCESS  02/13/2012   Procedure: IRRIGATION AND DEBRIDEMENT PERIRECTAL ABSCESS;  Surgeon: Earnstine Regal, MD;  Location: [place];  Service: General;  Laterality: N/A;   LUMBAR FUSION  2010   has had 2 low back surgeries    RIGHT HYDROCELECTOMY  07-28-2001   SHOULDER ARTHROSCOPY WITH ROTATOR CUFF REPAIR Left 74's    Family History  Problem Relation Age of Onset   Brain cancer Mother        tumor   Diabetes Mother    Kidney Stones Father    Diabetes Sister    Heart disease Sister    GI problems Sister    Cancer Brother        prostate   Cancer Brother        lymphoma    Heart disease Brother     Blindness Son    Arthritis Son        trouble with legs   Heart disease Paternal Uncle    Arthritis Son        shoulder and back       Controlled substance contract: n/a     Review of Systems  Constitutional:  Negative for diaphoresis.  Eyes:  Negative for pain.  Respiratory:  Negative for shortness of breath.   Cardiovascular:  Negative for chest pain, palpitations and leg swelling.  Gastrointestinal:  Negative for abdominal pain.  Endocrine: Negative for polydipsia.  Musculoskeletal:  Positive for arthralgias (right shoulder).  Skin:  Negative for rash.  Neurological:  Negative for dizziness, weakness and headaches.  Hematological:  Does not bruise/bleed easily.  All other  systems reviewed and are negative.     Objective:   Physical Exam Vitals and nursing note reviewed.  Constitutional:      Appearance: Normal appearance. He is well-developed.  HENT:     Head: Normocephalic.     Nose: Nose normal.     Mouth/Throat:     Mouth: Mucous membranes are moist.     Pharynx: Oropharynx is clear.  Eyes:     Pupils: Pupils are equal, round, and reactive to light.  Neck:     Thyroid: No thyroid mass or thyromegaly.     Vascular: No carotid bruit or JVD.     Trachea: Phonation normal.  Cardiovascular:     Rate and Rhythm: Normal rate and regular rhythm.  Pulmonary:     Effort: Pulmonary effort is normal. No respiratory distress.     Breath sounds: Normal breath sounds.  Abdominal:     General: Bowel sounds are normal.     Palpations: Abdomen is soft.     Tenderness: There is no abdominal tenderness.  Musculoskeletal:        General: Normal range of motion.     Cervical back: Normal range of motion and neck supple.     Right lower leg: Edema (2+) present.     Left lower leg: Edema (2+) present.     Comments: Walking with cane for balance  Lymphadenopathy:     Cervical: No cervical adenopathy.  Skin:    General: Skin is warm and dry.  Neurological:     Mental  Status: He is alert and oriented to person, place, and time.  Psychiatric:        Behavior: Behavior normal.        Thought Content: Thought content normal.        Judgment: Judgment normal.    BP 118/76    Pulse 66    Temp 98.5 F (36.9 C) (Temporal)    Resp 20    Ht _0  (1.727 m)    Wt 292 lb (132.5 kg)    SpO2 100%    BMI 44.40 kg/m   Hgba1c 5.4     Assessment & Plan:  [name] in today with chief complaint of Medical Management of Chronic Issues   1. Essential hypertension Low soum diet - CBC with Differential/Platelet - CMP14+EGFR - amLODipine (NORVASC) 5 MG tablet; Take 1 tablet (5 mg total) by mouth daily.  Dispense: 90 tablet; Refill: 1 - lisinopril-hydrochlorothiazide (ZESTORETIC) 20-25 MG tablet; Take 1 tablet by mouth daily.  Dispense: 90 tablet; Refill: 1  2. Type 2 diabetes mellitus with diabetic polyneuropathy, without long-term current use of insulin (HCC) Continue to watch carbs  in diet - Bayer DCA Hb A1c Waived - Ambulatory referral to Podiatry - metFORMIN (GLUCOPHAGE-XR) 500 MG 24 hr tablet; Take 1 tablet (500 mg total) by mouth daily with breakfast.  Dispense: 90 tablet; Refill: 1  3. Mixed hyperlipidemia Low fat diet - Lipid panel - simvastatin (ZOCOR) 40 MG tablet; Take 1 tablet (40 mg total) by mouth at bedtime.  Dispense: 90 tablet; Refill: 1  4. Obstructive sleep apnea syndrome Wear CPAP nightly  5. Neuropathy - gabapentin (NEURONTIN) 300 MG capsule; Take 1 capsule (300 mg total) by mouth 3 (three) times daily.  Dispense: 90 capsule; Refill: 1  6. Severe obesity (BMI >= 40) (HCC) Discussed diet and exercise for person with BMI >25 Will recheck weight in 3-6 months   7. Acute pain of right shoulder Ice bid -  Ambulatory referral to Orthopedic Surgery - meloxicam (MOBIC) 15 MG tablet; Take 1 tablet (15 mg total) by mouth daily.  Dispense: 30 tablet; Refill: 2  8. Erectile dysfunction due to arterial insufficiency - sildenafil  (REVATIO) 20 MG tablet; Take 1-2 tablets (20-40 mg total) by mouth daily as needed.  Dispense: 30 tablet; Refill: 1  9. Gastroesophageal reflux disease with esophagitis Avoid spicy foods Do not eat 2 hours prior to bedtime  - omeprazole (PRILOSEC) 40 MG capsule; TAKE 1 CAPSULE EVERY MORNING  Dispense: 90 capsule; Refill: 1  Follow up in 3 months  The above assessment and management plan was discussed with the patient. The patient verbalized understanding of and has agreed to the management plan. Patient is aware to call the clinic if symptoms persist or worsen. Patient is aware when to return to the clinic for a follow-up visit. Patient educated on when it is appropriate to go to the emergency department.   Mary-Margaret Hassell Done, FNP

## 2021-03-29 NOTE — Patient Instructions (Signed)

## 2021-03-29 NOTE — Addendum Note (Signed)
Addended by: Cleda Daub on: 03/29/2021 03:41 PM   Modules accepted: Orders

## 2021-04-10 DIAGNOSIS — M25511 Pain in right shoulder: Secondary | ICD-10-CM | POA: Diagnosis not present

## 2021-04-10 DIAGNOSIS — M19011 Primary osteoarthritis, right shoulder: Secondary | ICD-10-CM | POA: Diagnosis not present

## 2021-04-10 DIAGNOSIS — M542 Cervicalgia: Secondary | ICD-10-CM | POA: Diagnosis not present

## 2021-04-23 ENCOUNTER — Other Ambulatory Visit: Payer: Self-pay | Admitting: *Deleted

## 2021-04-23 DIAGNOSIS — K2100 Gastro-esophageal reflux disease with esophagitis, without bleeding: Secondary | ICD-10-CM

## 2021-04-23 DIAGNOSIS — E1142 Type 2 diabetes mellitus with diabetic polyneuropathy: Secondary | ICD-10-CM

## 2021-04-23 DIAGNOSIS — I10 Essential (primary) hypertension: Secondary | ICD-10-CM

## 2021-04-25 ENCOUNTER — Other Ambulatory Visit: Payer: Self-pay

## 2021-04-25 DIAGNOSIS — I10 Essential (primary) hypertension: Secondary | ICD-10-CM

## 2021-04-25 DIAGNOSIS — M25511 Pain in right shoulder: Secondary | ICD-10-CM

## 2021-04-25 DIAGNOSIS — G629 Polyneuropathy, unspecified: Secondary | ICD-10-CM

## 2021-04-25 DIAGNOSIS — E782 Mixed hyperlipidemia: Secondary | ICD-10-CM

## 2021-05-09 ENCOUNTER — Other Ambulatory Visit: Payer: Self-pay | Admitting: Nurse Practitioner

## 2021-05-09 DIAGNOSIS — G629 Polyneuropathy, unspecified: Secondary | ICD-10-CM

## 2021-05-31 ENCOUNTER — Other Ambulatory Visit: Payer: Self-pay | Admitting: Nurse Practitioner

## 2021-05-31 DIAGNOSIS — M25511 Pain in right shoulder: Secondary | ICD-10-CM

## 2021-06-06 ENCOUNTER — Other Ambulatory Visit: Payer: Self-pay | Admitting: Nurse Practitioner

## 2021-06-06 DIAGNOSIS — E1142 Type 2 diabetes mellitus with diabetic polyneuropathy: Secondary | ICD-10-CM

## 2021-06-06 DIAGNOSIS — E782 Mixed hyperlipidemia: Secondary | ICD-10-CM

## 2021-06-06 DIAGNOSIS — I10 Essential (primary) hypertension: Secondary | ICD-10-CM

## 2021-06-06 DIAGNOSIS — K2100 Gastro-esophageal reflux disease with esophagitis, without bleeding: Secondary | ICD-10-CM

## 2021-07-10 ENCOUNTER — Other Ambulatory Visit: Payer: Self-pay | Admitting: Nurse Practitioner

## 2021-07-12 ENCOUNTER — Other Ambulatory Visit: Payer: Self-pay | Admitting: *Deleted

## 2021-07-12 DIAGNOSIS — M25511 Pain in right shoulder: Secondary | ICD-10-CM

## 2021-07-12 MED ORDER — MELOXICAM 15 MG PO TABS
15.0000 mg | ORAL_TABLET | Freq: Every day | ORAL | 0 refills | Status: DC
Start: 2021-07-12 — End: 2021-09-28

## 2021-07-19 ENCOUNTER — Other Ambulatory Visit: Payer: Self-pay | Admitting: Nurse Practitioner

## 2021-07-19 DIAGNOSIS — E782 Mixed hyperlipidemia: Secondary | ICD-10-CM

## 2021-09-24 ENCOUNTER — Other Ambulatory Visit: Payer: Self-pay | Admitting: Nurse Practitioner

## 2021-09-28 ENCOUNTER — Ambulatory Visit (INDEPENDENT_AMBULATORY_CARE_PROVIDER_SITE_OTHER): Payer: Medicare HMO | Admitting: Nurse Practitioner

## 2021-09-28 ENCOUNTER — Other Ambulatory Visit: Payer: Self-pay | Admitting: Nurse Practitioner

## 2021-09-28 ENCOUNTER — Encounter: Payer: Self-pay | Admitting: Nurse Practitioner

## 2021-09-28 VITALS — BP 127/78 | HR 87 | Temp 98.7°F | Resp 20 | Ht 68.0 in | Wt 302.0 lb

## 2021-09-28 DIAGNOSIS — M4802 Spinal stenosis, cervical region: Secondary | ICD-10-CM

## 2021-09-28 DIAGNOSIS — M5412 Radiculopathy, cervical region: Secondary | ICD-10-CM

## 2021-09-28 DIAGNOSIS — I10 Essential (primary) hypertension: Secondary | ICD-10-CM | POA: Diagnosis not present

## 2021-09-28 DIAGNOSIS — E782 Mixed hyperlipidemia: Secondary | ICD-10-CM | POA: Diagnosis not present

## 2021-09-28 DIAGNOSIS — M5441 Lumbago with sciatica, right side: Secondary | ICD-10-CM | POA: Diagnosis not present

## 2021-09-28 DIAGNOSIS — K2100 Gastro-esophageal reflux disease with esophagitis, without bleeding: Secondary | ICD-10-CM

## 2021-09-28 DIAGNOSIS — G4733 Obstructive sleep apnea (adult) (pediatric): Secondary | ICD-10-CM | POA: Diagnosis not present

## 2021-09-28 DIAGNOSIS — M5442 Lumbago with sciatica, left side: Secondary | ICD-10-CM

## 2021-09-28 DIAGNOSIS — Z6841 Body Mass Index (BMI) 40.0 and over, adult: Secondary | ICD-10-CM

## 2021-09-28 DIAGNOSIS — E1142 Type 2 diabetes mellitus with diabetic polyneuropathy: Secondary | ICD-10-CM

## 2021-09-28 DIAGNOSIS — Z23 Encounter for immunization: Secondary | ICD-10-CM

## 2021-09-28 DIAGNOSIS — M25511 Pain in right shoulder: Secondary | ICD-10-CM

## 2021-09-28 DIAGNOSIS — G8929 Other chronic pain: Secondary | ICD-10-CM

## 2021-09-28 DIAGNOSIS — G992 Myelopathy in diseases classified elsewhere: Secondary | ICD-10-CM

## 2021-09-28 DIAGNOSIS — G629 Polyneuropathy, unspecified: Secondary | ICD-10-CM

## 2021-09-28 LAB — BAYER DCA HB A1C WAIVED: HB A1C (BAYER DCA - WAIVED): 6.1 % — ABNORMAL HIGH (ref 4.8–5.6)

## 2021-09-28 MED ORDER — METFORMIN HCL ER 500 MG PO TB24: 500.0000 mg | ORAL_TABLET | Freq: Every day | ORAL | 1 refills | Status: DC

## 2021-09-28 MED ORDER — OMEPRAZOLE 40 MG PO CPDR: DELAYED_RELEASE_CAPSULE | ORAL | 1 refills | Status: DC

## 2021-09-28 MED ORDER — AMLODIPINE BESYLATE 5 MG PO TABS: 5.0000 mg | ORAL_TABLET | Freq: Every day | ORAL | 1 refills | Status: DC

## 2021-09-28 MED ORDER — LISINOPRIL-HYDROCHLOROTHIAZIDE 20-25 MG PO TABS: 1.0000 | ORAL_TABLET | Freq: Every day | ORAL | 1 refills | Status: DC

## 2021-09-28 MED ORDER — GABAPENTIN 300 MG PO CAPS: 300.0000 mg | ORAL_CAPSULE | Freq: Three times a day (TID) | ORAL | 1 refills | Status: DC

## 2021-09-28 MED ORDER — SIMVASTATIN 40 MG PO TABS: 40.0000 mg | ORAL_TABLET | Freq: Every day | ORAL | 1 refills | Status: DC

## 2021-09-28 NOTE — Patient Instructions (Signed)
Diabetes Mellitus and Foot Care Foot care is an important part of your health, especially when you have diabetes. Diabetes may cause you to have problems because of poor blood flow (circulation) to your feet and legs, which can cause your skin to: Become thinner and drier. Break more easily. Heal more slowly. Peel and crack. You may also have nerve damage (neuropathy) in your legs and feet, causing decreased feeling in them. This means that you may not notice minor injuries to your feet that could lead to more serious problems. Noticing and addressing any potential problems early is the best way to prevent future foot problems. How to care for your feet Foot hygiene  Wash your feet daily with warm water and mild soap. Do not use hot water. Then, pat your feet and the areas between your toes until they are completely dry. Do not soak your feet as this can dry your skin. Trim your toenails straight across. Do not dig under them or around the cuticle. File the edges of your nails with an emery board or nail file. Apply a moisturizing lotion or petroleum jelly to the skin on your feet and to dry, brittle toenails. Use lotion that does not contain alcohol and is unscented. Do not apply lotion between your toes. Shoes and socks Wear clean socks or stockings every day. Make sure they are not too tight. Do not wear knee-high stockings since they may decrease blood flow to your legs. Wear shoes that fit properly and have enough cushioning. Always look in your shoes before you put them on to be sure there are no objects inside. To break in new shoes, wear them for just a few hours a day. This prevents injuries on your feet. Wounds, scrapes, corns, and calluses  Check your feet daily for blisters, cuts, bruises, sores, and redness. If you cannot see the bottom of your feet, use a mirror or ask someone for help. Do not cut corns or calluses or try to remove them with medicine. If you find a minor scrape,  cut, or break in the skin on your feet, keep it and the skin around it clean and dry. You may clean these areas with mild soap and water. Do not clean the area with peroxide, alcohol, or iodine. If you have a wound, scrape, corn, or callus on your foot, look at it several times a day to make sure it is healing and not infected. Check for: Redness, swelling, or pain. Fluid or blood. Warmth. Pus or a bad smell. General tips Do not cross your legs. This may decrease blood flow to your feet. Do not use heating pads or hot water bottles on your feet. They may burn your skin. If you have lost feeling in your feet or legs, you may not know this is happening until it is too late. Protect your feet from hot and cold by wearing shoes, such as at the beach or on hot pavement. Schedule a complete foot exam at least once a year (annually) or more often if you have foot problems. Report any cuts, sores, or bruises to your health care provider immediately. Where to find more information American Diabetes Association: www.diabetes.org Association of Diabetes Care & Education Specialists: www.diabeteseducator.org Contact a health care provider if: You have a medical condition that increases your risk of infection and you have any cuts, sores, or bruises on your feet. You have an injury that is not healing. You have redness on your legs or feet. You   feel burning or tingling in your legs or feet. You have pain or cramps in your legs and feet. Your legs or feet are numb. Your feet always feel cold. You have pain around any toenails. Get help right away if: You have a wound, scrape, corn, or callus on your foot and: You have pain, swelling, or redness that gets worse. You have fluid or blood coming from the wound, scrape, corn, or callus. Your wound, scrape, corn, or callus feels warm to the touch. You have pus or a bad smell coming from the wound, scrape, corn, or callus. You have a fever. You have a red  line going up your leg. Summary Check your feet every day for blisters, cuts, bruises, sores, and redness. Apply a moisturizing lotion or petroleum jelly to the skin on your feet and to dry, brittle toenails. Wear shoes that fit properly and have enough cushioning. If you have foot problems, report any cuts, sores, or bruises to your health care provider immediately. Schedule a complete foot exam at least once a year (annually) or more often if you have foot problems. This information is not intended to replace advice given to you by your health care provider. Make sure you discuss any questions you have with your health care provider. Document Revised: 08/19/2019 Document Reviewed: 08/19/2019 Elsevier Patient Education  2023 Elsevier Inc.  

## 2021-09-28 NOTE — Progress Notes (Addendum)
Subjective:    Patient ID: CHANCELOR Smith, male    DOB: 1955/10/21, 66 y.o.   MRN: [id number]   Chief Complaint: medical management of chronic issues     HPI:  Evan Smith is a 66 y.o. who identifies as a male who was assigned male at birth.   Social history: Lives with: by hisself Work history: disability   Comes in today for follow up of the following chronic medical issues:  1. Essential hypertension No c/o chest pain, sob or headache. Does not check blood pressure at home. BP Readings from Last 3 Encounters:  03/29/21 118/76  12/26/20 120/86  11/20/20 134/83     2. Mixed hyperlipidemia Does not really watch diet and does little to no dedicated exercise. Lab Results  Component Value Date   CHOL 147 06/13/2020   HDL 52 06/13/2020   LDLCALC 75 06/13/2020   TRIG 110 06/13/2020   CHOLHDL 2.8 06/13/2020   The 10-year ASCVD risk score (Arnett DK, et al., 2019) is: 27.3%   3. Type 2 diabetes mellitus with diabetic polyneuropathy, without long-term current use of insulin (HCC) He does not check his blood sugars very often and can not remember what reading was th last time he checked it. Lab Results  Component Value Date   HGBA1C 5.4 03/29/2021     4. Obstructive sleep apnea syndrome Wears cpap nightly  5. Cervical radiculopathy at C6 6. Stenosis of cervical spine with myelopathy ([facility] 7. Chronic midline low back pain with bilateral sciatica Has daily pain in lower back and neck. Is on no pain medication other than tylenol or motrin.   8. Severe obesity (BMI >= 40) (HCC) Weight is up 10lbs  Wt Readings from Last 3 Encounters:  09/28/21 (!) 302 lb (137 kg)  03/29/21 292 lb (132.5 kg)  03/01/21 290 lb (131.5 kg)   BMI Readings from Last 3 Encounters:  09/28/21 45.92 kg/m  03/29/21 44.40 kg/m  03/01/21 44.09 kg/m     New complaints: None today  Allergies  Allergen Reactions   Hydrocodone     Other reaction(s): itch   Hydrocodeine  [Dihydrocodeine] Nausea And Vomiting   Outpatient Encounter Medications as of 09/28/2021  Medication Sig   Alcohol Swabs (DROPSAFE ALCOHOL PREP) 70 % PADS Check BS QID Dx E11.9   amLODipine (NORVASC) 5 MG tablet TAKE 1 TABLET BY MOUTH DAILY   aspirin 81 MG tablet Take 1 tablet (81 mg total) by mouth daily.   Blood Glucose Calibration (TRUE METRIX LEVEL 1) Low SOLN USE AS DIRECTED   Blood Glucose Monitoring Suppl (TRUE METRIX AIR GLUCOSE METER) w/Device KIT 1 Device by Does not apply route daily. Dx E11.9   Blood Glucose Monitoring Suppl (TRUE METRIX AIR GLUCOSE METER) w/Device KIT USE UP TO FOUR TIMES DAILY AS DIRECTED.   cetirizine (ZYRTEC ALLERGY) 10 MG tablet Take 1 tablet (10 mg total) by mouth daily.   Cholecalciferol (VITAMIN D3) 1000 UNITS CAPS Take 1,000 Units by mouth daily.   fluticasone (FLONASE) 50 MCG/ACT nasal spray USE 2 SPRAYS IN EACH NOSTRIL EVERY DAY   gabapentin (NEURONTIN) 300 MG capsule TAKE 1 CAPSULE BY MOUTH 3 TIMES  DAILY   lisinopril-hydrochlorothiazide (ZESTORETIC) 20-25 MG tablet TAKE 1 TABLET BY MOUTH DAILY   loratadine (CLARITIN) 10 MG tablet TAKE 1 TABLET EVERY DAY   meloxicam (MOBIC) 15 MG tablet Take 1 tablet (15 mg total) by mouth daily.   metFORMIN (GLUCOPHAGE-XR) 500 MG 24 hr tablet TAKE 1 TABLET BY MOUTH  DAILY  WITH BREAKFAST   omeprazole (PRILOSEC) 40 MG capsule TAKE 1 CAPSULE BY MOUTH IN THE  MORNING   potassium gluconate 595 (99 K) MG TABS tablet Take 99 mg by mouth daily.   pseudoephedrine (SUDAFED) 30 MG tablet Take 1 tablet (30 mg total) by mouth every 8 (eight) hours as needed for congestion.   sildenafil (REVATIO) 20 MG tablet Take 1-2 tablets (20-40 mg total) by mouth daily as needed.   simvastatin (ZOCOR) 40 MG tablet TAKE 1 TABLET AT BEDTIME   sodium chloride (OCEAN) 0.65 % SOLN nasal spray Place 1 spray into both nostrils as needed for congestion.   TRUE METRIX BLOOD GLUCOSE TEST test strip TEST BLOOD SUGAR EVERY DAY   TRUEplus Lancets 33G MISC  TEST BLOOD SUGAR EVERY DAY   No facility-administered encounter medications on file as of 09/28/2021.    Past Surgical History:  Procedure Laterality Date   ANTERIOR CERVICAL DECOMP/DISCECTOMY FUSION N/A 02/15/2020   Procedure: ANTERIOR CERVICAL DECOMPRESSON FUSION CERVICAL THREE-FOUR, CERVICAL FOUR-FIVE,CERVICAL FIVE-SIX.;  Surgeon: Consuella Lose, MD;  Location: [facility];  Service: Neurosurgery;  Laterality: N/A;  anterior   CERVICAL FUSION  1990's   EVALUATION UNDER ANESTHESIA WITH ANAL FISTULECTOMY N/A 08/03/2012   Procedure: EXAM UNDER ANESTHESIA WITH ANAL FISTULECTOMY ;  Surgeon: Leighton Ruff, MD;  Location: [facility];  Service: General;  Laterality: N/A;   INCISION AND DRAINAGE PERIRECTAL ABSCESS  02/13/2012   Procedure: IRRIGATION AND DEBRIDEMENT PERIRECTAL ABSCESS;  Surgeon: Earnstine Regal, MD;  Location: [facility];  Service: General;  Laterality: N/A;   LUMBAR FUSION  2010   has had 2 low back surgeries    RIGHT HYDROCELECTOMY  07-28-2001   SHOULDER ARTHROSCOPY WITH ROTATOR CUFF REPAIR Left 50's    Family History  Problem Relation Age of Onset   Brain cancer Mother        tumor   Diabetes Mother    Kidney Stones Father    Diabetes Sister    Heart disease Sister    GI problems Sister    Cancer Brother        prostate   Cancer Brother        lymphoma    Heart disease Brother    Blindness Son    Arthritis Son        trouble with legs   Heart disease Paternal Uncle    Arthritis Son        shoulder and back       Controlled substance contract: n/a     Review of Systems  Constitutional:  Negative for diaphoresis.  Eyes:  Negative for pain.  Respiratory:  Negative for shortness of breath.   Cardiovascular:  Negative for chest pain, palpitations and leg swelling.  Gastrointestinal:  Negative for abdominal pain.  Endocrine: Negative for polydipsia.  Skin:  Negative for rash.  Neurological:  Negative for dizziness, weakness and headaches.   Hematological:  Does not bruise/bleed easily.  All other systems reviewed and are negative.      Objective:   Physical Exam Vitals and nursing note reviewed.  Constitutional:      Appearance: Normal appearance. He is well-developed.  HENT:     Head: Normocephalic.     Nose: Nose normal.     Mouth/Throat:     Mouth: Mucous membranes are moist.     Pharynx: Oropharynx is clear.  Eyes:     Pupils: Pupils are equal, round, and reactive to light.  Neck:  Thyroid: No thyroid mass or thyromegaly.     Vascular: No carotid bruit or JVD.     Trachea: Phonation normal.  Cardiovascular:     Rate and Rhythm: Normal rate and regular rhythm.  Pulmonary:     Effort: Pulmonary effort is normal. No respiratory distress.     Breath sounds: Normal breath sounds.  Abdominal:     General: Bowel sounds are normal.     Palpations: Abdomen is soft.     Tenderness: There is no abdominal tenderness.     Hernia: A hernia (umbilical hernia) is present.  Musculoskeletal:        General: Normal range of motion.     Cervical back: Normal range of motion and neck supple.     Right lower leg: Edema (1+) present.     Left lower leg: Edema (1+) present.  Lymphadenopathy:     Cervical: No cervical adenopathy.  Skin:    General: Skin is warm and dry.  Neurological:     Mental Status: He is alert and oriented to person, place, and time.  Psychiatric:        Behavior: Behavior normal.        Thought Content: Thought content normal.        Judgment: Judgment normal.     BP 127/78   Pulse 87   Temp 98.7 F (37.1 C) (Temporal)   Resp 20   Ht '5\' 8"'  (1.727 m)   Wt (!) 302 lb (137 kg)   SpO2 97%   BMI 45.92 kg/m   HGBA1c 6.1%      Assessment & Plan:  ELONZO SOPP comes in today with chief complaint of Medical Management of Chronic Issues (Difficulty walking due to back pain)   Diagnosis and orders addressed:  1. Essential hypertension Low sodium diet - CBC with  Differential/Platelet - CMP14+EGFR - lisinopril-hydrochlorothiazide (ZESTORETIC) 20-25 MG tablet; Take 1 tablet by mouth daily.  Dispense: 100 tablet; Refill: 1 - amLODipine (NORVASC) 5 MG tablet; Take 1 tablet (5 mg total) by mouth daily.  Dispense: 100 tablet; Refill: 1  2. Mixed hyperlipidemia Low fat diet - Lipid panel - simvastatin (ZOCOR) 40 MG tablet; Take 1 tablet (40 mg total) by mouth at bedtime.  Dispense: 90 tablet; Refill: 1  3. Type 2 diabetes mellitus with diabetic polyneuropathy, without long-term current use of insulin (HCC) Continue  to wtahc carbsi n diet - Bayer DCA Hb A1c Waived - metFORMIN (GLUCOPHAGE-XR) 500 MG 24 hr tablet; Take 1 tablet (500 mg total) by mouth daily with breakfast.  Dispense: 100 tablet; Refill: 1  4. Obstructive sleep apnea syndrome Wear CPCP nightly  5. Cervical radiculopathy at C6 Motrin or tulenol otc as needed  6. Stenosis of cervical spine with myelopathy ([place])  7. Chronic midline low back pain with bilateral sciatica  8. Severe obesity (BMI >= 40) (HCC) Discussed diet and exercise for person with BMI >25 Will recheck weight in 3-6 months  9. Gastroesophageal reflux disease with esophagitis Avoid spicy foods Do not eat 2 hours prior to bedtime - omeprazole (PRILOSEC) 40 MG capsule; TAKE 1 CAPSULE BY MOUTH IN THE  MORNING  Dispense: 100 capsule; Refill: 1  10. Neuropathy - gabapentin (NEURONTIN) 300 MG capsule; Take 1 capsule (300 mg total) by mouth 3 (three) times daily.  Dispense: 270 capsule; Refill: 1   Labs pending Health Maintenance reviewed Diet and exercise encouraged  Follow up plan: 6 months   Mary-Margaret Hassell Done, FNP

## 2021-09-29 LAB — CBC WITH DIFFERENTIAL/PLATELET
Basophils Absolute: 0 10*3/uL (ref 0.0–0.2)
Basos: 0 %
EOS (ABSOLUTE): 0 10*3/uL (ref 0.0–0.4)
Eos: 1 %
Hematocrit: 37.7 % (ref 37.5–51.0)
Hemoglobin: 12.9 g/dL — ABNORMAL LOW (ref 13.0–17.7)
Immature Grans (Abs): 0 10*3/uL (ref 0.0–0.1)
Immature Granulocytes: 0 %
Lymphocytes Absolute: 1.1 10*3/uL (ref 0.7–3.1)
Lymphs: 37 %
MCH: 32.4 pg (ref 26.6–33.0)
MCHC: 34.2 g/dL (ref 31.5–35.7)
MCV: 95 fL (ref 79–97)
Monocytes Absolute: 0.3 10*3/uL (ref 0.1–0.9)
Monocytes: 10 %
Neutrophils Absolute: 1.6 10*3/uL (ref 1.4–7.0)
Neutrophils: 52 %
Platelets: 156 10*3/uL (ref 150–450)
RBC: 3.98 x10E6/uL — ABNORMAL LOW (ref 4.14–5.80)
RDW: 12.6 % (ref 11.6–15.4)
WBC: 3.1 10*3/uL — ABNORMAL LOW (ref 3.4–10.8)

## 2021-09-29 LAB — CMP14+EGFR
ALT: 31 IU/L (ref 0–44)
AST: 29 IU/L (ref 0–40)
Albumin/Globulin Ratio: 1.7 (ref 1.2–2.2)
Albumin: 4.5 g/dL (ref 3.9–4.9)
Alkaline Phosphatase: 79 IU/L (ref 44–121)
BUN/Creatinine Ratio: 11 (ref 10–24)
BUN: 17 mg/dL (ref 8–27)
Bilirubin Total: 0.2 mg/dL (ref 0.0–1.2)
CO2: 23 mmol/L (ref 20–29)
Calcium: 9.4 mg/dL (ref 8.6–10.2)
Chloride: 101 mmol/L (ref 96–106)
Creatinine, Ser: 1.59 mg/dL — ABNORMAL HIGH (ref 0.76–1.27)
Globulin, Total: 2.6 g/dL (ref 1.5–4.5)
Glucose: 120 mg/dL — ABNORMAL HIGH (ref 70–99)
Potassium: 4.8 mmol/L (ref 3.5–5.2)
Sodium: 138 mmol/L (ref 134–144)
Total Protein: 7.1 g/dL (ref 6.0–8.5)
eGFR: 47 mL/min/{1.73_m2} — ABNORMAL LOW (ref >59)

## 2021-09-29 LAB — LIPID PANEL
Chol/HDL Ratio: 2.4 ratio (ref 0.0–5.0)
Cholesterol, Total: 135 mg/dL (ref 100–199)
HDL: 56 mg/dL (ref >39)
LDL Chol Calc (NIH): 57 mg/dL (ref 0–99)
Triglycerides: 128 mg/dL (ref 0–149)
VLDL Cholesterol Cal: 22 mg/dL (ref 5–40)

## 2021-10-01 NOTE — Addendum Note (Signed)
Addended by: Cleda Daub on: 10/01/2021 03:42 PM   Modules accepted: Orders

## 2021-12-04 ENCOUNTER — Ambulatory Visit (INDEPENDENT_AMBULATORY_CARE_PROVIDER_SITE_OTHER): Payer: Medicare HMO | Admitting: Nurse Practitioner

## 2021-12-04 ENCOUNTER — Encounter: Payer: Self-pay | Admitting: Nurse Practitioner

## 2021-12-04 VITALS — BP 106/63 | HR 87 | Temp 98.8°F | Ht 68.0 in | Wt 307.0 lb

## 2021-12-04 DIAGNOSIS — M5441 Lumbago with sciatica, right side: Secondary | ICD-10-CM | POA: Diagnosis not present

## 2021-12-04 DIAGNOSIS — G8929 Other chronic pain: Secondary | ICD-10-CM

## 2021-12-04 DIAGNOSIS — R29898 Other symptoms and signs involving the musculoskeletal system: Secondary | ICD-10-CM | POA: Diagnosis not present

## 2021-12-04 DIAGNOSIS — M5442 Lumbago with sciatica, left side: Secondary | ICD-10-CM | POA: Diagnosis not present

## 2021-12-04 DIAGNOSIS — Z23 Encounter for immunization: Secondary | ICD-10-CM

## 2021-12-04 NOTE — Patient Instructions (Signed)
Weakness Weakness is a lack of strength. You may feel weak all over your body (generalized), or you may feel weak in one part of your body (focal). There are many potential causes of weakness. Sometimes, the cause of your weakness may not be known. Some causes of weakness can be serious, so it is important to see your doctor. Follow these instructions at home: Activity Rest as needed. Try to get enough sleep. Most adults need 7-8 hours of sleep each night. Talk to your doctor about how much sleep you need. Do exercises, such as arm curls and leg raises, for 30 minutes at least 2 days a week or as told by your doctor. Think about working with a physical therapist or trainer to help you get stronger. General instructions  Take over-the-counter and prescription medicines only as told by your doctor. Eat a healthy, well-balanced diet. This includes: Proteins to build muscles, such as lean meats and fish. Fresh fruits and vegetables. Carbohydrates to boost energy, such as whole grains. Drink enough fluid to keep your pee (urine) pale yellow. Keep all follow-up visits. Contact a doctor if: Your weakness does not get better or it gets worse. Your weakness affects your ability to: Think clearly. Do your normal daily activities. Get help right away if: You have sudden weakness on one side of your face or body. You have chest pain. You have trouble breathing or shortness of breath. You have problems with how you see (vision). You have trouble talking or swallowing. You have trouble standing or walking. You are light-headed or faint. These symptoms may be an emergency. Get help right away. Call 911. Do not wait to see if the symptoms will go away. Do not drive yourself to the hospital. Summary Weakness is a lack of strength. You may feel weak all over your body or just in one part of your body. There are many potential causes of weakness. Sometimes, the cause of your weakness may not be  known. Rest as needed, and try to get enough sleep. Most adults need 7-8 hours of sleep each night. Eat a healthy, well-balanced diet. This information is not intended to replace advice given to you by your health care provider. Make sure you discuss any questions you have with your health care provider. Document Revised: 12/31/2020 Document Reviewed: 12/31/2020 Elsevier Patient Education  2023 Elsevier Inc.  

## 2021-12-04 NOTE — Progress Notes (Signed)
Acute Office Visit  Subjective:     Patient ID: Evan Smith, male    DOB: 1955/04/07, 66 y.o.   MRN: [id number]  Chief Complaint  Patient presents with   right knee gives out    No pain, no numbness, no tingling Had cervical neck surgery last january    Knee Pain  The incident occurred more than 1 week ago. The incident occurred at home. There was no injury mechanism. The pain is present in the right knee. Quality: increase weakness. The pain has been Intermittent since onset. Associated symptoms include an inability to bear weight, muscle weakness and tingling. Pertinent negatives include no loss of motion, loss of sensation or numbness. He reports no foreign bodies present. The symptoms are aggravated by movement. He has tried nothing for the symptoms.    Review of Systems  Constitutional: Negative.  Negative for chills and fever.  HENT: Negative.    Eyes: Negative.   Respiratory: Negative.    Cardiovascular: Negative.   Skin: Negative.  Negative for itching and rash.  Neurological:  Positive for tingling. Negative for numbness.  All other systems reviewed and are negative.       Objective:    BP 106/63   Pulse 87   Temp 98.8 F (37.1 C)   Ht 5\' 8"  (1.727 m)   Wt (!) 307 lb (139.3 kg)   SpO2 96%   BMI 46.68 kg/m  BP Readings from Last 3 Encounters:  12/04/21 106/63  09/28/21 127/78  03/29/21 118/76      Physical Exam Vitals and nursing note reviewed.  Constitutional:      Appearance: Normal appearance. He is obese.  HENT:     Head: Normocephalic.     Right Ear: External ear normal.     Left Ear: External ear normal.     Nose: Nose normal.     Mouth/Throat:     Mouth: Mucous membranes are moist.     Pharynx: Oropharynx is clear.  Eyes:     Conjunctiva/sclera: Conjunctivae normal.  Cardiovascular:     Rate and Rhythm: Normal rate and regular rhythm.     Pulses: Normal pulses.     Heart sounds: Normal heart sounds.  Abdominal:     General:  Bowel sounds are normal.  Skin:    General: Skin is warm.     Findings: No erythema or rash.  Neurological:     General: No focal deficit present.     Mental Status: He is alert and oriented to person, place, and time.  Psychiatric:        Mood and Affect: Mood normal.        Behavior: Behavior normal.     No results found for any visits on 12/04/21.      Assessment & Plan:  Patient presents with worsening weakness in right knee. Patient reports that he is unable to bear weight and his knees give out on him, causing him to fail. He has been using a walker to help steady himself while walking and was brought into the clinic today in a wheel chair.   Completed assessment, patient has a history of chronic DDD and back pain with multiple surgeries. Neurologically patients neuropathy is worse and he is experiencing more tingling and numbness in his bilateral upper extremity.   DME order completed for a heavy duty Rolator,   Patient will schedule an appointment with PCP for chronic disease management.   Problem List Items Addressed This Visit  Nervous and Auditory   Chronic midline low back pain with bilateral sciatica   Relevant Orders   For home use only DME Other see comment   Other Visit Diagnoses     Weakness of right lower extremity    -  Primary   Relevant Orders   For home use only DME Other see comment   Need for immunization against influenza       Relevant Orders   Flu Vaccine QUAD High Dose(Fluad) (Completed)       No orders of the defined types were placed in this encounter.   Return if symptoms worsen or fail to improve.  Ivy Lynn, NP

## 2022-02-21 ENCOUNTER — Other Ambulatory Visit: Payer: Self-pay | Admitting: Nurse Practitioner

## 2022-02-21 DIAGNOSIS — M25511 Pain in right shoulder: Secondary | ICD-10-CM

## 2022-02-21 DIAGNOSIS — E1142 Type 2 diabetes mellitus with diabetic polyneuropathy: Secondary | ICD-10-CM

## 2022-02-21 DIAGNOSIS — I10 Essential (primary) hypertension: Secondary | ICD-10-CM

## 2022-02-21 DIAGNOSIS — E782 Mixed hyperlipidemia: Secondary | ICD-10-CM

## 2022-02-21 DIAGNOSIS — K2100 Gastro-esophageal reflux disease with esophagitis, without bleeding: Secondary | ICD-10-CM

## 2022-02-22 ENCOUNTER — Ambulatory Visit (INDEPENDENT_AMBULATORY_CARE_PROVIDER_SITE_OTHER): Payer: Medicare HMO | Admitting: Nurse Practitioner

## 2022-02-22 ENCOUNTER — Encounter: Payer: Self-pay | Admitting: Nurse Practitioner

## 2022-02-22 VITALS — BP 131/83 | HR 74 | Temp 97.9°F | Resp 20 | Ht 68.0 in | Wt 303.0 lb

## 2022-02-22 DIAGNOSIS — G4733 Obstructive sleep apnea (adult) (pediatric): Secondary | ICD-10-CM

## 2022-02-22 DIAGNOSIS — H6993 Unspecified Eustachian tube disorder, bilateral: Secondary | ICD-10-CM | POA: Diagnosis not present

## 2022-02-22 MED ORDER — FLUTICASONE PROPIONATE 50 MCG/ACT NA SUSP: 2.0000 | Freq: Every day | NASAL | 1 refills | Status: DC

## 2022-02-22 NOTE — Progress Notes (Signed)
   Subjective:    Patient ID: Evan Smith, male    DOB: [birth year], 67 y.o.   MRN: [id number]   Chief Complaint: CPAP supplies and Ears stopped up   HPI Patient come sin today with 2 requests: - he has insomnia and uses CPAP. He need new CPAP supplies - both ears feel stopped up- started around thanksgiving when he had a head cold. Has affected his hering some.    Review of Systems  Constitutional:  Negative for diaphoresis.  HENT:  Negative for congestion, ear discharge and ear pain.   Eyes:  Negative for pain.  Respiratory:  Negative for shortness of breath.   Cardiovascular:  Negative for chest pain, palpitations and leg swelling.  Gastrointestinal:  Negative for abdominal pain.  Endocrine: Negative for polydipsia.  Skin:  Negative for rash.  Neurological:  Negative for dizziness, weakness and headaches.  Hematological:  Does not bruise/bleed easily.  All other systems reviewed and are negative.      Objective:   Physical Exam Vitals reviewed.  Constitutional:      Appearance: Normal appearance. He is obese.  HENT:     Right Ear: A middle ear effusion is present. There is no impacted cerumen. Tympanic membrane is not erythematous.     Left Ear: A middle ear effusion is present. There is no impacted cerumen. Tympanic membrane is not erythematous.  Cardiovascular:     Rate and Rhythm: Normal rate and regular rhythm.  Pulmonary:     Effort: Pulmonary effort is normal.     Breath sounds: Normal breath sounds.  Skin:    General: Skin is warm.  Neurological:     General: No focal deficit present.     Mental Status: He is alert and oriented to person, place, and time.  Psychiatric:        Mood and Affect: Mood normal.        Behavior: Behavior normal.    BP 131/83   Pulse 74   Temp 97.9 F (36.6 C) (Temporal)   Resp 20   Ht 5\' 8"  (1.727 m)   Wt (!) 303 lb (137.4 kg)   SpO2 98%   BMI 46.07 kg/m         Assessment & Plan:   Evan Smith in today  with chief complaint of CPAP supplies and Ears stopped up   1. Dysfunction of both eustachian tubes Force fluids Continue claritin nightly - fluticasone (FLONASE) 50 MCG/ACT nasal spray; Place 2 sprays into both nostrils daily.  Dispense: 48 g; Refill: 1  2. Obstructive sleep apnea syndrome Cpap supplies orderer    The above assessment and management plan was discussed with the patient. The patient verbalized understanding of and has agreed to the management plan. Patient is aware to call the clinic if symptoms persist or worsen. Patient is aware when to return to the clinic for a follow-up visit. Patient educated on when it is appropriate to go to the emergency department.   Mary-Margaret Hassell Done, FNP

## 2022-02-22 NOTE — Patient Instructions (Signed)
Eustachian Tube Dysfunction  Eustachian tube dysfunction refers to a condition in which a blockage develops in the narrow passage that connects the middle ear to the back of the nose (eustachian tube). The eustachian tube regulates air pressure in the middle ear by letting air move between the ear and nose. It also helps to drain fluid from the middle ear space. Eustachian tube dysfunction can affect one or both ears. When the eustachian tube does not function properly, air pressure, fluid, or both can build up in the middle ear. What are the causes? This condition occurs when the eustachian tube becomes blocked or cannot open normally. Common causes of this condition include: Ear infections. Colds and other infections that affect the nose, mouth, and throat (upper respiratory tract). Allergies. Irritation from cigarette smoke. Irritation from stomach acid coming up into the esophagus (gastroesophageal reflux). The esophagus is the part of the body that moves food from the mouth to the stomach. Sudden changes in air pressure, such as from descending in an airplane or scuba diving. Abnormal growths in the nose or throat, such as: Growths that line the nose (nasal polyps). Abnormal growth of cells (tumors). Enlarged tissue at the back of the throat (adenoids). What increases the risk? You are more likely to develop this condition if: You smoke. You are overweight. You are a child who has: Certain birth defects of the mouth, such as cleft palate. Large tonsils or adenoids. What are the signs or symptoms? Common symptoms of this condition include: A feeling of fullness in the ear. Ear pain. Clicking or popping noises in the ear. Ringing in the ear (tinnitus). Hearing loss. Loss of balance. Dizziness. Symptoms may get worse when the air pressure around you changes, such as when you travel to an area of high elevation, fly on an airplane, or go scuba diving. How is this diagnosed? This  condition may be diagnosed based on: Your symptoms. A physical exam of your ears, nose, and throat. Tests, such as those that measure: The movement of your eardrum. Your hearing (audiometry). How is this treated? Treatment depends on the cause and severity of your condition. In mild cases, you may relieve your symptoms by moving air into your ears. This is called "popping the ears." In more severe cases, or if you have symptoms of fluid in your ears, treatment may include: Medicines to relieve congestion (decongestants). Medicines that treat allergies (antihistamines). Nasal sprays or ear drops that contain medicines that reduce swelling (steroids). A procedure to drain the fluid in your eardrum. In this procedure, a small tube may be placed in the eardrum to: Drain the fluid. Restore the air in the middle ear space. A procedure to insert a balloon device through the nose to inflate the opening of the eustachian tube (balloon dilation). Follow these instructions at home: Lifestyle Do not do any of the following until your health care provider approves: Travel to high altitudes. Fly in airplanes. Work in a pressurized cabin or room. Scuba dive. Do not use any products that contain nicotine or tobacco. These products include cigarettes, chewing tobacco, and vaping devices, such as e-cigarettes. If you need help quitting, ask your health care provider. Keep your ears dry. Wear fitted earplugs during showering and bathing. Dry your ears completely after. General instructions Take over-the-counter and prescription medicines only as told by your health care provider. Use techniques to help pop your ears as recommended by your health care provider. These may include: Chewing gum. Yawning. Frequent, forceful swallowing.   Closing your mouth, holding your nose closed, and gently blowing as if you are trying to blow air out of your nose. Keep all follow-up visits. This is important. Contact a  health care provider if: Your symptoms do not go away after treatment. Your symptoms come back after treatment. You are unable to pop your ears. You have: A fever. Pain in your ear. Pain in your head or neck. Fluid draining from your ear. Your hearing suddenly changes. You become very dizzy. You lose your balance. Get help right away if: You have a sudden, severe increase in any of your symptoms. Summary Eustachian tube dysfunction refers to a condition in which a blockage develops in the eustachian tube. It can be caused by ear infections, allergies, inhaled irritants, or abnormal growths in the nose or throat. Symptoms may include ear pain or fullness, hearing loss, or ringing in the ears. Mild cases are treated with techniques to unblock the ears, such as yawning or chewing gum. More severe cases are treated with medicines or procedures. This information is not intended to replace advice given to you by your health care provider. Make sure you discuss any questions you have with your health care provider. Document Revised: 04/10/2020 Document Reviewed: 04/10/2020 Elsevier Patient Education  2023 Elsevier Inc.  

## 2022-03-04 ENCOUNTER — Ambulatory Visit: Payer: Medicare HMO

## 2022-03-04 VITALS — Ht 68.0 in | Wt 300.0 lb

## 2022-03-04 DIAGNOSIS — Z5320 Procedure and treatment not carried out because of patient's decision for unspecified reasons: Secondary | ICD-10-CM

## 2022-03-04 DIAGNOSIS — Z0000 Encounter for general adult medical examination without abnormal findings: Secondary | ICD-10-CM

## 2022-03-04 DIAGNOSIS — Z0100 Encounter for examination of eyes and vision without abnormal findings: Secondary | ICD-10-CM

## 2022-03-04 NOTE — Progress Notes (Signed)
Subjective:   Evan Smith is a 67 y.o. male who presents for Medicare Annual/Subsequent preventive examination. I connected with  [name] on 03/04/22 by a audio enabled telemedicine application and verified that I am speaking with the correct person using two identifiers.  Patient Location: Home  Provider Location: Home Office  I discussed the limitations of evaluation and management by telemedicine. The patient expressed understanding and agreed to proceed.  Review of Systems     Cardiac Risk Factors include: advanced age (>29men, >57 women);diabetes mellitus;hypertension;male gender     Objective:    Today's Vitals   03/04/22 0841  Weight: 300 lb (136.1 kg)  Height: 5\' 8"  (1.727 m)   Body mass index is 45.61 kg/m.     03/04/2022    8:45 AM 03/01/2021    8:26 AM 02/28/2020    8:33 AM 02/15/2020    7:04 AM 02/09/2020   11:23 AM 08/18/2018    2:39 PM 03/27/2017    9:22 AM  Advanced Directives  Does Patient Have a Medical Advance Directive? No No No No No No No  Would patient like information on creating a medical advance directive? No - Patient declined No - Patient declined No - Patient declined No - Patient declined Yes (MAU/Ambulatory/Procedural Areas - Information given) Yes (MAU/Ambulatory/Procedural Areas - Information given) Yes (MAU/Ambulatory/Procedural Areas - Information given)    Current Medications (verified) Outpatient Encounter Medications as of 03/04/2022  Medication Sig   Alcohol Swabs (DROPSAFE ALCOHOL PREP) 70 % PADS Check BS QID Dx E11.9   amLODipine (NORVASC) 5 MG tablet TAKE 1 TABLET EVERY DAY   aspirin 81 MG tablet Take 1 tablet (81 mg total) by mouth daily.   Blood Glucose Calibration (TRUE METRIX LEVEL 1) Low SOLN USE AS DIRECTED   Blood Glucose Monitoring Suppl (TRUE METRIX AIR GLUCOSE METER) w/Device KIT 1 Device by Does not apply route daily. Dx E11.9   Blood Glucose Monitoring Suppl (TRUE METRIX AIR GLUCOSE METER) w/Device KIT USE UP  TO FOUR TIMES DAILY AS DIRECTED.   cetirizine (ZYRTEC ALLERGY) 10 MG tablet Take 1 tablet (10 mg total) by mouth daily.   Cholecalciferol (VITAMIN D3) 1000 UNITS CAPS Take 1,000 Units by mouth daily.   fluticasone (FLONASE) 50 MCG/ACT nasal spray Place 2 sprays into both nostrils daily.   gabapentin (NEURONTIN) 300 MG capsule Take 1 capsule (300 mg total) by mouth 3 (three) times daily.   lisinopril-hydrochlorothiazide (ZESTORETIC) 20-25 MG tablet TAKE 1 TABLET EVERY DAY   loratadine (CLARITIN) 10 MG tablet TAKE 1 TABLET EVERY DAY   meloxicam (MOBIC) 15 MG tablet TAKE 1 TABLET EVERY DAY   metFORMIN (GLUCOPHAGE-XR) 500 MG 24 hr tablet TAKE 1 TABLET EVERY DAY WITH BREAKFAST   omeprazole (PRILOSEC) 40 MG capsule TAKE 1 CAPSULE IN THE MORNING   potassium gluconate 595 (99 K) MG TABS tablet Take 99 mg by mouth daily.   sildenafil (REVATIO) 20 MG tablet Take 1-2 tablets (20-40 mg total) by mouth daily as needed.   simvastatin (ZOCOR) 40 MG tablet TAKE 1 TABLET AT BEDTIME   sodium chloride (OCEAN) 0.65 % SOLN nasal spray Place 1 spray into both nostrils as needed for congestion.   TRUE METRIX BLOOD GLUCOSE TEST test strip TEST BLOOD SUGAR EVERY DAY   TRUEplus Lancets 33G MISC TEST BLOOD SUGAR EVERY DAY   pseudoephedrine (SUDAFED) 30 MG tablet Take 1 tablet (30 mg total) by mouth every 8 (eight) hours as needed for congestion. (Patient not taking: Reported  on 03/04/2022)   No facility-administered encounter medications on file as of 03/04/2022.    Allergies (verified) Hydrocodone and Hydrocodeine [dihydrocodeine]   History: Past Medical History:  Diagnosis Date   Allergic rhinitis    by referral on 11/02/19/   Arthritis    Cataract    Chronic left shoulder pain    From [facility] received 11/02/19/   DDD (degenerative disc disease)    Dyspnea    on exertion    GERD (gastroesophageal reflux disease)    Hyperlipidemia    Hypertension    Intestinal flu 2000   OSA on CPAP    MODERATE OSA PER  STUDY 2010   Perianal fistula    PONV (postoperative nausea and vomiting)    and hear to wake   Severe obesity (BMI >= 40) ([place])    Received from [facility] on 11/02/19/   Type 2 diabetes mellitus with diabetic polyneuropathy, without long-term current use of insulin ([place])    pt. states he's borderline   Past Surgical History:  Procedure Laterality Date   ANTERIOR CERVICAL DECOMP/DISCECTOMY FUSION N/A 02/15/2020   Procedure: ANTERIOR CERVICAL DECOMPRESSON FUSION CERVICAL THREE-FOUR, CERVICAL FOUR-FIVE,CERVICAL FIVE-SIX.;  Surgeon: Consuella Lose, MD;  Location: [place];  Service: Neurosurgery;  Laterality: N/A;  anterior   CERVICAL FUSION  1990's   EVALUATION UNDER ANESTHESIA WITH ANAL FISTULECTOMY N/A 08/03/2012   Procedure: EXAM UNDER ANESTHESIA WITH ANAL FISTULECTOMY ;  Surgeon: Leighton Ruff, MD;  Location: [place];  Service: General;  Laterality: N/A;   INCISION AND DRAINAGE PERIRECTAL ABSCESS  02/13/2012   Procedure: IRRIGATION AND DEBRIDEMENT PERIRECTAL ABSCESS;  Surgeon: Earnstine Regal, MD;  Location: [facility];  Service: General;  Laterality: N/A;   LUMBAR FUSION  2010   has had 2 low back surgeries    RIGHT HYDROCELECTOMY  07-28-2001   SHOULDER ARTHROSCOPY WITH ROTATOR CUFF REPAIR Left 43's   Family History  Problem Relation Age of Onset   Brain cancer Mother        tumor   Diabetes Mother    Kidney Stones Father    Diabetes Sister    Heart disease Sister    GI problems Sister    Cancer Brother        prostate   Cancer Brother        lymphoma    Heart disease Brother    Blindness Son    Arthritis Son        trouble with legs   Heart disease Paternal Uncle    Arthritis Son        shoulder and back    Social History   Socioeconomic History   Marital status: Divorced    Spouse name: Not on file   Number of children: 2   Years of education: Not on file   Highest education level: 11th grade  Occupational History   Occupation: Disabled    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, music - disability   Tobacco Use   Smoking status: Former    Packs/day: 0.50    Years: 25.00    Total pack years: 12.50    Types: Cigarettes    Quit date: 02/12/2004    Years since quitting: 18.0   Smokeless tobacco: Never  Vaping Use   Vaping Use: Never used  Substance and Sexual Activity   Alcohol use: Yes    Comment: occassional   Drug use: No   Sexual activity: Yes  Other Topics Concern   Not on file  Social  History Narrative   Lives by self   No caffeine, very seldom   Right handed   Lives alone on ground level senior apartment   Social Determinants of Health   Financial Resource Strain: Low Risk  (03/04/2022)   Overall Financial Resource Strain (CARDIA)    Difficulty of Paying Living Expenses: Not hard at all  Food Insecurity: No Food Insecurity (03/04/2022)   Hunger Vital Sign    Worried About Running Out of Food in the Last Year: Never true    Ran Out of Food in the Last Year: Never true  Transportation Needs: No Transportation Needs (03/04/2022)   PRAPARE - Administrator, Civil ServiceTransportation    Lack of Transportation (Medical): No    Lack of Transportation (Non-Medical): No  Physical Activity: Insufficiently Active (03/04/2022)   Exercise Vital Sign    Days of Exercise per Week: 3 days    Minutes of Exercise per Session: 30 min  Stress: No Stress Concern Present (03/04/2022)   [employer] - Occupational Stress Questionnaire    Feeling of Stress : Not at all  Social Connections: Moderately Isolated (03/04/2022)   Social Connection and Isolation Panel [NHANES]    Frequency of Communication with Friends and Family: More than three times a week    Frequency of Social Gatherings with Friends and Family: More than three times a week    Attends Religious Services: More than 4 times per year    Active Member of [employer] Financial[employer]ubs or Organizations: No    Attends Engineer, structuralClub or Organization Meetings: Never    Marital Status: Divorced    Tobacco Counseling Counseling  given: Not Answered   Clinical Intake:  Pre-visit preparation completed: Yes  Pain : No/denies pain     Nutritional Risks: None Diabetes: Yes CBG done?: No Did pt. bring in CBG monitor from home?: No  How often do you need to have someone help you when you read instructions, pamphlets, or other written materials from your doctor or pharmacy?: 1 - Never  Diabetic?yes Nutrition Risk Assessment:  Has the patient had any N/V/D within the last 2 months?  No  Does the patient have any non-healing wounds?  No  Has the patient had any unintentional weight loss or weight gain?  No   Diabetes:  Is the patient diabetic?  Yes  If diabetic, was a CBG obtained today?  No  Did the patient bring in their glucometer from home?  No  How often do you monitor your CBG's? 2 x week .   Financial Strains and Diabetes Management:  Are you having any financial strains with the device, your supplies or your medication? No .  Does the patient want to be seen by Chronic Care Management for management of their diabetes?  No  Would the patient like to be referred to a Nutritionist or for Diabetic Management?  No   Diabetic Exams:  Diabetic Eye Exam: Overdue for diabetic eye exam. Pt has been advised about the importance in completing this exam. Patient advised to call and schedule an eye exam. Diabetic Foot Exam: Overdue, Pt has been advised about the importance in completing this exam. Pt is scheduled for diabetic foot exam on next office visit .   Interpreter Needed?: No  Information entered by :: Renie OraLaura Wilson, LPN   Activities of Daily Living    03/04/2022    8:45 AM  In your present state of health, do you have any difficulty performing the following activities:  Hearing? 0  Vision?  0  Difficulty concentrating or making decisions? 0  Walking or climbing stairs? 0  Dressing or bathing? 0  Doing errands, shopping? 0  Preparing Food and eating ? N  Using the Toilet? N  In the past six  months, have you accidently leaked urine? N  Do you have problems with loss of bowel control? N  Managing your Medications? N  Managing your Finances? N  Housekeeping or managing your Housekeeping? N    Patient Care Team: Bennie Pierini, FNP as PCP - General (Family Medicine) Darnell Level, MD as Consulting Physician (General Surgery) West Bali, MD (Inactive) as Consulting Physician (Gastroenterology) Dalbert Mayotte, MD (Orthopedic Surgery) Shirlean Kelly, MD as Consulting Physician (Neurosurgery) Hilda Lias, MD as Attending Physician (Neurosurgery)  Indicate any recent Medical Services you may have received from other than [facility] providers in the past year (date may be approximate).     Assessment:   This is a routine wellness examination for Oluwaferanmi.  Hearing/Vision screen Vision Screening - Comments:: Referral 03/04/2022  Dietary issues and exercise activities discussed: Current Exercise Habits: Home exercise routine, Type of exercise: walking, Time (Minutes): 30, Frequency (Times/Week): 3, Weekly Exercise (Minutes/Week): 90, Exercise limited by: None identified   Goals Addressed             This Visit's Progress    Increase physical activity   On track    Start walking  Get active        Depression Screen    03/04/2022    8:44 AM 02/22/2022   10:30 AM 12/04/2021    3:35 PM 09/28/2021    9:46 AM 03/29/2021   11:00 AM 03/01/2021    8:33 AM 06/13/2020   11:12 AM  PHQ 2/9 Scores  PHQ - 2 Score 0 0 0 0 0 0 0  PHQ- 9 Score 0 0 2 1 2       Fall Risk    03/04/2022    8:42 AM 02/22/2022   10:30 AM 09/28/2021    9:46 AM 03/29/2021   11:00 AM 03/01/2021    8:32 AM  Fall Risk   Falls in the past year? 0 0 1 1 1   Number falls in past yr: 0  0 0 1  Injury with Fall? 0  0 0 0  Risk for fall due to : No Fall Risks  History of fall(s) History of fall(s) History of fall(s);Impaired balance/gait;Orthopedic patient  Follow up Falls prevention discussed   Education provided Education provided Education provided;Falls prevention discussed    FALL RISK PREVENTION PERTAINING TO THE HOME:  Any stairs in or around the home? No  If so, are there any without handrails? No  Home free of loose throw rugs in walkways, pet beds, electrical cords, etc? Yes  Adequate lighting in your home to reduce risk of falls? Yes   ASSISTIVE DEVICES UTILIZED TO PREVENT FALLS:  Life alert? No  Use of a cane, walker or w/c? Yes  Grab bars in the bathroom? Yes  Shower chair or bench in shower? No  Elevated toilet seat or a handicapped toilet? No       03/27/2017    9:25 AM 03/22/2016    8:19 AM 03/21/2016    3:48 PM 03/09/2015    8:43 AM  MMSE - Mini Mental State Exam  Orientation to time 5  5 5   Orientation to Place 5  5 5   Registration 3  3 3   Attention/ Calculation 5  3 4   Recall  3  3 3   Language- name 2 objects 2  2 2   Language- repeat 1  1 1   Language- follow 3 step command 3  3 3   Language- read & follow direction 1  1 1   Write a sentence 1  1 1   Copy design 1 1 1 1   Total score 30  28 29         03/04/2022    8:45 AM 03/01/2021    8:29 AM 02/28/2020    8:35 AM 08/18/2018    2:41 PM  6CIT Screen  What Year? 0 points 0 points  0 points  What month? 0 points 0 points  0 points  What time? 0 points 0 points 0 points 0 points  Count back from 20 0 points 0 points 0 points 0 points  Months in reverse 0 points 0 points 0 points 0 points  Repeat phrase 0 points 0 points 2 points 0 points  Total Score 0 points 0 points  0 points    Immunizations Immunization History  Administered Date(s) Administered   Fluad Quad(high Dose 65+) 11/15/2019, 12/26/2020, 12/04/2021   Influenza,inj,Quad PF,6+ Mos 11/24/2013, 01/13/2015, 11/24/2015, 11/26/2016, 12/02/2017, 11/12/2018   Moderna Sars-Covid-2 Vaccination 03/28/2019, 04/25/2019, 12/30/2019   Pneumococcal Conjugate-13 12/02/2017   Pneumococcal Polysaccharide-23 08/19/2019   Zoster Recombinat (Shingrix)  03/29/2021, 09/28/2021    TDAP status: Due, Education has been provided regarding the importance of this vaccine. Advised may receive this vaccine at local pharmacy or Health Dept. Aware to provide a copy of the vaccination record if obtained from local pharmacy or Health Dept. Verbalized acceptance and understanding.  Flu Vaccine status: Up to date  Pneumococcal vaccine status: Up to date  Covid-19 vaccine status: Completed vaccines  Qualifies for Shingles Vaccine? Yes   Zostavax completed Yes   Shingrix Completed?: Yes  Screening Tests Health Maintenance  Topic Date Due   DTaP/Tdap/Td (1 - Tdap) Never done   OPHTHALMOLOGY EXAM  01/11/2021   Diabetic kidney evaluation - Urine ACR  12/26/2021   COVID-19 Vaccine (4 - 2023-24 season) 03/10/2022 (Originally 10/12/2021)   HEMOGLOBIN A1C  03/31/2022   Fecal DNA (Cologuard)  07/06/2022   Diabetic kidney evaluation - eGFR measurement  09/29/2022   FOOT EXAM  09/29/2022   Medicare Annual Wellness (AWV)  03/05/2023   Pneumonia Vaccine 39+ Years old  Completed   INFLUENZA VACCINE  Completed   Hepatitis C Screening  Completed   Zoster Vaccines- Shingrix  Completed   HPV VACCINES  Aged Out   COLONOSCOPY (Pts 45-65yrs Insurance coverage will need to be confirmed)  Discontinued    Health Maintenance  Health Maintenance Due  Topic Date Due   DTaP/Tdap/Td (1 - Tdap) Never done   OPHTHALMOLOGY EXAM  01/11/2021   Diabetic kidney evaluation - Urine ACR  12/26/2021    Colorectal cancer screening: Type of screening: Cologuard. Completed 07/06/2019. Repeat every 3 years  Lung Cancer Screening: (Low Dose CT Chest recommended if Age 44-80 years, 30 pack-year currently smoking OR have quit w/in 15years.) does qualify.   Lung Cancer Screening Referral: 03/04/2022 Quit 10 years ago   Additional Screening:  Hepatitis C Screening: does not qualify;   Vision Screening: Recommended annual ophthalmology exams for early detection of glaucoma and  other disorders of the eye. Is the patient up to date with their annual eye exam?  No  Who is the provider or what is the name of the office in which the patient attends annual eye exams? None referral 03/04/2022  If pt is not established with a provider, would they like to be referred to a provider to establish care? No .   Dental Screening: Recommended annual dental exams for proper oral hygiene  Community Resource Referral / Chronic Care Management: CRR required this visit?  No   CCM required this visit?  No      Plan:     I have personally reviewed and noted the following in the patient's chart:   Medical and social history Use of alcohol, tobacco or illicit drugs  Current medications and supplements including opioid prescriptions. Patient is not currently taking opioid prescriptions. Functional ability and status Nutritional status Physical activity Advanced directives List of other physicians Hospitalizations, surgeries, and ER visits in previous 12 months Vitals Screenings to include cognitive, depression, and falls Referrals and appointments  In addition, I have reviewed and discussed with patient certain preventive protocols, quality metrics, and best practice recommendations. A written personalized care plan for preventive services as well as general preventive health recommendations were provided to patient.     Lorrene Reid, LPN   [id number]   Nurse Notes: referral eye exam and lung cancer screening quit 10years ago

## 2022-03-04 NOTE — Patient Instructions (Signed)
[name] , Thank you for taking time to come for your Medicare Wellness Visit. I appreciate your ongoing commitment to your health goals. Please review the following plan we discussed and let me know if I can assist you in the future.   These are the goals we discussed:  Goals       "I need some help with my feet" (pt-stated)      Current Barriers:  Chronic Disease Management support and education needs related to diabetic foot care  Nurse Case Manager Clinical Goal(s):  Over the next 90 days, patient will follow-up with podiatrist as recommended  Interventions:  Advised patient to continue checking feet daily looking for any new calluses, blisters, breaks in the skin, or other changes in the skin and/or toenails Discussed plans with patient for ongoing care management follow up and provided patient with direct contact information for care management team Chart reviewed Appointment was scheduled with Evan Smith on 03/30/2019 Talked with patient by phone Discussed podiatry visit. He was able to see Evan Smith on 03/30/2019. He was advised to follow-up in 2 to 3 months but no follow-up appointment was made.  Reached out to Evan Evan Smith office [phone] to see if they will contact him to schedule a follow-up but they were closed due to severe winter weather. Left voicemail requesting that they reach back out to him to schedule that follow-up appt.   Patient Self Care Activities:  Performs ADL's independently Performs IADL's independently  Please see past updates related to this goal by clicking on the "Past Updates" button in the selected goal        "I woule like to monitor my blood pressure at home" (pt-stated)      Current Barriers:  Chronic Disease Management support and education needs related to hypertension  Nurse Case Manager Clinical Goal(s):  Over the next 30 days, patient will obtain home blood pressure monitor  Interventions:  Discussed need to monitor blood pressure at  home regularly Advised that [facility] does not cover blood pressure monitors Recommended that patient purchase monitor for home use. [employer] has some of the lowest prices locally Recommended that patient bring blood pressure  monitor with him to next PCP appointment so that they can compare readings from his machine to the office machine  Patient Self Care Activities:  Performs ADL's independently Performs IADL's independently  Please see past updates related to this goal by clicking on the "Past Updates" button in the selected goal        "I'd like to manage my pain better" (pt-stated)      Current Barriers:  Chronic Disease Management support and education needs related to neuropathy  Nurse Case Manager Clinical Goal(s):  Over the next 30 days, patient will work with [facility] to address needs related to neuropathy pain management  Interventions:  Evaluation of current treatment plan related to neuropathy and patient's adherence to plan as established by provider. Reviewed medications with patient and discussed Gabapentin 300mg  Reviewed scheduled/upcoming provider appointments including: 10/12/18 Discussed HPI Has been taking gabapentin 300mg  for many years. Doesn't think it is helping very much Takes Tylenol OTC PRN.   RNCM planned collaboration with PCP regarding potential increase in gabapentin Continue to avoid NSAIDs due to elevated kidney functions and DM  Patient Self Care Activities:  Performs ADL's independently Performs IADL's independently  Initial goal documentation       Increase physical activity      Start walking  Get active  Obtain New Cpap Machine and Supplies      Sleep apnea in a patient with hypertension and diabetes.   Current Barriers:  Knowledge Deficits related to how to obtain new CPAP machine and supplies Chronic Disease Management support and education needs related to sleep apnea  Nurse Case Manager Clinical Goal(s):   Over the next 30 days, patient will work with PCP clinical staff to obtain new CPAP machine and to schedule a sleep study if necessary for insurance coverage  Interventions:  Previously Talked with patient by telephone CPAP machine is approx 68 years old and was purchased from [employer]. He was getting supplies from [place].  Last sleep study was approx 10 years ago Chart reviewed Clinical staff reached out to [facility] regarding CPAP settings.  Order for new machine was placed on 03/16/2019 by PCP Collaborated with [facility] Clinical staff regarding order for new machine.  Advised patient that order has been printed and encouraged him to reach out if he has not heard anything from the DME supplier over the next 2 weeks  Patient Self Care Activities:  Performs ADL's independently Performs IADL's independently   Please see past updates related to this goal by clicking on the "Past Updates" button in the selected goal        Patient Stated      02/28/2020 AWV Goal: Fall Prevention  Over the next year, patient will decrease their risk for falls by: Using assistive devices, such as a cane or walker, as needed Identifying fall risks within their home and correcting them by: Removing throw rugs Adding handrails to stairs or ramps Removing clutter and keeping a clear pathway throughout the home Increasing light, especially at night Adding shower handles/bars Raising toilet seat Identifying potential personal risk factors for falls: Medication side effects Incontinence/urgency Vestibular dysfunction Hearing loss Musculoskeletal disorders Neurological disorders Orthostatic hypotension        Prevent falls        This is a list of the screening recommended for you and due dates:  Health Maintenance  Topic Date Due   DTaP/Tdap/Td vaccine (1 - Tdap) Never done   Eye exam for diabetics  01/11/2021   Yearly kidney health urinalysis for diabetes  12/26/2021    COVID-19 Vaccine (4 - 2023-24 season) 03/10/2022*   Hemoglobin A1C  03/31/2022   Cologuard (Stool DNA test)  07/06/2022   Yearly kidney function blood test for diabetes  09/29/2022   Complete foot exam   09/29/2022   Medicare Annual Wellness Visit  03/05/2023   Pneumonia Vaccine  Completed   Flu Shot  Completed   Hepatitis C Screening: USPSTF Recommendation to screen - Ages 18-79 yo.  Completed   Zoster (Shingles) Vaccine  Completed   HPV Vaccine  Aged Out   Colon Cancer Screening  Discontinued  *Topic was postponed. The date shown is not the original due date.    Advanced directives: Advance directive discussed with you today. I have provided a copy for you to complete at home and have notarized. Once this is complete please bring a copy in to our office so we can scan it into your chart.   Conditions/risks identified: Aim for 30 minutes of exercise or brisk walking, 6-8 glasses of water, and 5 servings of fruits and vegetables each day.   Next appointment: Follow up in one year for your annual wellness visit.   Preventive Care 53 Years and Older, Male  Preventive care refers to lifestyle choices and visits with your  health care provider that can promote health and wellness. What does preventive care include? A yearly physical exam. This is also called an annual well check. Dental exams once or twice a year. Routine eye exams. Ask your health care provider how often you should have your eyes checked. Personal lifestyle choices, including: Daily care of your teeth and gums. Regular physical activity. Eating a healthy diet. Avoiding tobacco and drug use. Limiting alcohol use. Practicing safe sex. Taking low doses of aspirin every day. Taking vitamin and mineral supplements as recommended by your health care provider. What happens during an annual well check? The services and screenings done by your health care provider during your annual well check will depend on your age,  overall health, lifestyle risk factors, and family history of disease. Counseling  Your health care provider may ask you questions about your: Alcohol use. Tobacco use. Drug use. Emotional well-being. Home and relationship well-being. Sexual activity. Eating habits. History of falls. Memory and ability to understand (cognition). Work and work Statistician. Screening  You may have the following tests or measurements: Height, weight, and BMI. Blood pressure. Lipid and cholesterol levels. These may be checked every 5 years, or more frequently if you are over 24 years old. Skin check. Lung cancer screening. You may have this screening every year starting at age 56 if you have a 30-pack-year history of smoking and currently smoke or have quit within the past 15 years. Fecal occult blood test (FOBT) of the stool. You may have this test every year starting at age 34. Flexible sigmoidoscopy or colonoscopy. You may have a sigmoidoscopy every 5 years or a colonoscopy every 10 years starting at age 20. Prostate cancer screening. Recommendations will vary depending on your family history and other risks. Hepatitis C blood test. Hepatitis B blood test. Sexually transmitted disease (STD) testing. Diabetes screening. This is done by checking your blood sugar (glucose) after you have not eaten for a while (fasting). You may have this done every 1-3 years. Abdominal aortic aneurysm (AAA) screening. You may need this if you are a current or former smoker. Osteoporosis. You may be screened starting at age 56 if you are at high risk. Talk with your health care provider about your test results, treatment options, and if necessary, the need for more tests. Vaccines  Your health care provider may recommend certain vaccines, such as: Influenza vaccine. This is recommended every year. Tetanus, diphtheria, and acellular pertussis (Tdap, Td) vaccine. You may need a Td booster every 10 years. Zoster vaccine.  You may need this after age 25. Pneumococcal 13-valent conjugate (PCV13) vaccine. One dose is recommended after age 12. Pneumococcal polysaccharide (PPSV23) vaccine. One dose is recommended after age 29. Talk to your health care provider about which screenings and vaccines you need and how often you need them. This information is not intended to replace advice given to you by your health care provider. Make sure you discuss any questions you have with your health care provider. Document Released: 02/24/2015 Document Revised: 10/18/2015 Document Reviewed: 11/29/2014 Elsevier Interactive Patient Education  2017 [place] Prevention in the Home Falls can cause injuries. They can happen to people of all ages. There are many things you can do to make your home safe and to help prevent falls. What can I do on the outside of my home? Regularly fix the edges of walkways and driveways and fix any cracks. Remove anything that might make you trip as you walk through a door,  such as a raised step or threshold. Trim any bushes or trees on the path to your home. Use bright outdoor lighting. Clear any walking paths of anything that might make someone trip, such as rocks or tools. Regularly check to see if handrails are loose or broken. Make sure that both sides of any steps have handrails. Any raised decks and porches should have guardrails on the edges. Have any leaves, snow, or ice cleared regularly. Use sand or salt on walking paths during winter. Clean up any spills in your garage right away. This includes oil or grease spills. What can I do in the bathroom? Use night lights. Install grab bars by the toilet and in the tub and shower. Do not use towel bars as grab bars. Use non-skid mats or decals in the tub or shower. If you need to sit down in the shower, use a plastic, non-slip stool. Keep the floor dry. Clean up any water that spills on the floor as soon as it happens. Remove soap  buildup in the tub or shower regularly. Attach bath mats securely with double-sided non-slip rug tape. Do not have throw rugs and other things on the floor that can make you trip. What can I do in the bedroom? Use night lights. Make sure that you have a light by your bed that is easy to reach. Do not use any sheets or blankets that are too big for your bed. They should not hang down onto the floor. Have a firm chair that has side arms. You can use this for support while you get dressed. Do not have throw rugs and other things on the floor that can make you trip. What can I do in the kitchen? Clean up any spills right away. Avoid walking on wet floors. Keep items that you use a lot in easy-to-reach places. If you need to reach something above you, use a strong step stool that has a grab bar. Keep electrical cords out of the way. Do not use floor polish or wax that makes floors slippery. If you must use wax, use non-skid floor wax. Do not have throw rugs and other things on the floor that can make you trip. What can I do with my stairs? Do not leave any items on the stairs. Make sure that there are handrails on both sides of the stairs and use them. Fix handrails that are broken or loose. Make sure that handrails are as long as the stairways. Check any carpeting to make sure that it is firmly attached to the stairs. Fix any carpet that is loose or worn. Avoid having throw rugs at the top or bottom of the stairs. If you do have throw rugs, attach them to the floor with carpet tape. Make sure that you have a light switch at the top of the stairs and the bottom of the stairs. If you do not have them, ask someone to add them for you. What else can I do to help prevent falls? Wear shoes that: Do not have high heels. Have rubber bottoms. Are comfortable and fit you well. Are closed at the toe. Do not wear sandals. If you use a stepladder: Make sure that it is fully opened. Do not climb a closed  stepladder. Make sure that both sides of the stepladder are locked into place. Ask someone to hold it for you, if possible. Clearly mark and make sure that you can see: Any grab bars or handrails. First and last steps. Where  the edge of each step is. Use tools that help you move around (mobility aids) if they are needed. These include: Canes. Walkers. Scooters. Crutches. Turn on the lights when you go into a dark area. Replace any light bulbs as soon as they burn out. Set up your furniture so you have a clear path. Avoid moving your furniture around. If any of your floors are uneven, fix them. If there are any pets around you, be aware of where they are. Review your medicines with your doctor. Some medicines can make you feel dizzy. This can increase your chance of falling. Ask your doctor what other things that you can do to help prevent falls. This information is not intended to replace advice given to you by your health care provider. Make sure you discuss any questions you have with your health care provider. Document Released: 11/24/2008 Document Revised: 07/06/2015 Document Reviewed: 03/04/2014 Elsevier Interactive Patient Education  2017 [employer].

## 2022-03-26 ENCOUNTER — Telehealth: Payer: Self-pay | Admitting: Nurse Practitioner

## 2022-03-26 ENCOUNTER — Other Ambulatory Visit: Payer: Self-pay | Admitting: Nurse Practitioner

## 2022-03-26 DIAGNOSIS — G4733 Obstructive sleep apnea (adult) (pediatric): Secondary | ICD-10-CM

## 2022-03-26 NOTE — Progress Notes (Signed)
Patient needs to come pick prescripton up for CPAP machine  Evan Hassell Done, FNP

## 2022-03-26 NOTE — Telephone Encounter (Signed)
I may be looking in the wrong spot, but I couldn't find an order for CPAP machine.

## 2022-04-01 ENCOUNTER — Ambulatory Visit (INDEPENDENT_AMBULATORY_CARE_PROVIDER_SITE_OTHER): Payer: Medicare HMO | Admitting: Nurse Practitioner

## 2022-04-01 ENCOUNTER — Encounter: Payer: Self-pay | Admitting: Nurse Practitioner

## 2022-04-01 VITALS — BP 129/79 | HR 92 | Temp 98.4°F | Resp 20 | Ht 68.0 in | Wt 304.0 lb

## 2022-04-01 DIAGNOSIS — K2100 Gastro-esophageal reflux disease with esophagitis, without bleeding: Secondary | ICD-10-CM | POA: Diagnosis not present

## 2022-04-01 DIAGNOSIS — G629 Polyneuropathy, unspecified: Secondary | ICD-10-CM

## 2022-04-01 DIAGNOSIS — M25511 Pain in right shoulder: Secondary | ICD-10-CM | POA: Diagnosis not present

## 2022-04-01 DIAGNOSIS — M5441 Lumbago with sciatica, right side: Secondary | ICD-10-CM

## 2022-04-01 DIAGNOSIS — H6993 Unspecified Eustachian tube disorder, bilateral: Secondary | ICD-10-CM | POA: Diagnosis not present

## 2022-04-01 DIAGNOSIS — I10 Essential (primary) hypertension: Secondary | ICD-10-CM

## 2022-04-01 DIAGNOSIS — E782 Mixed hyperlipidemia: Secondary | ICD-10-CM | POA: Diagnosis not present

## 2022-04-01 DIAGNOSIS — M5442 Lumbago with sciatica, left side: Secondary | ICD-10-CM

## 2022-04-01 DIAGNOSIS — E1142 Type 2 diabetes mellitus with diabetic polyneuropathy: Secondary | ICD-10-CM | POA: Diagnosis not present

## 2022-04-01 DIAGNOSIS — Z6841 Body Mass Index (BMI) 40.0 and over, adult: Secondary | ICD-10-CM

## 2022-04-01 DIAGNOSIS — Z0001 Encounter for general adult medical examination with abnormal findings: Secondary | ICD-10-CM | POA: Diagnosis not present

## 2022-04-01 DIAGNOSIS — Z23 Encounter for immunization: Secondary | ICD-10-CM

## 2022-04-01 DIAGNOSIS — G8929 Other chronic pain: Secondary | ICD-10-CM

## 2022-04-01 DIAGNOSIS — N5201 Erectile dysfunction due to arterial insufficiency: Secondary | ICD-10-CM | POA: Insufficient documentation

## 2022-04-01 DIAGNOSIS — Z0000 Encounter for general adult medical examination without abnormal findings: Secondary | ICD-10-CM

## 2022-04-01 LAB — BAYER DCA HB A1C WAIVED: HB A1C (BAYER DCA - WAIVED): 6.4 % — ABNORMAL HIGH (ref 4.8–5.6)

## 2022-04-01 MED ORDER — OMEPRAZOLE 40 MG PO CPDR: DELAYED_RELEASE_CAPSULE | ORAL | 1 refills | Status: DC

## 2022-04-01 MED ORDER — SILDENAFIL CITRATE 20 MG PO TABS: 20.0000 mg | ORAL_TABLET | Freq: Every day | ORAL | 1 refills | Status: DC | PRN

## 2022-04-01 MED ORDER — METFORMIN HCL ER 500 MG PO TB24: ORAL_TABLET | ORAL | 1 refills | Status: DC

## 2022-04-01 MED ORDER — GABAPENTIN 300 MG PO CAPS: 300.0000 mg | ORAL_CAPSULE | Freq: Three times a day (TID) | ORAL | 1 refills | Status: DC

## 2022-04-01 MED ORDER — PREDNISONE 20 MG PO TABS: 40.0000 mg | ORAL_TABLET | Freq: Every day | ORAL | 0 refills | Status: AC

## 2022-04-01 MED ORDER — LISINOPRIL-HYDROCHLOROTHIAZIDE 20-25 MG PO TABS: 1.0000 | ORAL_TABLET | Freq: Every day | ORAL | 1 refills | Status: DC

## 2022-04-01 MED ORDER — SIMVASTATIN 40 MG PO TABS: 40.0000 mg | ORAL_TABLET | Freq: Every day | ORAL | 1 refills | Status: DC

## 2022-04-01 MED ORDER — AMLODIPINE BESYLATE 5 MG PO TABS: 5.0000 mg | ORAL_TABLET | Freq: Every day | ORAL | 1 refills | Status: DC

## 2022-04-01 NOTE — Patient Instructions (Signed)
Shoulder Pain Many things can cause shoulder pain, including: An injury. Moving the shoulder in the same way again and again (overuse). Joint pain (arthritis). Pain can come from: Swelling and irritation (inflammation) of any part of the shoulder. An injury to the shoulder joint. An injury to: Tissues that connect muscle to bone (tendons). Tissues that connect bones to each other (ligaments). Bones. Follow these instructions at home: Watch for changes in your symptoms. Let your doctor know about them. Follow these instructions to help with your pain. If you have a sling: Wear the sling as told by your doctor. Remove it only as told by your doctor. Loosen the sling if your fingers: Tingle. Become numb. Turn cold and blue. Keep the sling clean. If the sling is not waterproof: Do not let it get wet. Take the sling off when you shower or bathe. Managing pain, stiffness, and swelling  If told, put ice on the painful area: Put ice in a plastic bag. Place a towel between your skin and the bag. Leave the ice on for 20 minutes, 2-3 times a day. Stop putting ice on if it does not help with the pain. Squeeze a soft ball or a foam pad as much as possible. This prevents swelling in the shoulder. It also helps to strengthen the arm. General instructions Take over-the-counter and prescription medicines only as told by your doctor. Keep all follow-up visits as told by your doctor. This is important. Contact a doctor if: Your pain gets worse. Medicine does not help your pain. You have new pain in your arm, hand, or fingers. Get help right away if: Your arm, hand, or fingers: Tingle. Are numb. Are swollen. Are painful. Turn white or blue. Summary Shoulder pain can be caused by many things. These include injury, moving the shoulder in the same away again and again, and joint pain. Watch for changes in your symptoms. Let your doctor know about them. This condition may be treated with a  sling, ice, and pain medicine. Contact your doctor if the pain gets worse or you have new pain. Get help right away if your arm, hand, or fingers tingle or get numb, swollen, or painful. Keep all follow-up visits as told by your doctor. This is important. This information is not intended to replace advice given to you by your health care provider. Make sure you discuss any questions you have with your health care provider. Document Revised: 10/13/2020 Document Reviewed: 10/13/2020 Elsevier Patient Education  [place].

## 2022-04-01 NOTE — Progress Notes (Signed)
Subjective:    Patient ID: Evan Smith, male    DOB: Apr 14, 1955, 67 y.o.   MRN: [id number]   Chief Complaint: annual physical    HPI:  Evan Smith is a 66 y.o. who identifies as a male who was assigned male at birth.   Social history: Lives with: sisters and mom Work history: disability   Comes in today for follow up of the following chronic medical issues:  1. Essential hypertension No c/o chest pain, sob or headache. Does not check blood pressure at home. BP Readings from Last 3 Encounters:  02/22/22 131/83  12/04/21 106/63  09/28/21 127/78     2. Mixed hyperlipidemia Does not watch diet and does no exercise. Lab Results  Component Value Date   CHOL 135 09/28/2021   HDL 56 09/28/2021   LDLCALC 57 09/28/2021   TRIG 128 09/28/2021   CHOLHDL 2.4 09/28/2021     3. Type 2 diabetes mellitus with diabetic polyneuropathy, without long-term current use of insulin (HCC) Does not check blood sugars every day. Lab Results  Component Value Date   HGBA1C 6.1 (H) 09/28/2021     4. Chronic midline low back pain with bilateral sciatica Takes neurontin daily for his back pain. Says it is tolerable  5. GERD Is on omeprazole daily and is doing well.   6. Severe obesity (BMI >= 40) (HCC) No recent weight changes Wt Readings from Last 3 Encounters:  04/01/22 (!) 304 lb (137.9 kg)  03/04/22 300 lb (136.1 kg)  02/22/22 (!) 303 lb (137.4 kg)   BMI Readings from Last 3 Encounters:  04/01/22 46.22 kg/m  03/04/22 45.61 kg/m  02/22/22 46.07 kg/m      New complaints: - ears still stopped p. He is using flonase with o help - arthritis in right shoulder- hurts to move it. He has had steroid injection in it in th epast.   Allergies  Allergen Reactions   Hydrocodone     Other reaction(s): itch   Hydrocodeine [Dihydrocodeine] Nausea And Vomiting   Outpatient Encounter Medications as of 04/01/2022  Medication Sig   Alcohol Swabs (DROPSAFE ALCOHOL PREP) 70  % PADS Check BS QID Dx E11.9   amLODipine (NORVASC) 5 MG tablet TAKE 1 TABLET EVERY DAY   aspirin 81 MG tablet Take 1 tablet (81 mg total) by mouth daily.   Blood Glucose Calibration (TRUE METRIX LEVEL 1) Low SOLN USE AS DIRECTED   Blood Glucose Monitoring Suppl (TRUE METRIX AIR GLUCOSE METER) w/Device KIT 1 Device by Does not apply route daily. Dx E11.9   Blood Glucose Monitoring Suppl (TRUE METRIX AIR GLUCOSE METER) w/Device KIT USE UP TO FOUR TIMES DAILY AS DIRECTED.   cetirizine (ZYRTEC ALLERGY) 10 MG tablet Take 1 tablet (10 mg total) by mouth daily.   Cholecalciferol (VITAMIN D3) 1000 UNITS CAPS Take 1,000 Units by mouth daily.   fluticasone (FLONASE) 50 MCG/ACT nasal spray Place 2 sprays into both nostrils daily.   gabapentin (NEURONTIN) 300 MG capsule Take 1 capsule (300 mg total) by mouth 3 (three) times daily.   lisinopril-hydrochlorothiazide (ZESTORETIC) 20-25 MG tablet TAKE 1 TABLET EVERY DAY   loratadine (CLARITIN) 10 MG tablet TAKE 1 TABLET EVERY DAY   meloxicam (MOBIC) 15 MG tablet TAKE 1 TABLET EVERY DAY   metFORMIN (GLUCOPHAGE-XR) 500 MG 24 hr tablet TAKE 1 TABLET EVERY DAY WITH BREAKFAST   omeprazole (PRILOSEC) 40 MG capsule TAKE 1 CAPSULE IN THE MORNING   potassium gluconate 595 (99 K) MG TABS  tablet Take 99 mg by mouth daily.   pseudoephedrine (SUDAFED) 30 MG tablet Take 1 tablet (30 mg total) by mouth every 8 (eight) hours as needed for congestion. (Patient not taking: Reported on 03/04/2022)   sildenafil (REVATIO) 20 MG tablet Take 1-2 tablets (20-40 mg total) by mouth daily as needed.   simvastatin (ZOCOR) 40 MG tablet TAKE 1 TABLET AT BEDTIME   sodium chloride (OCEAN) 0.65 % SOLN nasal spray Place 1 spray into both nostrils as needed for congestion.   TRUE METRIX BLOOD GLUCOSE TEST test strip TEST BLOOD SUGAR EVERY DAY   TRUEplus Lancets 33G MISC TEST BLOOD SUGAR EVERY DAY   No facility-administered encounter medications on file as of 04/01/2022.    Past Surgical  History:  Procedure Laterality Date   ANTERIOR CERVICAL DECOMP/DISCECTOMY FUSION N/A 02/15/2020   Procedure: ANTERIOR CERVICAL DECOMPRESSON FUSION CERVICAL THREE-FOUR, CERVICAL FOUR-FIVE,CERVICAL FIVE-SIX.;  Surgeon: Consuella Lose, MD;  Location: [facility];  Service: Neurosurgery;  Laterality: N/A;  anterior   CERVICAL FUSION  1990's   EVALUATION UNDER ANESTHESIA WITH ANAL FISTULECTOMY N/A 08/03/2012   Procedure: EXAM UNDER ANESTHESIA WITH ANAL FISTULECTOMY ;  Surgeon: Leighton Ruff, MD;  Location: [facility];  Service: General;  Laterality: N/A;   INCISION AND DRAINAGE PERIRECTAL ABSCESS  02/13/2012   Procedure: IRRIGATION AND DEBRIDEMENT PERIRECTAL ABSCESS;  Surgeon: Earnstine Regal, MD;  Location: [facility];  Service: General;  Laterality: N/A;   LUMBAR FUSION  2010   has had 2 low back surgeries    RIGHT HYDROCELECTOMY  07-28-2001   SHOULDER ARTHROSCOPY WITH ROTATOR CUFF REPAIR Left 8's    Family History  Problem Relation Age of Onset   Brain cancer Mother        tumor   Diabetes Mother    Kidney Stones Father    Diabetes Sister    Heart disease Sister    GI problems Sister    Cancer Brother        prostate   Cancer Brother        lymphoma    Heart disease Brother    Blindness Son    Arthritis Son        trouble with legs   Heart disease Paternal Uncle    Arthritis Son        shoulder and back       Controlled substance contract: n/a     Review of Systems  Constitutional:  Negative for diaphoresis.  Eyes:  Negative for pain.  Respiratory:  Negative for shortness of breath.   Cardiovascular:  Negative for chest pain, palpitations and leg swelling.  Gastrointestinal:  Negative for abdominal pain.  Endocrine: Negative for polydipsia.  Musculoskeletal:  Positive for arthralgias (right shoulder).  Skin:  Negative for rash.  Neurological:  Negative for dizziness, weakness and headaches.  Hematological:  Does not bruise/bleed easily.  All other systems  reviewed and are negative.      Objective:   Physical Exam Vitals and nursing note reviewed.  Constitutional:      Appearance: Normal appearance. He is well-developed.  HENT:     Head: Normocephalic.     Right Ear: A middle ear effusion is present.     Left Ear: A middle ear effusion is present.     Nose: Nose normal.     Mouth/Throat:     Mouth: Mucous membranes are moist.     Pharynx: Oropharynx is clear.  Eyes:     Pupils: Pupils are equal, round,  and reactive to light.  Neck:     Thyroid: No thyroid mass or thyromegaly.     Vascular: No carotid bruit or JVD.     Trachea: Phonation normal.  Cardiovascular:     Rate and Rhythm: Normal rate and regular rhythm.  Pulmonary:     Effort: Pulmonary effort is normal. No respiratory distress.     Breath sounds: Normal breath sounds.  Abdominal:     General: Bowel sounds are normal.     Palpations: Abdomen is soft.     Tenderness: There is no abdominal tenderness.  Musculoskeletal:        General: Normal range of motion.     Cervical back: Normal range of motion and neck supple.     Comments: Ambulating with cane Decreased ROM of right shoulder due  to pain with abduction and internal rotation Grips equal bil  Lymphadenopathy:     Cervical: No cervical adenopathy.  Skin:    General: Skin is warm and dry.  Neurological:     Mental Status: He is alert and oriented to person, place, and time.  Psychiatric:        Behavior: Behavior normal.        Thought Content: Thought content normal.        Judgment: Judgment normal.     BP 129/79   Pulse 92   Temp 98.4 F (36.9 C) (Temporal)   Resp 20   Ht 5' 8"$  (1.727 m)   Wt (!) 304 lb (137.9 kg)   SpO2 98%   BMI 46.22 kg/m   Hgba1c 6.4%     Assessment & Plan:  Evan Smith comes in today with chief complaint of Annual Exam (Ears feel stopped up/Right shoulder pain/Back pain/)   Diagnosis and orders addressed:  1. Essential hypertension Low sodium diet - CBC  with Differential/Platelet - CMP14+EGFR - amLODipine (NORVASC) 5 MG tablet; Take 1 tablet (5 mg total) by mouth daily.  Dispense: 90 tablet; Refill: 1 - lisinopril-hydrochlorothiazide (ZESTORETIC) 20-25 MG tablet; Take 1 tablet by mouth daily.  Dispense: 90 tablet; Refill: 1  2. Mixed hyperlipidemia Low fat diet - Lipid panel - simvastatin (ZOCOR) 40 MG tablet; Take 1 tablet (40 mg total) by mouth at bedtime.  Dispense: 90 tablet; Refill: 1  3. Type 2 diabetes mellitus with diabetic polyneuropathy, without long-term current use of insulin (HCC) Stricet diet while on steroids- they will increase blood sugars - Bayer DCA Hb A1c Waived - Microalbumin / creatinine urine ratio - metFORMIN (GLUCOPHAGE-XR) 500 MG 24 hr tablet; TAKE 1 TABLET EVERY DAY WITH BREAKFAST  Dispense: 90 tablet; Refill: 1  4. Chronic midline low back pain with bilateral sciatica  5. Severe obesity (BMI >= 40) (HCC) Discussed diet and exercise for person with BMI >25 Will recheck weight in 3-6 months   6. Chronic dysfunction of both eustachian tubes - Ambulatory referral to ENT  7. Gastroesophageal reflux disease with esophagitis Avoid spicy foods Do not eat 2 hours prior to bedtime  - omeprazole (PRILOSEC) 40 MG capsule; TAKE 1 CAPSULE IN THE MORNING  Dispense: 90 capsule; Refill: 1  8. Neuropathy Do not go barefooted - gabapentin (NEURONTIN) 300 MG capsule; Take 1 capsule (300 mg total) by mouth 3 (three) times daily.  Dispense: 270 capsule; Refill: 1  9. Acute pain of right shoulder ,osit heat  rest - predniSONE (DELTASONE) 20 MG tablet; Take 2 tablets (40 mg total) by mouth daily with breakfast for 5 days. 2 po daily  for 5 days  Dispense: 10 tablet; Refill: 0  10. Erectile dysfunction due to arterial insufficiency - sildenafil (REVATIO) 20 MG tablet; Take 1-2 tablets (20-40 mg total) by mouth daily as needed.  Dispense: 30 tablet; Refill: 1   Labs pending Health Maintenance reviewed Diet and  exercise encouraged  Follow up plan: 3 months   Mary-Margaret Hassell Done, FNP

## 2022-04-01 NOTE — Addendum Note (Signed)
Addended by: Chevis Pretty on: 04/01/2022 11:01 AM   Modules accepted: Level of Service

## 2022-04-02 LAB — CMP14+EGFR
ALT: 31 IU/L (ref 0–44)
AST: 32 IU/L (ref 0–40)
Albumin/Globulin Ratio: 1.8 (ref 1.2–2.2)
Albumin: 4.4 g/dL (ref 3.9–4.9)
Alkaline Phosphatase: 65 IU/L (ref 44–121)
BUN/Creatinine Ratio: 13 (ref 10–24)
BUN: 20 mg/dL (ref 8–27)
Bilirubin Total: 0.2 mg/dL (ref 0.0–1.2)
CO2: 21 mmol/L (ref 20–29)
Calcium: 8.8 mg/dL (ref 8.6–10.2)
Chloride: 100 mmol/L (ref 96–106)
Creatinine, Ser: 1.59 mg/dL — ABNORMAL HIGH (ref 0.76–1.27)
Globulin, Total: 2.5 g/dL (ref 1.5–4.5)
Glucose: 125 mg/dL — ABNORMAL HIGH (ref 70–99)
Potassium: 5.1 mmol/L (ref 3.5–5.2)
Sodium: 137 mmol/L (ref 134–144)
Total Protein: 6.9 g/dL (ref 6.0–8.5)
eGFR: 47 mL/min/{1.73_m2} — ABNORMAL LOW (ref >59)

## 2022-04-02 LAB — CBC WITH DIFFERENTIAL/PLATELET
Basophils Absolute: 0 10*3/uL (ref 0.0–0.2)
Basos: 1 %
EOS (ABSOLUTE): 0.1 10*3/uL (ref 0.0–0.4)
Eos: 2 %
Hematocrit: 40 % (ref 37.5–51.0)
Hemoglobin: 13.5 g/dL (ref 13.0–17.7)
Immature Grans (Abs): 0 10*3/uL (ref 0.0–0.1)
Immature Granulocytes: 0 %
Lymphocytes Absolute: 1.2 10*3/uL (ref 0.7–3.1)
Lymphs: 36 %
MCH: 32.1 pg (ref 26.6–33.0)
MCHC: 33.8 g/dL (ref 31.5–35.7)
MCV: 95 fL (ref 79–97)
Monocytes Absolute: 0.2 10*3/uL (ref 0.1–0.9)
Monocytes: 7 %
Neutrophils Absolute: 1.8 10*3/uL (ref 1.4–7.0)
Neutrophils: 54 %
Platelets: 166 10*3/uL (ref 150–450)
RBC: 4.21 x10E6/uL (ref 4.14–5.80)
RDW: 12.7 % (ref 11.6–15.4)
WBC: 3.3 10*3/uL — ABNORMAL LOW (ref 3.4–10.8)

## 2022-04-02 LAB — LIPID PANEL
Chol/HDL Ratio: 2.9 ratio (ref 0.0–5.0)
Cholesterol, Total: 129 mg/dL (ref 100–199)
HDL: 44 mg/dL (ref >39)
LDL Chol Calc (NIH): 50 mg/dL (ref 0–99)
Triglycerides: 220 mg/dL — ABNORMAL HIGH (ref 0–149)
VLDL Cholesterol Cal: 35 mg/dL (ref 5–40)

## 2022-04-02 NOTE — Addendum Note (Signed)
Addended by: Rolena Infante on: 04/02/2022 11:11 AM   Modules accepted: Orders

## 2022-04-03 NOTE — Progress Notes (Signed)
Order was faxed on 03/29/22 to [facility] at [phone]

## 2022-04-15 ENCOUNTER — Telehealth: Payer: Self-pay | Admitting: Nurse Practitioner

## 2022-04-15 NOTE — Telephone Encounter (Signed)
He is still having shoulder pain. I believe he thinks this can be a long term medication

## 2022-04-15 NOTE — Telephone Encounter (Signed)
What doe she need this for. We normally do not do refills on steroids

## 2022-04-15 NOTE — Telephone Encounter (Signed)
Patient called to check on status of ENT Referral. Says his referral needs to be sent to ENT in [employer]. Please call patient back with update on referral.

## 2022-04-15 NOTE — Telephone Encounter (Signed)
Needs to see ortho if no better

## 2022-04-15 NOTE — Telephone Encounter (Signed)
  Prescription Request  04/15/2022  What is the name of the medication or equipment? PREDNISONE  Have you contacted your pharmacy to request a refill? YES  Which pharmacy would you like this sent to? [facility] [place]  Pt says MMM recently prescribed him this medication for his shoulder pain and it has really helped him but he is out of medicine and wants refill.

## 2022-04-16 ENCOUNTER — Other Ambulatory Visit: Payer: Self-pay

## 2022-04-16 DIAGNOSIS — M25511 Pain in right shoulder: Secondary | ICD-10-CM

## 2022-04-16 NOTE — Telephone Encounter (Signed)
Patient agreed to see ortho. Referral placed

## 2022-04-17 DIAGNOSIS — Z20822 Contact with and (suspected) exposure to covid-19: Secondary | ICD-10-CM | POA: Diagnosis not present

## 2022-04-17 NOTE — Telephone Encounter (Signed)
Pt is aware of ENT contact information and voiced understanding.

## 2022-04-17 NOTE — Telephone Encounter (Signed)
Please let patient know about ENT referral

## 2022-04-26 DIAGNOSIS — H5213 Myopia, bilateral: Secondary | ICD-10-CM | POA: Diagnosis not present

## 2022-04-26 DIAGNOSIS — H524 Presbyopia: Secondary | ICD-10-CM | POA: Diagnosis not present

## 2022-04-29 ENCOUNTER — Telehealth: Payer: Self-pay | Admitting: Nurse Practitioner

## 2022-04-29 NOTE — Telephone Encounter (Signed)
Patient calling to check on ENT referral. He said that it was discussed with his PCP on 2/19

## 2022-05-02 ENCOUNTER — Other Ambulatory Visit (INDEPENDENT_AMBULATORY_CARE_PROVIDER_SITE_OTHER): Payer: Medicare HMO

## 2022-05-02 ENCOUNTER — Ambulatory Visit (INDEPENDENT_AMBULATORY_CARE_PROVIDER_SITE_OTHER): Payer: Medicare HMO | Admitting: Orthopaedic Surgery

## 2022-05-02 ENCOUNTER — Encounter: Payer: Self-pay | Admitting: Orthopaedic Surgery

## 2022-05-02 VITALS — Ht 68.0 in | Wt 304.0 lb

## 2022-05-02 DIAGNOSIS — G8929 Other chronic pain: Secondary | ICD-10-CM

## 2022-05-02 DIAGNOSIS — M25511 Pain in right shoulder: Secondary | ICD-10-CM

## 2022-05-02 NOTE — Progress Notes (Signed)
Office Visit Note   Patient: Evan Smith           Date of Birth: 11/12/1955           MRN: [id number] Visit Date: 05/02/2022              Requested by: Chevis Pretty, [address] [place],  [place] [address] PCP: Chevis Pretty, FNP   Assessment & Plan: Visit Diagnoses:  1. Chronic right shoulder pain     Plan: Patient is gotten good results with prednisone p.o.  We offered him injection subacromially.  If this was not effective next step would be an MRI.  Patient was fearful getting the needle injection he can return if he changes his mind or call after 6 to 8 weeks about MRI imaging if he is having persistent problems.  Follow-Up Instructions: No follow-ups on file.   Orders:  Orders Placed This Encounter  Procedures   XR Shoulder Right   No orders of the defined types were placed in this encounter.     Procedures: No procedures performed   Clinical Data: No additional findings.   Subjective: Chief Complaint  Patient presents with   Right Shoulder - Pain    HPI 67 year old male seen with pain in his right shoulder and stiffness.  States he is placed on some prednisone 20 mg recently and that made his shoulder feel a lot better.  He can get his arm up over his head but lacks about 1520 degrees both shoulders reaching full flexion or abduction.  He is taking gabapentin without relief.  He ambulates using a cane in his right hand.  Previous rotator cuff repair on his left shoulder left shoulder still limited and he can get his arm to the top of his head but has trouble reaching higher.  Additionally patient has diabetes, morbid obesity BMI 46 hypertension cervical stenosis post three-level cervical fusion done with 3 single level plates C3-C6.  Patient states she does not like needles.  Review of Systems all systems noncontributory to HPI.   Objective: Vital Signs: Ht 5\' 8"  (1.727 m)   Wt (!) 304 lb (137.9 kg)   BMI 46.22 kg/m    Physical Exam Constitutional:      Appearance: He is well-developed.  HENT:     Head: Normocephalic and atraumatic.     Right Ear: External ear normal.     Left Ear: External ear normal.  Eyes:     Pupils: Pupils are equal, round, and reactive to light.  Neck:     Thyroid: No thyromegaly.     Trachea: No tracheal deviation.  Cardiovascular:     Rate and Rhythm: Normal rate.  Pulmonary:     Effort: Pulmonary effort is normal.     Breath sounds: No wheezing.  Abdominal:     General: Bowel sounds are normal.     Palpations: Abdomen is soft.  Musculoskeletal:     Cervical back: Neck supple.  Skin:    General: Skin is warm and dry.     Capillary Refill: Capillary refill takes less than 2 seconds.  Neurological:     Mental Status: He is alert and oriented to person, place, and time.  Psychiatric:        Behavior: Behavior normal.        Thought Content: Thought content normal.        Judgment: Judgment normal.     Ortho Exam for percent cervical rotation.  Positive impingement  right shoulder.  Long head of the biceps minimally tender elbow reaches full extension.  Specialty Comments:  No specialty comments available.  Imaging: No results found.   PMFS History: Patient Active Problem List   Diagnosis Date Noted   Gastroesophageal reflux disease with esophagitis 04/01/2022   Erectile dysfunction due to arterial insufficiency 04/01/2022   Stenosis of cervical spine with myelopathy ([place]) 02/15/2020   Neck pain 01/19/2020   Myelopathy in diseases classified elsewhere ([place]) 12/20/2019   Essential hypertension 11/15/2019   Chronic midline low back pain with bilateral sciatica 11/15/2019   Cervical radiculopathy at C6 11/03/2019   Allergic rhinitis 11/24/2015   Severe obesity (BMI >= 40) ([place] [facility]) 11/24/2015   Neuropathy 03/24/2015   Diabetes ([place]) 03/09/2015   Hyperlipidemia 02/13/2012   Sleep apnea 02/13/2012   Past Medical History:  Diagnosis Date   Allergic  rhinitis    by referral on 11/02/19/   Arthritis    Cataract    Chronic left shoulder pain    From [facility] received 11/02/19/   DDD (degenerative disc disease)    Dyspnea    on exertion    GERD (gastroesophageal reflux disease)    Hyperlipidemia    Hypertension    Intestinal flu 2000   OSA on CPAP    MODERATE OSA PER STUDY 2010   Perianal fistula    PONV (postoperative nausea and vomiting)    and hear to wake   Severe obesity (BMI >= 40) (HCC)    Received from [facility] on 11/02/19/   Type 2 diabetes mellitus with diabetic polyneuropathy, without long-term current use of insulin (HCC)    pt. states he's borderline    Family History  Problem Relation Age of Onset   Brain cancer Mother        tumor   Diabetes Mother    Kidney Stones Father    Diabetes Sister    Heart disease Sister    GI problems Sister    Cancer Brother        prostate   Cancer Brother        lymphoma    Heart disease Brother    Blindness Son    Arthritis Son        trouble with legs   Heart disease Paternal Uncle    Arthritis Son        shoulder and back     Past Surgical History:  Procedure Laterality Date   ANTERIOR CERVICAL DECOMP/DISCECTOMY FUSION N/A 02/15/2020   Procedure: ANTERIOR CERVICAL DECOMPRESSON FUSION CERVICAL THREE-FOUR, CERVICAL FOUR-FIVE,CERVICAL FIVE-SIX.;  Surgeon: Consuella Lose, MD;  Location: [facility];  Service: Neurosurgery;  Laterality: N/A;  anterior   CERVICAL FUSION  1990's   EVALUATION UNDER ANESTHESIA WITH ANAL FISTULECTOMY N/A 08/03/2012   Procedure: EXAM UNDER ANESTHESIA WITH ANAL FISTULECTOMY ;  Surgeon: Leighton Ruff, MD;  Location: [facility];  Service: General;  Laterality: N/A;   INCISION AND DRAINAGE PERIRECTAL ABSCESS  02/13/2012   Procedure: IRRIGATION AND DEBRIDEMENT PERIRECTAL ABSCESS;  Surgeon: Earnstine Regal, MD;  Location: [facility];  Service: General;  Laterality: N/A;   LUMBAR FUSION  2010   has had 2 low back surgeries    RIGHT HYDROCELECTOMY   07-28-2001   SHOULDER ARTHROSCOPY WITH ROTATOR CUFF REPAIR Left 1990's   Social History   Occupational History   Occupation: Disabled    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

## 2022-05-03 ENCOUNTER — Telehealth: Payer: Self-pay | Admitting: Nurse Practitioner

## 2022-05-03 NOTE — Telephone Encounter (Signed)
Contacted [name] to schedule their annual wellness visit. Appointment made for 03/06/2023.  Thank you,  Colletta Maryland,  [facility] Direct Dial ??[phone]

## 2022-05-05 ENCOUNTER — Other Ambulatory Visit: Payer: Self-pay | Admitting: Nurse Practitioner

## 2022-05-05 DIAGNOSIS — E782 Mixed hyperlipidemia: Secondary | ICD-10-CM

## 2022-05-05 DIAGNOSIS — I10 Essential (primary) hypertension: Secondary | ICD-10-CM

## 2022-05-05 DIAGNOSIS — E1142 Type 2 diabetes mellitus with diabetic polyneuropathy: Secondary | ICD-10-CM

## 2022-05-05 DIAGNOSIS — K2100 Gastro-esophageal reflux disease with esophagitis, without bleeding: Secondary | ICD-10-CM

## 2022-05-05 DIAGNOSIS — M25511 Pain in right shoulder: Secondary | ICD-10-CM

## 2022-05-13 ENCOUNTER — Telehealth: Payer: Self-pay | Admitting: Nurse Practitioner

## 2022-05-13 NOTE — Telephone Encounter (Signed)
Please see what they want this is crazy

## 2022-05-13 NOTE — Telephone Encounter (Signed)
Evan Lewandowsky,  Do you have any [employer] forms in regards to this?

## 2022-05-13 NOTE — Telephone Encounter (Signed)
Patient calling back to let us know that this needs to be done ASAP because on the letter that he has if it is not done within the enrollment time period, he will be disqualified. He was unsure of how long the enrollment period was or when it started/ended, he said that he would try to find out and call back with this information.

## 2022-05-14 NOTE — Telephone Encounter (Signed)
[facility] and verified that patient was a diabetic

## 2022-06-10 ENCOUNTER — Other Ambulatory Visit: Payer: Self-pay | Admitting: Nurse Practitioner

## 2022-06-10 DIAGNOSIS — N5201 Erectile dysfunction due to arterial insufficiency: Secondary | ICD-10-CM

## 2022-06-13 DIAGNOSIS — H6523 Chronic serous otitis media, bilateral: Secondary | ICD-10-CM | POA: Diagnosis not present

## 2022-06-13 DIAGNOSIS — H6990 Unspecified Eustachian tube disorder, unspecified ear: Secondary | ICD-10-CM | POA: Diagnosis not present

## 2022-07-01 ENCOUNTER — Telehealth: Payer: Self-pay | Admitting: Nurse Practitioner

## 2022-07-01 ENCOUNTER — Ambulatory Visit (INDEPENDENT_AMBULATORY_CARE_PROVIDER_SITE_OTHER): Payer: Medicare HMO | Admitting: Nurse Practitioner

## 2022-07-01 ENCOUNTER — Encounter: Payer: Self-pay | Admitting: Nurse Practitioner

## 2022-07-01 VITALS — BP 133/75 | HR 77 | Temp 98.6°F | Resp 20 | Ht 68.0 in | Wt 304.0 lb

## 2022-07-01 DIAGNOSIS — E785 Hyperlipidemia, unspecified: Secondary | ICD-10-CM

## 2022-07-01 DIAGNOSIS — E1169 Type 2 diabetes mellitus with other specified complication: Secondary | ICD-10-CM

## 2022-07-01 DIAGNOSIS — G629 Polyneuropathy, unspecified: Secondary | ICD-10-CM

## 2022-07-01 DIAGNOSIS — K2100 Gastro-esophageal reflux disease with esophagitis, without bleeding: Secondary | ICD-10-CM

## 2022-07-01 DIAGNOSIS — Z125 Encounter for screening for malignant neoplasm of prostate: Secondary | ICD-10-CM | POA: Diagnosis not present

## 2022-07-01 DIAGNOSIS — R2681 Unsteadiness on feet: Secondary | ICD-10-CM

## 2022-07-01 DIAGNOSIS — G4733 Obstructive sleep apnea (adult) (pediatric): Secondary | ICD-10-CM

## 2022-07-01 DIAGNOSIS — Z6841 Body Mass Index (BMI) 40.0 and over, adult: Secondary | ICD-10-CM

## 2022-07-01 DIAGNOSIS — Z7984 Long term (current) use of oral hypoglycemic drugs: Secondary | ICD-10-CM

## 2022-07-01 DIAGNOSIS — E782 Mixed hyperlipidemia: Secondary | ICD-10-CM | POA: Diagnosis not present

## 2022-07-01 DIAGNOSIS — E1142 Type 2 diabetes mellitus with diabetic polyneuropathy: Secondary | ICD-10-CM

## 2022-07-01 DIAGNOSIS — I10 Essential (primary) hypertension: Secondary | ICD-10-CM

## 2022-07-01 LAB — BAYER DCA HB A1C WAIVED: HB A1C (BAYER DCA - WAIVED): 5.8 % — ABNORMAL HIGH (ref 4.8–5.6)

## 2022-07-01 MED ORDER — LANCET DEVICE MISC: 1.0000 | Freq: Three times a day (TID) | 0 refills | Status: AC

## 2022-07-01 MED ORDER — BLOOD GLUCOSE TEST VI STRP: 1.0000 | ORAL_STRIP | Freq: Three times a day (TID) | 0 refills | Status: DC

## 2022-07-01 MED ORDER — BLOOD GLUCOSE MONITORING SUPPL DEVI: 1.0000 | Freq: Three times a day (TID) | 0 refills | Status: AC

## 2022-07-01 MED ORDER — LANCETS MISC. MISC: 1.0000 | Freq: Three times a day (TID) | 0 refills | Status: AC

## 2022-07-01 NOTE — Progress Notes (Signed)
Subjective:    Patient ID: Evan Smith, male    DOB: 1955/06/18, 67 y.o.   MRN: [id number]   Chief Complaint: Medical Management of Chronic Issues    HPI:  Evan Smith is a 67 y.o. who identifies as a male who was assigned male at birth.   Social history: Lives with: alone Work history: disability   Comes in today for follow up of the following chronic medical issues:  1. Essential hypertension Does not check BP at home. Denies chest pain, sob, headaches. BP Readings from Last 3 Encounters:  07/01/22 133/75  04/01/22 129/79  02/22/22 131/83     2. Hyperlipidemia associated with type 2 diabetes mellitus (HCC) Somewhat tries to watch diet; no dedicated exercise. Lab Results  Component Value Date   CHOL 129 04/01/2022   HDL 44 04/01/2022   LDLCALC 50 04/01/2022   TRIG 220 (H) 04/01/2022   CHOLHDL 2.9 04/01/2022     3. Type 2 diabetes mellitus with diabetic polyneuropathy, without long-term current use of insulin (HCC) Does not check blood sugars daily; somewhat tries to watch diet. No problems with low sugar that he is aware of Lab Results  Component Value Date   HGBA1C 6.4 (H) 04/01/2022     4. Screening for prostate cancer No issues.   5. Gastroesophageal reflux disease with esophagitis without hemorrhage Doing well on daily omeprazole.  6. Obstructive sleep apnea syndrome Uses CPAP at night and doing well.  7. Neuropathy Feels like things are crawling up his legs; some days are better than other.  8. Severe obesity (BMI >= 40) (HCC) No recent weight change.  Wt Readings from Last 3 Encounters:  07/01/22 (!) 304 lb (137.9 kg)  05/02/22 (!) 304 lb (137.9 kg)  04/01/22 (!) 304 lb (137.9 kg)   9. Gait instability Ordered hover round   New complaints: Patient having trouble with walking short distances due to his health. Needs hover round to use went going out of his house. 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  Allergies   Allergen Reactions   Hydrocodone     Other reaction(s): itch   Hydrocodeine [Dihydrocodeine] Nausea And Vomiting   Outpatient Encounter Medications as of 07/01/2022  Medication Sig   Alcohol Swabs (DROPSAFE ALCOHOL PREP) 70 % PADS Check BS QID Dx E11.9   amLODipine (NORVASC) 5 MG tablet TAKE 1 TABLET EVERY DAY   aspirin 81 MG tablet Take 1 tablet (81 mg total) by mouth daily.   Blood Glucose Calibration (TRUE METRIX LEVEL 1) Low SOLN USE AS DIRECTED   Blood Glucose Monitoring Suppl (TRUE METRIX AIR GLUCOSE METER) w/Device KIT 1 Device by Does not apply route daily. Dx E11.9   Blood Glucose Monitoring Suppl (TRUE METRIX AIR GLUCOSE METER) w/Device KIT USE UP TO FOUR TIMES DAILY AS DIRECTED.   cetirizine (ZYRTEC ALLERGY) 10 MG tablet Take 1 tablet (10 mg total) by mouth daily.   Cholecalciferol (VITAMIN D3) 1000 UNITS CAPS Take 1,000 Units by mouth daily.   fluticasone (FLONASE) 50 MCG/ACT nasal spray Place 2 sprays into both nostrils daily.   gabapentin (NEURONTIN) 300 MG capsule Take 1 capsule (300 mg total) by mouth 3 (three) times daily.   lisinopril-hydrochlorothiazide (ZESTORETIC) 20-25 MG tablet TAKE 1 TABLET EVERY DAY   loratadine (CLARITIN) 10 MG tablet TAKE 1 TABLET EVERY DAY   meloxicam (MOBIC) 15 MG tablet TAKE 1 TABLET EVERY DAY   metFORMIN (GLUCOPHAGE-XR) 500 MG 24 hr tablet TAKE 1 TABLET  EVERY DAY WITH BREAKFAST   omeprazole (PRILOSEC) 40 MG capsule TAKE 1 CAPSULE EVERY MORNING   potassium gluconate 595 (99 K) MG TABS tablet Take 99 mg by mouth daily.   sildenafil (REVATIO) 20 MG tablet TAKE 1-2 TABLETS BY MOUTH DAILY AS NEEDED   simvastatin (ZOCOR) 40 MG tablet TAKE 1 TABLET AT BEDTIME   sodium chloride (OCEAN) 0.65 % SOLN nasal spray Place 1 spray into both nostrils as needed for congestion.   TRUE METRIX BLOOD GLUCOSE TEST test strip TEST BLOOD SUGAR EVERY DAY   TRUEplus Lancets 33G MISC TEST BLOOD SUGAR EVERY DAY   No facility-administered encounter medications on file  as of 07/01/2022.    Past Surgical History:  Procedure Laterality Date   ANTERIOR CERVICAL DECOMP/DISCECTOMY FUSION N/A 02/15/2020   Procedure: ANTERIOR CERVICAL DECOMPRESSON FUSION CERVICAL THREE-FOUR, CERVICAL FOUR-FIVE,CERVICAL FIVE-SIX.;  Surgeon: Lisbeth Renshaw, MD;  Location: [facility] OR;  Service: Neurosurgery;  Laterality: N/A;  anterior   CERVICAL FUSION  1990's   EVALUATION UNDER ANESTHESIA WITH ANAL FISTULECTOMY N/A 08/03/2012   Procedure: EXAM UNDER ANESTHESIA WITH ANAL FISTULECTOMY ;  Surgeon: Romie Levee, MD;  Location: [facility];  Service: General;  Laterality: N/A;   INCISION AND DRAINAGE PERIRECTAL ABSCESS  02/13/2012   Procedure: IRRIGATION AND DEBRIDEMENT PERIRECTAL ABSCESS;  Surgeon: Velora Heckler, MD;  Location: [facility] OR;  Service: General;  Laterality: N/A;   LUMBAR FUSION  2010   has had 2 low back surgeries    RIGHT HYDROCELECTOMY  07-28-2001   SHOULDER ARTHROSCOPY WITH ROTATOR CUFF REPAIR Left 1990's    Family History  Problem Relation Age of Onset   Brain cancer Mother        tumor   Diabetes Mother    Kidney Stones Father    Diabetes Sister    Heart disease Sister    GI problems Sister    Cancer Brother        prostate   Cancer Brother        lymphoma    Heart disease Brother    Blindness Son    Arthritis Son        trouble with legs   Heart disease Paternal Uncle    Arthritis Son        shoulder and back       Controlled substance contract: n/a     Review of Systems  Constitutional:  Negative for appetite change and fatigue.  HENT:  Negative for ear pain.   Eyes:  Negative for pain and visual disturbance.  Respiratory:  Negative for chest tightness and shortness of breath.   Cardiovascular:  Positive for leg swelling. Negative for chest pain and palpitations.  Gastrointestinal:  Negative for abdominal pain.  Endocrine: Negative for cold intolerance and heat intolerance.  Genitourinary:  Negative for difficulty urinating.   Musculoskeletal:  Positive for back pain.       Chronic back pain; uses cane  Skin:  Negative for color change.  Neurological:  Negative for dizziness and headaches.       Neuropathy makes it feel like something is crawling up and down his legs  Hematological:  Negative for adenopathy.  Psychiatric/Behavioral:  Negative for confusion and sleep disturbance.        Objective:   Physical Exam Vitals and nursing note reviewed.  Constitutional:      General: He is not in acute distress.    Appearance: Normal appearance. He is well-developed and well-groomed. He is obese. He is not  ill-appearing.  HENT:     Head: Normocephalic and atraumatic.     Right Ear: Tympanic membrane normal.     Left Ear: Tympanic membrane normal.     Nose: Nose normal.     Mouth/Throat:     Mouth: Mucous membranes are moist.     Pharynx: Oropharynx is clear.  Eyes:     Conjunctiva/sclera: Conjunctivae normal.     Pupils: Pupils are equal, round, and reactive to light.  Neck:     Thyroid: No thyroid mass, thyromegaly or thyroid tenderness.     Vascular: No carotid bruit or JVD.  Cardiovascular:     Rate and Rhythm: Normal rate and regular rhythm.     Pulses: Normal pulses.     Heart sounds: Normal heart sounds.  Pulmonary:     Effort: Pulmonary effort is normal. No respiratory distress.     Breath sounds: Normal breath sounds. No wheezing, rhonchi or rales.  Abdominal:     General: Bowel sounds are normal. There is no distension.     Palpations: Abdomen is soft. There is no hepatomegaly, splenomegaly or mass.     Tenderness: There is no abdominal tenderness. There is no guarding.  Musculoskeletal:        General: Normal range of motion.     Cervical back: Normal range of motion and neck supple. No tenderness.     Right lower leg: 2+ Pitting Edema present.     Left lower leg: 2+ Pitting Edema present.  Lymphadenopathy:     Cervical: No cervical adenopathy.  Skin:    General: Skin is warm and dry.      Capillary Refill: Capillary refill takes less than 2 seconds.  Neurological:     General: No focal deficit present.     Mental Status: He is alert and oriented to person, place, and time.     Comments: Ambulates with cane  Psychiatric:        Mood and Affect: Mood normal.        Behavior: Behavior normal.   BP 133/75   Pulse 77   Temp 98.6 F (37 C) (Temporal)   Resp 20   Ht 5\' 8"  (1.727 m)   Wt (!) 304 lb (137.9 kg)   SpO2 96%   BMI 46.22 kg/m  A1C today 5.8        Assessment & Plan:   MANRAJ LEMMEN comes in today with chief complaint of Medical Management of Chronic Issues   Diagnosis and orders addressed:  1. Essential hypertension Low sodium diet - CBC with Differential/Platelet - CMP14+EGFR  2. Hyperlipidemia associated with type 2 diabetes mellitus (HCC) Lab Results  Component Value Date   CHOL 129 04/01/2022   HDL 44 04/01/2022   LDLCALC 50 04/01/2022   TRIG 220 (H) 04/01/2022   CHOLHDL 2.9 04/01/2022    - Lipid panel  3. Type 2 diabetes mellitus with diabetic polyneuropathy, without long-term current use of insulin (HCC) Continue  to watch carbs in diet - Bayer DCA Hb A1c Waived - Microalbumin / creatinine urine ratio  4. Screening for prostate cancer Labs pending - PSA, total and free  5. Gastroesophageal reflux disease with esophagitis without hemorrhage Avoid spicy foods Do not eat 2 hours prior to bedtime   6. Obstructive sleep apnea syndrome Wear cpap nightly  7. Neuropathy Continue neurontin Keep check of feet daily  8. Severe obesity (BMI >= 40) (HCC) Discussed diet and exercise for person with BMI >25 Will recheck  weight in 3-6 months    Labs pending Health Maintenance reviewed Diet and exercise encouraged  Follow up plan: 6 months   Mary-Margaret Daphine Deutscher, FNP

## 2022-07-01 NOTE — Telephone Encounter (Signed)
This is not usually what we do for hover around. We usually have to do separate visit and answer a puch of questions. Will you please check on this for me.

## 2022-07-01 NOTE — Telephone Encounter (Signed)
Correct, if today's visit was not for hover round will need OV for documentation and script then we will send order & notes to [facility]

## 2022-07-01 NOTE — Patient Instructions (Signed)
Fall Prevention in the Home, Adult Falls can cause injuries and can happen to people of all ages. There are many things you can do to make your home safer and to help prevent falls. What actions can I take to prevent falls? General information Use good lighting in all rooms. Make sure to: Replace any light bulbs that burn out. Turn on the lights in dark areas and use night-lights. Keep items that you use often in easy-to-reach places. Lower the shelves around your home if needed. Move furniture so that there are clear paths around it. Do not use throw rugs or other things on the floor that can make you trip. If any of your floors are uneven, fix them. Add color or contrast paint or tape to clearly mark and help you see: Grab bars or handrails. First and last steps of staircases. Where the edge of each step is. If you use a ladder or stepladder: Make sure that it is fully opened. Do not climb a closed ladder. Make sure the sides of the ladder are locked in place. Have someone hold the ladder while you use it. Know where your pets are as you move through your home. What can I do in the bathroom?     Keep the floor dry. Clean up any water on the floor right away. Remove soap buildup in the bathtub or shower. Buildup makes bathtubs and showers slippery. Use non-skid mats or decals on the floor of the bathtub or shower. Attach bath mats securely with double-sided, non-slip rug tape. If you need to sit down in the shower, use a non-slip stool. Install grab bars by the toilet and in the bathtub and shower. Do not use towel bars as grab bars. What can I do in the bedroom? Make sure that you have a light by your bed that is easy to reach. Do not use any sheets or blankets on your bed that hang to the floor. Have a firm chair or bench with side arms that you can use for support when you get dressed. What can I do in the kitchen? Clean up any spills right away. If you need to reach something  above you, use a step stool with a grab bar. Keep electrical cords out of the way. Do not use floor polish or wax that makes floors slippery. What can I do with my stairs? Do not leave anything on the stairs. Make sure that you have a light switch at the top and the bottom of the stairs. Make sure that there are handrails on both sides of the stairs. Fix handrails that are broken or loose. Install non-slip stair treads on all your stairs if they do not have carpet. Avoid having throw rugs at the top or bottom of the stairs. Choose a carpet that does not hide the edge of the steps on the stairs. Make sure that the carpet is firmly attached to the stairs. Fix carpet that is loose or worn. What can I do on the outside of my home? Use bright outdoor lighting. Fix the edges of walkways and driveways and fix any cracks. Clear paths of anything that can make you trip, such as tools or rocks. Add color or contrast paint or tape to clearly mark and help you see anything that might make you trip as you walk through a door, such as a raised step or threshold. Trim any bushes or trees on paths to your home. Check to see if handrails are loose   or broken and that both sides of all steps have handrails. Install guardrails along the edges of any raised decks and porches. Have leaves, snow, or ice cleared regularly. Use sand, salt, or ice melter on paths if you live where there is ice and snow during the winter. Clean up any spills in your garage right away. This includes grease or oil spills. What other actions can I take? Review your medicines with your doctor. Some medicines can cause dizziness or changes in blood pressure, which increase your risk of falling. Wear shoes that: Have a low heel. Do not wear high heels. Have rubber bottoms and are closed at the toe. Feel good on your feet and fit well. Use tools that help you move around if needed. These include: Canes. Walkers. Scooters. Crutches. Ask  your doctor what else you can do to help prevent falls. This may include seeing a physical therapist to learn to do exercises to move better and get stronger. Where to find more information Centers for Disease Control and Prevention, STEAD[employer]: cdc.gov National Institute on Aging: nia.nih.gov National Institute on Aging: nia.nih.gov Contact a doctor if: You are afraid of falling at home. You feel weak, drowsy, or dizzy at home. You fall at home. Get help right away if you: Lose consciousness or have trouble moving after a fall. Have a fall that causes a head injury. These symptoms may be an emergency. Get help right away. Call 911. Do not wait to see if the symptoms will go away. Do not drive yourself to the hospital. This information is not intended to replace advice given to you by your health care provider. Make sure you discuss any questions you have with your health care provider. Document Revised: 10/01/2021 Document Reviewed: 10/01/2021 Elsevier Patient Education  2023 Elsevier Inc.  

## 2022-07-02 LAB — PSA, TOTAL AND FREE
PSA, Free Pct: 34 %
PSA, Free: 0.17 ng/mL
Prostate Specific Ag, Serum: 0.5 ng/mL (ref 0.0–4.0)

## 2022-07-02 LAB — CBC WITH DIFFERENTIAL/PLATELET
Basophils Absolute: 0 10*3/uL (ref 0.0–0.2)
Basos: 1 %
EOS (ABSOLUTE): 0.1 10*3/uL (ref 0.0–0.4)
Eos: 3 %
Hematocrit: 38.1 % (ref 37.5–51.0)
Hemoglobin: 12.5 g/dL — ABNORMAL LOW (ref 13.0–17.7)
Immature Grans (Abs): 0 10*3/uL (ref 0.0–0.1)
Immature Granulocytes: 0 %
Lymphocytes Absolute: 1 10*3/uL (ref 0.7–3.1)
Lymphs: 34 %
MCH: 31.5 pg (ref 26.6–33.0)
MCHC: 32.8 g/dL (ref 31.5–35.7)
MCV: 96 fL (ref 79–97)
Monocytes Absolute: 0.3 10*3/uL (ref 0.1–0.9)
Monocytes: 11 %
Neutrophils Absolute: 1.5 10*3/uL (ref 1.4–7.0)
Neutrophils: 51 %
Platelets: 181 10*3/uL (ref 150–450)
RBC: 3.97 x10E6/uL — ABNORMAL LOW (ref 4.14–5.80)
RDW: 12.5 % (ref 11.6–15.4)
WBC: 2.9 10*3/uL — ABNORMAL LOW (ref 3.4–10.8)

## 2022-07-02 LAB — MICROALBUMIN / CREATININE URINE RATIO
Creatinine, Urine: 125.6 mg/dL
Microalb/Creat Ratio: 4 mg/g creat (ref 0–29)
Microalbumin, Urine: 5.1 ug/mL

## 2022-07-02 LAB — CMP14+EGFR
ALT: 27 IU/L (ref 0–44)
AST: 28 IU/L (ref 0–40)
Albumin/Globulin Ratio: 1.7 (ref 1.2–2.2)
Albumin: 4.3 g/dL (ref 3.9–4.9)
Alkaline Phosphatase: 66 IU/L (ref 44–121)
BUN/Creatinine Ratio: 13 (ref 10–24)
BUN: 17 mg/dL (ref 8–27)
Bilirubin Total: <0.2 mg/dL (ref 0.0–1.2)
CO2: 21 mmol/L (ref 20–29)
Calcium: 9.4 mg/dL (ref 8.6–10.2)
Chloride: 103 mmol/L (ref 96–106)
Creatinine, Ser: 1.33 mg/dL — ABNORMAL HIGH (ref 0.76–1.27)
Globulin, Total: 2.6 g/dL (ref 1.5–4.5)
Glucose: 114 mg/dL — ABNORMAL HIGH (ref 70–99)
Potassium: 5.1 mmol/L (ref 3.5–5.2)
Sodium: 139 mmol/L (ref 134–144)
Total Protein: 6.9 g/dL (ref 6.0–8.5)
eGFR: 58 mL/min/{1.73_m2} — ABNORMAL LOW (ref >59)

## 2022-07-02 LAB — LIPID PANEL
Chol/HDL Ratio: 2.7 ratio (ref 0.0–5.0)
Cholesterol, Total: 120 mg/dL (ref 100–199)
HDL: 45 mg/dL (ref >39)
LDL Chol Calc (NIH): 51 mg/dL (ref 0–99)
Triglycerides: 141 mg/dL (ref 0–149)
VLDL Cholesterol Cal: 24 mg/dL (ref 5–40)

## 2022-07-02 NOTE — Addendum Note (Signed)
Addended by: Bennie Pierini on: 07/02/2022 03:31 PM   Modules accepted: Orders

## 2022-07-03 NOTE — Progress Notes (Signed)
Hover round order faxed to [facility] at fax # [phone]

## 2022-07-16 NOTE — Telephone Encounter (Signed)
Pt called in today to check up on this message. Pt aware that MMM will be back in on Thursday and will address the message. Will nurse pt to let him know if he needs to schedule another ov or will MMM be able to write rx?

## 2022-07-18 ENCOUNTER — Other Ambulatory Visit: Payer: Self-pay | Admitting: Nurse Practitioner

## 2022-07-18 DIAGNOSIS — I10 Essential (primary) hypertension: Secondary | ICD-10-CM

## 2022-07-18 DIAGNOSIS — E782 Mixed hyperlipidemia: Secondary | ICD-10-CM

## 2022-07-18 DIAGNOSIS — M25511 Pain in right shoulder: Secondary | ICD-10-CM

## 2022-07-18 DIAGNOSIS — E1142 Type 2 diabetes mellitus with diabetic polyneuropathy: Secondary | ICD-10-CM

## 2022-07-18 DIAGNOSIS — K2100 Gastro-esophageal reflux disease with esophagitis, without bleeding: Secondary | ICD-10-CM

## 2022-07-18 NOTE — Telephone Encounter (Signed)
Order and OV notes faxed to [facility] at [phone]

## 2022-07-22 ENCOUNTER — Other Ambulatory Visit: Payer: Self-pay | Admitting: Nurse Practitioner

## 2022-07-22 DIAGNOSIS — H6993 Unspecified Eustachian tube disorder, bilateral: Secondary | ICD-10-CM

## 2022-07-23 NOTE — Telephone Encounter (Signed)
Pt aware order faxed to new # to [facility].

## 2022-07-23 NOTE — Telephone Encounter (Signed)
Pt called to let us know that he called [facility] regarding his Hoveround and was told that they have not received the order or OV notes for it.   Order and notes should be faxed to Fax# [phone].  Was also told that we can call them directly.

## 2022-07-24 ENCOUNTER — Telehealth: Payer: Self-pay | Admitting: Nurse Practitioner

## 2022-07-24 DIAGNOSIS — G629 Polyneuropathy, unspecified: Secondary | ICD-10-CM

## 2022-07-24 NOTE — Telephone Encounter (Signed)
  Prescription Request  07/24/2022  Is this a "Controlled Substance" medicine?   Have you seen your PCP in the last 2 weeks? LOV 07/01/22 NOV 01/02/23  If YES, route message to pool  -  If NO, patient needs to be scheduled for appointment.  What is the name of the medication or equipment? gabapentin (NEURONTIN) 300 MG capsule [id number] (number given to pt, he was unsure of what it was for)   Have you contacted your pharmacy to request a refill? Yes    Which pharmacy would you like this sent to?  [facility] [place], [place] - [address]     Patient notified that their request is being sent to the clinical staff for review and that they should receive a response within 2 business days.

## 2022-07-25 MED ORDER — GABAPENTIN 300 MG PO CAPS
300.0000 mg | ORAL_CAPSULE | Freq: Three times a day (TID) | ORAL | 0 refills | Status: DC
Start: 2022-07-25 — End: 2022-12-04

## 2022-07-25 NOTE — Telephone Encounter (Signed)
Pt has not heard from [facility] about his Hoverround. Called [facility] they have not received order that I last faxed on 07/23/22 was given the direct fax # [phone] & direct phone # of [phone] to the Mobility Dept. Pt aware re-faxed order.

## 2022-07-26 NOTE — Telephone Encounter (Signed)
Informed pt that I sent order to [facility] at fax # [phone]

## 2022-07-26 NOTE — Telephone Encounter (Signed)
Pt says he just got off the phone with [facility] and they still have not received his order. He says at this point he just wants to go with a different company because they're not receiving his information no matter what fax # we use.   Please advise and call patient with update.

## 2022-08-27 ENCOUNTER — Ambulatory Visit (INDEPENDENT_AMBULATORY_CARE_PROVIDER_SITE_OTHER): Payer: Medicare HMO | Admitting: Nurse Practitioner

## 2022-08-27 ENCOUNTER — Encounter: Payer: Self-pay | Admitting: Nurse Practitioner

## 2022-08-27 VITALS — BP 119/74 | HR 78 | Temp 98.3°F | Resp 20 | Ht 68.0 in | Wt 303.0 lb

## 2022-08-27 DIAGNOSIS — R2681 Unsteadiness on feet: Secondary | ICD-10-CM

## 2022-08-27 DIAGNOSIS — R531 Weakness: Secondary | ICD-10-CM | POA: Diagnosis not present

## 2022-08-27 NOTE — Patient Instructions (Signed)
Fall Prevention in the Home, Adult Falls can cause injuries and can happen to people of all ages. There are many things you can do to make your home safer and to help prevent falls. What actions can I take to prevent falls? General information Use good lighting in all rooms. Make sure to: Replace any light bulbs that burn out. Turn on the lights in dark areas and use night-lights. Keep items that you use often in easy-to-reach places. Lower the shelves around your home if needed. Move furniture so that there are clear paths around it. Do not use throw rugs or other things on the floor that can make you trip. If any of your floors are uneven, fix them. Add color or contrast paint or tape to clearly mark and help you see: Grab bars or handrails. First and last steps of staircases. Where the edge of each step is. If you use a ladder or stepladder: Make sure that it is fully opened. Do not climb a closed ladder. Make sure the sides of the ladder are locked in place. Have someone hold the ladder while you use it. Know where your pets are as you move through your home. What can I do in the bathroom?     Keep the floor dry. Clean up any water on the floor right away. Remove soap buildup in the bathtub or shower. Buildup makes bathtubs and showers slippery. Use non-skid mats or decals on the floor of the bathtub or shower. Attach bath mats securely with double-sided, non-slip rug tape. If you need to sit down in the shower, use a non-slip stool. Install grab bars by the toilet and in the bathtub and shower. Do not use towel bars as grab bars. What can I do in the bedroom? Make sure that you have a light by your bed that is easy to reach. Do not use any sheets or blankets on your bed that hang to the floor. Have a firm chair or bench with side arms that you can use for support when you get dressed. What can I do in the kitchen? Clean up any spills right away. If you need to reach something  above you, use a step stool with a grab bar. Keep electrical cords out of the way. Do not use floor polish or wax that makes floors slippery. What can I do with my stairs? Do not leave anything on the stairs. Make sure that you have a light switch at the top and the bottom of the stairs. Make sure that there are handrails on both sides of the stairs. Fix handrails that are broken or loose. Install non-slip stair treads on all your stairs if they do not have carpet. Avoid having throw rugs at the top or bottom of the stairs. Choose a carpet that does not hide the edge of the steps on the stairs. Make sure that the carpet is firmly attached to the stairs. Fix carpet that is loose or worn. What can I do on the outside of my home? Use bright outdoor lighting. Fix the edges of walkways and driveways and fix any cracks. Clear paths of anything that can make you trip, such as tools or rocks. Add color or contrast paint or tape to clearly mark and help you see anything that might make you trip as you walk through a door, such as a raised step or threshold. Trim any bushes or trees on paths to your home. Check to see if handrails are loose   or broken and that both sides of all steps have handrails. Install guardrails along the edges of any raised decks and porches. Have leaves, snow, or ice cleared regularly. Use sand, salt, or ice melter on paths if you live where there is ice and snow during the winter. Clean up any spills in your garage right away. This includes grease or oil spills. What other actions can I take? Review your medicines with your doctor. Some medicines can cause dizziness or changes in blood pressure, which increase your risk of falling. Wear shoes that: Have a low heel. Do not wear high heels. Have rubber bottoms and are closed at the toe. Feel good on your feet and fit well. Use tools that help you move around if needed. These include: Canes. Walkers. Scooters. Crutches. Ask  your doctor what else you can do to help prevent falls. This may include seeing a physical therapist to learn to do exercises to move better and get stronger. Where to find more information Centers for Disease Control and Prevention, [employer]: cdc.gov National Institute on Aging: nia.nih.gov National Institute on Aging: nia.nih.gov Contact a doctor if: You are afraid of falling at home. You feel weak, drowsy, or dizzy at home. You fall at home. Get help right away if you: Lose consciousness or have trouble moving after a fall. Have a fall that causes a head injury. These symptoms may be an emergency. Get help right away. Call 911. Do not wait to see if the symptoms will go away. Do not drive yourself to the hospital. This information is not intended to replace advice given to you by your health care provider. Make sure you discuss any questions you have with your health care provider. Document Revised: 10/01/2021 Document Reviewed: 10/01/2021 Elsevier Patient Education  2024 Elsevier Inc.  

## 2022-08-27 NOTE — Progress Notes (Addendum)
Subjective:    Patient ID: Evan Smith, male    DOB: 10-May-1955, 67 y.o.   MRN: [id number]   Chief Complaint: Face to face for motorized scooter   HPI  Patient needs motorized scooter and is here today for face to face visit. Scooter information: - Height: 5\' 8"  (172.7 cm)  -Weight: (!) 303 lb (137.4 kg)  - strength- right uppr ext 4/5; left upper ext 4/5; right lower ext 4/5 and left lower ext 4/5. - he says that legs  do not feel like they can hold him up and walker and cane are no longer a help in home. Scooter will allow him  to get to and from restroom and around house safer. - not able to use manual wheel chair because upper body not able to push his body weight along. Has right shoulder pain rated 7-8/10 an limited motion in left shoulder.  - O2 sat 98 on room air- drops to 95% with activity - again limited ROM of left shoulder with abduction and extension - scooter will work best for him. The tiller type steering should not be an issue. - patient is mentally willing and able to operate scooter with no issues. He can operate a car so should have no trouble operating a scooter. - can only ambulate 30-40 feet without getting weak. - is able to use tiller steering - has adequate trunk support to seat on his own. -Able to get on and off scooter without assistance - a cane and walker do not support his weight well - he is able to do adl's but legs get weak easily and sometimes he has to stop and rest before completing tasks. - scooter will work best for him and allow him more freedom to take care of hisself and perfom ADL's more efficiently.  Patient Active Problem List   Diagnosis Date Noted   Gastroesophageal reflux disease with esophagitis 04/01/2022   Erectile dysfunction due to arterial insufficiency 04/01/2022   Stenosis of cervical spine with myelopathy (HCC) 02/15/2020   Neck pain 01/19/2020   Myelopathy in diseases classified elsewhere (HCC) 12/20/2019   Essential  hypertension 11/15/2019   Chronic midline low back pain with bilateral sciatica 11/15/2019   Cervical radiculopathy at C6 11/03/2019   Allergic rhinitis 11/24/2015   Severe obesity (BMI >= 40) (HCC) 11/24/2015   Neuropathy 03/24/2015   Diabetes (HCC) 03/09/2015   Hyperlipidemia 02/13/2012   Sleep apnea 02/13/2012       Review of Systems  Constitutional:  Negative for diaphoresis.  Eyes:  Negative for pain.  Respiratory:  Negative for shortness of breath.   Cardiovascular:  Negative for chest pain, palpitations and leg swelling.  Gastrointestinal:  Negative for abdominal pain.  Endocrine: Negative for polydipsia.  Musculoskeletal:  Positive for arthralgias (limited rom of bil shoulders).  Skin:  Negative for rash.  Neurological:  Negative for dizziness and headaches. Weakness: lower ext feel week. Hematological:  Does not bruise/bleed easily.  All other systems reviewed and are negative.      Objective:   Physical Exam Constitutional:      Appearance: Normal appearance. He is obese.  Cardiovascular:     Rate and Rhythm: Normal rate and regular rhythm.     Heart sounds: Normal heart sounds.  Pulmonary:     Effort: Pulmonary effort is normal.     Breath sounds: Normal breath sounds.  Skin:    General: Skin is warm.  Neurological:     General: No focal  deficit present.     Mental Status: He is alert and oriented to person, place, and time.  Psychiatric:        Mood and Affect: Mood normal.        Behavior: Behavior normal.    BP 119/74   Pulse 78   Temp 98.3 F (36.8 C) (Temporal)   Resp 20   Ht 5\' 8"  (1.727 m)   Wt (!) 303 lb (137.4 kg)   SpO2 95%   BMI 46.07 kg/m         Assessment & Plan:   Evan Smith in today with chief complaint of Face to face for motorized scooter   1. Weakness 2. Unsteady gait Paper work for scooter See scanned in documents    The above assessment and management plan was discussed with the patient. The patient  verbalized understanding of and has agreed to the management plan. Patient is aware to call the clinic if symptoms persist or worsen. Patient is aware when to return to the clinic for a follow-up visit. Patient educated on when it is appropriate to go to the emergency department.   Mary-Margaret Daphine Deutscher, FNP

## 2022-09-13 NOTE — Telephone Encounter (Signed)
Pt says that more ppw was faxed on 09/05/2022 for [employer]. Pt checking on the status. He is aware MMM out this week and will address next week. Pt wants a call back when done

## 2022-09-18 NOTE — Telephone Encounter (Signed)
Informed pt there was no ppw on PCP's desk, he informed me that they got in touch w/ him yesterday and they had everything they needed.

## 2022-09-26 DIAGNOSIS — M79676 Pain in unspecified toe(s): Secondary | ICD-10-CM | POA: Diagnosis not present

## 2022-09-26 DIAGNOSIS — B351 Tinea unguium: Secondary | ICD-10-CM | POA: Diagnosis not present

## 2022-10-23 ENCOUNTER — Other Ambulatory Visit: Payer: Self-pay | Admitting: Nurse Practitioner

## 2022-10-31 ENCOUNTER — Ambulatory Visit (INDEPENDENT_AMBULATORY_CARE_PROVIDER_SITE_OTHER): Payer: Medicare HMO

## 2022-10-31 DIAGNOSIS — E1142 Type 2 diabetes mellitus with diabetic polyneuropathy: Secondary | ICD-10-CM | POA: Diagnosis not present

## 2022-10-31 LAB — HM DIABETES EYE EXAM

## 2022-10-31 NOTE — Progress Notes (Signed)
Evan Smith arrived 10/31/2022 and has given verbal consent to obtain images and complete their overdue diabetic retinal screening.  The images have been sent to an ophthalmologist or optometrist for review and interpretation.  Results will be sent back to Bennie Pierini, FNP for review.  Patient has been informed they will be contacted when we receive the results via telephone or MyChart

## 2022-11-06 NOTE — Progress Notes (Signed)
Retinal screening was unreadable.

## 2022-12-04 ENCOUNTER — Other Ambulatory Visit: Payer: Self-pay | Admitting: Nurse Practitioner

## 2022-12-04 DIAGNOSIS — E1142 Type 2 diabetes mellitus with diabetic polyneuropathy: Secondary | ICD-10-CM

## 2022-12-04 DIAGNOSIS — K2100 Gastro-esophageal reflux disease with esophagitis, without bleeding: Secondary | ICD-10-CM

## 2022-12-04 DIAGNOSIS — M25511 Pain in right shoulder: Secondary | ICD-10-CM

## 2022-12-04 DIAGNOSIS — G629 Polyneuropathy, unspecified: Secondary | ICD-10-CM

## 2022-12-04 DIAGNOSIS — I10 Essential (primary) hypertension: Secondary | ICD-10-CM

## 2022-12-04 DIAGNOSIS — E782 Mixed hyperlipidemia: Secondary | ICD-10-CM

## 2023-01-02 ENCOUNTER — Encounter: Payer: Self-pay | Admitting: Nurse Practitioner

## 2023-01-02 ENCOUNTER — Ambulatory Visit: Payer: Medicare HMO | Admitting: Nurse Practitioner

## 2023-01-02 VITALS — BP 123/74 | HR 73 | Temp 98.3°F | Resp 20 | Ht 68.0 in | Wt 297.0 lb

## 2023-01-02 DIAGNOSIS — L84 Corns and callosities: Secondary | ICD-10-CM | POA: Diagnosis not present

## 2023-01-02 DIAGNOSIS — G8929 Other chronic pain: Secondary | ICD-10-CM | POA: Diagnosis not present

## 2023-01-02 DIAGNOSIS — E1151 Type 2 diabetes mellitus with diabetic peripheral angiopathy without gangrene: Secondary | ICD-10-CM | POA: Diagnosis not present

## 2023-01-02 DIAGNOSIS — Z23 Encounter for immunization: Secondary | ICD-10-CM | POA: Diagnosis not present

## 2023-01-02 DIAGNOSIS — E1169 Type 2 diabetes mellitus with other specified complication: Secondary | ICD-10-CM | POA: Diagnosis not present

## 2023-01-02 DIAGNOSIS — E1142 Type 2 diabetes mellitus with diabetic polyneuropathy: Secondary | ICD-10-CM

## 2023-01-02 DIAGNOSIS — G4733 Obstructive sleep apnea (adult) (pediatric): Secondary | ICD-10-CM | POA: Diagnosis not present

## 2023-01-02 DIAGNOSIS — E785 Hyperlipidemia, unspecified: Secondary | ICD-10-CM

## 2023-01-02 DIAGNOSIS — B351 Tinea unguium: Secondary | ICD-10-CM | POA: Diagnosis not present

## 2023-01-02 DIAGNOSIS — M545 Low back pain, unspecified: Secondary | ICD-10-CM | POA: Diagnosis not present

## 2023-01-02 DIAGNOSIS — I10 Essential (primary) hypertension: Secondary | ICD-10-CM

## 2023-01-02 DIAGNOSIS — Z7984 Long term (current) use of oral hypoglycemic drugs: Secondary | ICD-10-CM

## 2023-01-02 DIAGNOSIS — G629 Polyneuropathy, unspecified: Secondary | ICD-10-CM

## 2023-01-02 DIAGNOSIS — E782 Mixed hyperlipidemia: Secondary | ICD-10-CM

## 2023-01-02 DIAGNOSIS — K2100 Gastro-esophageal reflux disease with esophagitis, without bleeding: Secondary | ICD-10-CM

## 2023-01-02 DIAGNOSIS — M79676 Pain in unspecified toe(s): Secondary | ICD-10-CM | POA: Diagnosis not present

## 2023-01-02 LAB — BAYER DCA HB A1C WAIVED: HB A1C (BAYER DCA - WAIVED): 5.9 % — ABNORMAL HIGH (ref 4.8–5.6)

## 2023-01-02 LAB — LIPID PANEL
Chol/HDL Ratio: 2.9 ratio (ref 0.0–5.0)
Cholesterol, Total: 140 mg/dL (ref 100–199)
HDL: 49 mg/dL (ref >39)
LDL Chol Calc (NIH): 69 mg/dL (ref 0–99)
Triglycerides: 126 mg/dL (ref 0–149)
VLDL Cholesterol Cal: 22 mg/dL (ref 5–40)

## 2023-01-02 LAB — CMP14+EGFR
ALT: 21 [IU]/L (ref 0–44)
AST: 23 [IU]/L (ref 0–40)
Albumin: 4.2 g/dL (ref 3.9–4.9)
Alkaline Phosphatase: 69 [IU]/L (ref 44–121)
BUN/Creatinine Ratio: 14 (ref 10–24)
BUN: 19 mg/dL (ref 8–27)
Bilirubin Total: 0.4 mg/dL (ref 0.0–1.2)
CO2: 18 mmol/L — ABNORMAL LOW (ref 20–29)
Calcium: 9.4 mg/dL (ref 8.6–10.2)
Chloride: 103 mmol/L (ref 96–106)
Creatinine, Ser: 1.39 mg/dL — ABNORMAL HIGH (ref 0.76–1.27)
Globulin, Total: 2.5 g/dL (ref 1.5–4.5)
Glucose: 103 mg/dL — ABNORMAL HIGH (ref 70–99)
Potassium: 4.5 mmol/L (ref 3.5–5.2)
Sodium: 138 mmol/L (ref 134–144)
Total Protein: 6.7 g/dL (ref 6.0–8.5)
eGFR: 55 mL/min/{1.73_m2} — ABNORMAL LOW (ref >59)

## 2023-01-02 LAB — CBC WITH DIFFERENTIAL/PLATELET
Basophils Absolute: 0 10*3/uL (ref 0.0–0.2)
Basos: 0 %
EOS (ABSOLUTE): 0.1 10*3/uL (ref 0.0–0.4)
Eos: 3 %
Hematocrit: 37.5 % (ref 37.5–51.0)
Hemoglobin: 12.9 g/dL — ABNORMAL LOW (ref 13.0–17.7)
Immature Grans (Abs): 0 10*3/uL (ref 0.0–0.1)
Immature Granulocytes: 0 %
Lymphocytes Absolute: 0.9 10*3/uL (ref 0.7–3.1)
Lymphs: 26 %
MCH: 32.7 pg (ref 26.6–33.0)
MCHC: 34.4 g/dL (ref 31.5–35.7)
MCV: 95 fL (ref 79–97)
Monocytes Absolute: 0.4 10*3/uL (ref 0.1–0.9)
Monocytes: 10 %
Neutrophils Absolute: 2.2 10*3/uL (ref 1.4–7.0)
Neutrophils: 61 %
Platelets: 141 10*3/uL — ABNORMAL LOW (ref 150–450)
RBC: 3.94 x10E6/uL — ABNORMAL LOW (ref 4.14–5.80)
RDW: 12.7 % (ref 11.6–15.4)
WBC: 3.6 10*3/uL (ref 3.4–10.8)

## 2023-01-02 MED ORDER — METFORMIN HCL ER 500 MG PO TB24: ORAL_TABLET | ORAL | 1 refills | Status: DC

## 2023-01-02 MED ORDER — METHYLPREDNISOLONE ACETATE 80 MG/ML IJ SUSP
80.0000 mg | Freq: Once | INTRAMUSCULAR | Status: AC
  Administered 2023-01-02: 80 mg via INTRAMUSCULAR

## 2023-01-02 MED ORDER — OMEPRAZOLE 40 MG PO CPDR: DELAYED_RELEASE_CAPSULE | ORAL | 1 refills | Status: DC

## 2023-01-02 MED ORDER — AMLODIPINE BESYLATE 5 MG PO TABS: 5.0000 mg | ORAL_TABLET | Freq: Every day | ORAL | 1 refills | Status: DC

## 2023-01-02 MED ORDER — KETOROLAC TROMETHAMINE 60 MG/2ML IM SOLN
60.0000 mg | Freq: Once | INTRAMUSCULAR | Status: AC
  Administered 2023-01-02: 60 mg via INTRAMUSCULAR

## 2023-01-02 MED ORDER — LISINOPRIL-HYDROCHLOROTHIAZIDE 20-25 MG PO TABS: 1.0000 | ORAL_TABLET | Freq: Every day | ORAL | 1 refills | Status: DC

## 2023-01-02 MED ORDER — PREDNISONE 20 MG PO TABS: ORAL_TABLET | ORAL | 0 refills | Status: DC

## 2023-01-02 MED ORDER — SIMVASTATIN 40 MG PO TABS: 40.0000 mg | ORAL_TABLET | Freq: Every day | ORAL | 1 refills | Status: DC

## 2023-01-02 MED ORDER — GABAPENTIN 300 MG PO CAPS: 300.0000 mg | ORAL_CAPSULE | Freq: Three times a day (TID) | ORAL | 1 refills | Status: DC

## 2023-01-02 NOTE — Progress Notes (Signed)
Subjective:    Patient ID: Evan Smith, male    DOB: 08/17/1955, 67 y.o.   MRN: [id number]   Chief Complaint: Medical Management of Chronic Issues    HPI:  Evan Smith is a 67 y.o. who identifies as a male who was assigned male at birth.   Social history: Lives with:lives by hisself Work history: disability   Comes in today for follow up of the following chronic medical issues:  1. Type 2 diabetes mellitus with diabetic polyneuropathy, without long-term current use of insulin (HCC) Doe snot watch diet very closely. His blood sugars are running around 120. He does not check blood sugars very often. Lab Results  Component Value Date   HGBA1C 5.8 (H) 07/01/2022     2. Essential hypertension No c/o chest pain, sob or headache. Does not check blood pressure at home BP Readings from Last 3 Encounters:  01/02/23 123/74  08/27/22 119/74  07/01/22 133/75     3. Hyperlipidemia associated with type 2 diabetes mellitus (HCC) Does not watch diet and does no dedicated exercise Lab Results  Component Value Date   CHOL 120 07/01/2022   HDL 45 07/01/2022   LDLCALC 51 07/01/2022   TRIG 141 07/01/2022   CHOLHDL 2.7 07/01/2022     4. Gastroesophageal reflux disease with esophagitis without hemorrhage Is on omperazole and is dong well.  5. Obstructive sleep apnea syndrome Wears cpap night;y  6. Severe obesity (BMI >= 40) (HCC) Wt Readings from Last 3 Encounters:  01/02/23 297 lb (134.7 kg)  08/27/22 (!) 303 lb (137.4 kg)  07/01/22 (!) 304 lb (137.9 kg)   BMI Readings from Last 3 Encounters:  01/02/23 45.16 kg/m  08/27/22 46.07 kg/m  07/01/22 46.22 kg/m      New complaints: Back pain is worsening. He has had for several years and is on mobic daily which is no longer helping. Rates pain 8/10 at times. Worse when standing. Walking long distances increase pain.  Allergies  Allergen Reactions   Hydrocodone     Other reaction(s): itch   Hydrocodeine  [Dihydrocodeine] Nausea And Vomiting   Outpatient Encounter Medications as of 01/02/2023  Medication Sig   Alcohol Swabs (DROPSAFE ALCOHOL PREP) 70 % PADS Check BS QID Dx E11.9   amLODipine (NORVASC) 5 MG tablet TAKE 1 TABLET EVERY DAY   aspirin 81 MG tablet Take 1 tablet (81 mg total) by mouth daily.   Blood Glucose Calibration (TRUE METRIX LEVEL 1) Low SOLN USE AS DIRECTED   Blood Glucose Monitoring Suppl (TRUE METRIX AIR GLUCOSE METER) w/Device KIT 1 Device by Does not apply route daily. Dx E11.9   Blood Glucose Monitoring Suppl (TRUE METRIX AIR GLUCOSE METER) w/Device KIT USE UP TO FOUR TIMES DAILY AS DIRECTED.   Blood Glucose Monitoring Suppl DEVI 1 each by Does not apply route in the morning, at noon, and at bedtime. May substitute to any manufacturer covered by patient's insurance.   cetirizine (ZYRTEC ALLERGY) 10 MG tablet Take 1 tablet (10 mg total) by mouth daily.   Cholecalciferol (VITAMIN D3) 1000 UNITS CAPS Take 1,000 Units by mouth daily.   fluticasone (FLONASE) 50 MCG/ACT nasal spray USE 2 SPRAYS IN EACH NOSTRIL EVERY DAY   gabapentin (NEURONTIN) 300 MG capsule TAKE 1 CAPSULE THREE TIMES DAILY   glucose blood (TRUE METRIX BLOOD GLUCOSE TEST) test strip Check BS QID Dx E11.9   lisinopril-hydrochlorothiazide (ZESTORETIC) 20-25 MG tablet TAKE 1 TABLET EVERY DAY   loratadine (CLARITIN) 10 MG tablet TAKE  1 TABLET EVERY DAY   meloxicam (MOBIC) 15 MG tablet TAKE 1 TABLET EVERY DAY   metFORMIN (GLUCOPHAGE-XR) 500 MG 24 hr tablet TAKE 1 TABLET EVERY DAY WITH BREAKFAST   omeprazole (PRILOSEC) 40 MG capsule TAKE 1 CAPSULE EVERY MORNING   potassium gluconate 595 (99 K) MG TABS tablet Take 99 mg by mouth daily.   sildenafil (REVATIO) 20 MG tablet TAKE 1-2 TABLETS BY MOUTH DAILY AS NEEDED   simvastatin (ZOCOR) 40 MG tablet TAKE 1 TABLET AT BEDTIME   sodium chloride (OCEAN) 0.65 % SOLN nasal spray Place 1 spray into both nostrils as needed for congestion.   TRUEplus Lancets 33G MISC Check BS  QID Dx E11.9   No facility-administered encounter medications on file as of 01/02/2023.    Past Surgical History:  Procedure Laterality Date   ANTERIOR CERVICAL DECOMP/DISCECTOMY FUSION N/A 02/15/2020   Procedure: ANTERIOR CERVICAL DECOMPRESSON FUSION CERVICAL THREE-FOUR, CERVICAL FOUR-FIVE,CERVICAL FIVE-SIX.;  Surgeon: Lisbeth Renshaw, MD;  Location: [facility] OR;  Service: Neurosurgery;  Laterality: N/A;  anterior   CERVICAL FUSION  1990's   EVALUATION UNDER ANESTHESIA WITH ANAL FISTULECTOMY N/A 08/03/2012   Procedure: EXAM UNDER ANESTHESIA WITH ANAL FISTULECTOMY ;  Surgeon: Romie Levee, MD;  Location: [facility];  Service: General;  Laterality: N/A;   INCISION AND DRAINAGE PERIRECTAL ABSCESS  02/13/2012   Procedure: IRRIGATION AND DEBRIDEMENT PERIRECTAL ABSCESS;  Surgeon: Velora Heckler, MD;  Location: [facility] OR;  Service: General;  Laterality: N/A;   LUMBAR FUSION  2010   has had 2 low back surgeries    RIGHT HYDROCELECTOMY  07-28-2001   SHOULDER ARTHROSCOPY WITH ROTATOR CUFF REPAIR Left 1990's    Family History  Problem Relation Age of Onset   Brain cancer Mother        tumor   Diabetes Mother    Kidney Stones Father    Diabetes Sister    Heart disease Sister    GI problems Sister    Cancer Brother        prostate   Cancer Brother        lymphoma    Heart disease Brother    Blindness Son    Arthritis Son        trouble with legs   Heart disease Paternal Uncle    Arthritis Son        shoulder and back       Controlled substance contract: n/a     Review of Systems  Constitutional:  Negative for diaphoresis.  Eyes:  Negative for pain.  Respiratory:  Negative for shortness of breath.   Cardiovascular:  Negative for chest pain, palpitations and leg swelling.  Gastrointestinal:  Negative for abdominal pain.  Endocrine: Negative for polydipsia.  Skin:  Negative for rash.  Neurological:  Negative for dizziness, weakness and headaches.  Hematological:  Does  not bruise/bleed easily.  All other systems reviewed and are negative.      Objective:   Physical Exam Vitals and nursing note reviewed.  Constitutional:      Appearance: Normal appearance. He is well-developed.  HENT:     Head: Normocephalic.     Nose: Nose normal.     Mouth/Throat:     Mouth: Mucous membranes are moist.     Pharynx: Oropharynx is clear.  Eyes:     Pupils: Pupils are equal, round, and reactive to light.  Neck:     Thyroid: No thyroid mass or thyromegaly.     Vascular: No carotid bruit or  JVD.     Trachea: Phonation normal.  Cardiovascular:     Rate and Rhythm: Normal rate and regular rhythm.  Pulmonary:     Effort: Pulmonary effort is normal. No respiratory distress.     Breath sounds: Normal breath sounds.  Abdominal:     General: Bowel sounds are normal.     Palpations: Abdomen is soft.     Tenderness: There is no abdominal tenderness.  Musculoskeletal:        General: Normal range of motion.     Cervical back: Normal range of motion and neck supple.     Comments: Decrease ROM of lumbar spine with pain on flexion and extension (-) SLR Motor strength and sensation distally intact  Lymphadenopathy:     Cervical: No cervical adenopathy.  Skin:    General: Skin is warm and dry.  Neurological:     Mental Status: He is alert and oriented to person, place, and time.  Psychiatric:        Behavior: Behavior normal.        Thought Content: Thought content normal.        Judgment: Judgment normal.     BP 123/74   Pulse 73   Temp 98.3 F (36.8 C) (Temporal)   Resp 20   Ht 5\' 8"  (1.727 m)   Wt 297 lb (134.7 kg)   SpO2 99%   BMI 45.16 kg/m   Hgba1c 5.9%     Assessment & Plan:   Evan Smith comes in today with chief complaint of Medical Management of Chronic Issues   Diagnosis and orders addressed:  1. Type 2 diabetes mellitus with diabetic polyneuropathy, without long-term current use of insulin (HCC) Continue to watch carbs in  diet - Bayer DCA Hb A1c Waived - metFORMIN (GLUCOPHAGE-XR) 500 MG 24 hr tablet; TAKE 1 TABLET EVERY DAY WITH BREAKFAST  Dispense: 90 tablet; Refill: 1  2. Essential hypertension Low sodium diet - CBC with Differential/Platelet - CMP14+EGFR - amLODipine (NORVASC) 5 MG tablet; Take 1 tablet (5 mg total) by mouth daily.  Dispense: 90 tablet; Refill: 1 - lisinopril-hydrochlorothiazide (ZESTORETIC) 20-25 MG tablet; Take 1 tablet by mouth daily.  Dispense: 90 tablet; Refill: 1  3. Hyperlipidemia associated with type 2 diabetes mellitus (HCC) Labs pending - Lipid panel  4. Gastroesophageal reflux disease with esophagitis without hemorrhage Avoid spicy foods Do not eat 2 hours prior to bedtime   5. Obstructive sleep apnea syndrome Wear CPAP  6. Mixed hyperlipidemia - simvastatin (ZOCOR) 40 MG tablet; Take 1 tablet (40 mg total) by mouth at bedtime.  Dispense: 90 tablet; Refill: 1  7. Severe obesity (BMI >= 40) (HCC) Discussed diet and exercise for person with BMI >25 Will recheck weight in 3-6 months   8. Chronic midline low back pain without sciatica Moist heat  Rest RTO prn - methylPREDNISolone acetate (DEPO-MEDROL) injection 80 mg - ketorolac (TORADOL) injection 60 mg - predniSONE (DELTASONE) 20 MG tablet; 2 po at sametime daily for 5 days-  Dispense: 10 tablet; Refill: 0  9. Gastroesophageal reflux disease with esophagitis Avoid spicy foods Do not eat 2 hours prior to bedtime  - omeprazole (PRILOSEC) 40 MG capsule; TAKE 1 CAPSULE EVERY MORNING  Dispense: 90 capsule; Refill: 1  10. Neuropathy Do not go barefooted Keep appt with podiatry - gabapentin (NEURONTIN) 300 MG capsule; Take 1 capsule (300 mg total) by mouth 3 (three) times daily.  Dispense: 270 capsule; Refill: 1   Labs pending Health Maintenance reviewed Diet  and exercise encouraged  Follow up plan: 6 months   Mary-Margaret Daphine Deutscher, FNP

## 2023-01-02 NOTE — Patient Instructions (Signed)

## 2023-02-03 ENCOUNTER — Other Ambulatory Visit: Payer: Self-pay | Admitting: Nurse Practitioner

## 2023-02-03 DIAGNOSIS — N5201 Erectile dysfunction due to arterial insufficiency: Secondary | ICD-10-CM

## 2023-02-03 DIAGNOSIS — M545 Low back pain, unspecified: Secondary | ICD-10-CM

## 2023-02-16 ENCOUNTER — Other Ambulatory Visit: Payer: Self-pay | Admitting: Nurse Practitioner

## 2023-02-16 DIAGNOSIS — M25511 Pain in right shoulder: Secondary | ICD-10-CM

## 2023-03-06 ENCOUNTER — Ambulatory Visit (INDEPENDENT_AMBULATORY_CARE_PROVIDER_SITE_OTHER): Payer: Medicare HMO

## 2023-03-06 VITALS — Ht 68.0 in | Wt 297.0 lb

## 2023-03-06 DIAGNOSIS — Z1211 Encounter for screening for malignant neoplasm of colon: Secondary | ICD-10-CM | POA: Diagnosis not present

## 2023-03-06 DIAGNOSIS — Z0000 Encounter for general adult medical examination without abnormal findings: Secondary | ICD-10-CM | POA: Diagnosis not present

## 2023-03-06 NOTE — Patient Instructions (Signed)
Evan Smith , Thank you for taking time to come for your Medicare Wellness Visit. I appreciate your ongoing commitment to your health goals. Please review the following plan we discussed and let me know if I can assist you in the future.   Referrals/Orders/Follow-Ups/Clinician Recommendations: Aim for 30 minutes of exercise or brisk walking, 6-8 glasses of water, and 5 servings of fruits and vegetables each day.  This is a list of the screening recommended for you and due dates:  Health Maintenance  Topic Date Due   Colon Cancer Screening  07/03/2022   Cologuard (Stool DNA test)  07/06/2022   COVID-19 Vaccine (4 - 2024-25 season) 10/13/2022   Yearly kidney health urinalysis for diabetes  07/01/2023   Hemoglobin A1C  07/02/2023   Eye exam for diabetics  10/31/2023   Yearly kidney function blood test for diabetes  01/02/2024   Complete foot exam   01/02/2024   Medicare Annual Wellness Visit  03/05/2024   DTaP/Tdap/Td vaccine (2 - Td or Tdap) 04/01/2032   Pneumonia Vaccine  Completed   Flu Shot  Completed   Hepatitis C Screening  Completed   Zoster (Shingles) Vaccine  Completed   HPV Vaccine  Aged Out    Advanced directives: (ACP Link)Information on Advanced Care Planning can be found at [facility] of [place] Advance Health Care Directives Advance Health Care Directives ([email or web address]   Next Medicare Annual Wellness Visit scheduled for next year: Yes

## 2023-03-06 NOTE — Progress Notes (Signed)
Subjective:   Evan Smith is a 68 y.o. male who presents for Medicare Annual/Subsequent preventive examination.  Visit Complete: Virtual I connected with  Evan Smith on 03/06/23 by a audio enabled telemedicine application and verified that I am speaking with the correct person using two identifiers.  Patient Location: Home  Provider Location: Home Office  This patient declined Interactive audio and video telecommunications. Therefore the visit was completed with audio only.  I discussed the limitations of evaluation and management by telemedicine. The patient expressed understanding and agreed to proceed.  Vital Signs: Because this visit was a virtual/telehealth visit, some criteria may be missing or patient reported. Any vitals not documented were not able to be obtained and vitals that have been documented are patient reported.  Cardiac Risk Factors include: advanced age (>54men, >51 women);diabetes mellitus;dyslipidemia;male gender;hypertension     Objective:    Today's Vitals   03/06/23 0953  Weight: 297 lb (134.7 kg)  Height: 5\' 8"  (1.727 m)   Body mass index is 45.16 kg/m.     03/06/2023   10:27 AM 03/04/2022    8:45 AM 03/01/2021    8:26 AM 02/28/2020    8:33 AM 02/15/2020    7:04 AM 02/09/2020   11:23 AM 08/18/2018    2:39 PM  Advanced Directives  Does Patient Have a Medical Advance Directive? No No No No No No No  Would patient like information on creating a medical advance directive? Yes (MAU/Ambulatory/Procedural Areas - Information given) No - Patient declined No - Patient declined No - Patient declined No - Patient declined Yes (MAU/Ambulatory/Procedural Areas - Information given) Yes (MAU/Ambulatory/Procedural Areas - Information given)    Current Medications (verified) Outpatient Encounter Medications as of 03/06/2023  Medication Sig   Alcohol Swabs (DROPSAFE ALCOHOL PREP) 70 % PADS Check BS QID Dx E11.9   amLODipine (NORVASC) 5 MG tablet Take 1  tablet (5 mg total) by mouth daily.   aspirin 81 MG tablet Take 1 tablet (81 mg total) by mouth daily.   Blood Glucose Calibration (TRUE METRIX LEVEL 1) Low SOLN USE AS DIRECTED   Blood Glucose Monitoring Suppl (TRUE METRIX AIR GLUCOSE METER) w/Device KIT 1 Device by Does not apply route daily. Dx E11.9   Blood Glucose Monitoring Suppl (TRUE METRIX AIR GLUCOSE METER) w/Device KIT USE UP TO FOUR TIMES DAILY AS DIRECTED.   Blood Glucose Monitoring Suppl DEVI 1 each by Does not apply route in the morning, at noon, and at bedtime. May substitute to any manufacturer covered by patient's insurance.   cetirizine (ZYRTEC ALLERGY) 10 MG tablet Take 1 tablet (10 mg total) by mouth daily.   Cholecalciferol (VITAMIN D3) 1000 UNITS CAPS Take 1,000 Units by mouth daily.   fluticasone (FLONASE) 50 MCG/ACT nasal spray USE 2 SPRAYS IN EACH NOSTRIL EVERY DAY   gabapentin (NEURONTIN) 300 MG capsule Take 1 capsule (300 mg total) by mouth 3 (three) times daily.   glucose blood (TRUE METRIX BLOOD GLUCOSE TEST) test strip Check BS QID Dx E11.9   lisinopril-hydrochlorothiazide (ZESTORETIC) 20-25 MG tablet Take 1 tablet by mouth daily.   loratadine (CLARITIN) 10 MG tablet TAKE 1 TABLET EVERY DAY   meloxicam (MOBIC) 15 MG tablet TAKE 1 TABLET EVERY DAY   metFORMIN (GLUCOPHAGE-XR) 500 MG 24 hr tablet TAKE 1 TABLET EVERY DAY WITH BREAKFAST   omeprazole (PRILOSEC) 40 MG capsule TAKE 1 CAPSULE EVERY MORNING   potassium gluconate 595 (99 K) MG TABS tablet Take 99 mg by mouth daily.  sildenafil (REVATIO) 20 MG tablet TAKE 1-2 TABLETS BY MOUTH DAILY AS NEEDED   simvastatin (ZOCOR) 40 MG tablet Take 1 tablet (40 mg total) by mouth at bedtime.   sodium chloride (OCEAN) 0.65 % SOLN nasal spray Place 1 spray into both nostrils as needed for congestion.   TRUEplus Lancets 33G MISC Check BS QID Dx E11.9   predniSONE (DELTASONE) 20 MG tablet 2 po at sametime daily for 5 days- (Patient not taking: Reported on 03/06/2023)   No  facility-administered encounter medications on file as of 03/06/2023.    Allergies (verified) Hydrocodone and Hydrocodeine [dihydrocodeine]   History: Past Medical History:  Diagnosis Date   Allergic rhinitis    by referral on 11/02/19/   Arthritis    Cataract    Chronic left shoulder pain    From [facility] received 11/02/19/   DDD (degenerative disc disease)    Dyspnea    on exertion    GERD (gastroesophageal reflux disease)    Hyperlipidemia    Hypertension    Intestinal flu 2000   OSA on CPAP    MODERATE OSA PER STUDY 2010   Perianal fistula    PONV (postoperative nausea and vomiting)    and hear to wake   Severe obesity (BMI >= 40) (HCC)    Received from [facility] on 11/02/19/   Type 2 diabetes mellitus with diabetic polyneuropathy, without long-term current use of insulin (HCC)    pt. states he's borderline   Past Surgical History:  Procedure Laterality Date   ANTERIOR CERVICAL DECOMP/DISCECTOMY FUSION N/A 02/15/2020   Procedure: ANTERIOR CERVICAL DECOMPRESSON FUSION CERVICAL THREE-FOUR, CERVICAL FOUR-FIVE,CERVICAL FIVE-SIX.;  Surgeon: Lisbeth Renshaw, MD;  Location: [facility] OR;  Service: Neurosurgery;  Laterality: N/A;  anterior   CERVICAL FUSION  1990's   EVALUATION UNDER ANESTHESIA WITH ANAL FISTULECTOMY N/A 08/03/2012   Procedure: EXAM UNDER ANESTHESIA WITH ANAL FISTULECTOMY ;  Surgeon: Romie Levee, MD;  Location: [facility] [place];  Service: General;  Laterality: N/A;   INCISION AND DRAINAGE PERIRECTAL ABSCESS  02/13/2012   Procedure: IRRIGATION AND DEBRIDEMENT PERIRECTAL ABSCESS;  Surgeon: Velora Heckler, MD;  Location: [facility];  Service: General;  Laterality: N/A;   LUMBAR FUSION  2010   has had 2 low back surgeries    RIGHT HYDROCELECTOMY  07-28-2001   SHOULDER ARTHROSCOPY WITH ROTATOR CUFF REPAIR Left 1990's   Family History  Problem Relation Age of Onset   Brain cancer Mother        tumor   Diabetes Mother    Kidney Stones Father    Diabetes Sister     Heart disease Sister    GI problems Sister    Cancer Brother        prostate   Cancer Brother        lymphoma    Heart disease Brother    Blindness Son    Arthritis Son        trouble with legs   Heart disease Paternal Uncle    Arthritis Son        shoulder and back    Social History   Socioeconomic History   Marital status: Divorced    Spouse name: Not on file   Number of children: 2   Years of education: Not on file   Highest education level: 11th grade  Occupational History   Occupation: Disabled    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   Tobacco Use   Smoking status: Former    Current packs/day:  0.00    Average packs/day: 0.5 packs/day for 25.0 years (12.5 ttl pk-yrs)    Types: Cigarettes    Start date: 02/12/1979    Quit date: 02/12/2004    Years since quitting: 19.0   Smokeless tobacco: Never  Vaping Use   Vaping status: Never Used  Substance and Sexual Activity   Alcohol use: Yes    Comment: occassional   Drug use: No   Sexual activity: Yes  Other Topics Concern   Not on file  Social History Narrative   Lives by self   No caffeine, very seldom   Right handed   Lives alone on ground level senior apartment   Social Drivers of Health   Financial Resource Strain: Low Risk  (03/06/2023)   Overall Financial Resource Strain (CARDIA)    Difficulty of Paying Living Expenses: Not very hard  Food Insecurity: No Food Insecurity (03/06/2023)   Hunger Vital Sign    Worried About Running Out of Food in the Last Year: Never true    Ran Out of Food in the Last Year: Never true  Transportation Needs: No Transportation Needs (03/06/2023)   PRAPARE - Administrator, Civil Service (Medical): No    Lack of Transportation (Non-Medical): No  Physical Activity: Inactive (03/06/2023)   Exercise Vital Sign    Days of Exercise per Week: 0 days    Minutes of Exercise per Session: 0 min  Stress: No Stress Concern Present (03/06/2023)   [employer] of Occupational  Health - Occupational Stress Questionnaire    Feeling of Stress : Not at all  Social Connections: Moderately Isolated (03/06/2023)   Social Connection and Isolation Panel [NHANES]    Frequency of Communication with Friends and Family: More than three times a week    Frequency of Social Gatherings with Friends and Family: Three times a week    Attends Religious Services: More than 4 times per year    Active Member of Clubs or Organizations: No    Attends Banker Meetings: Never    Marital Status: Divorced    Tobacco Counseling Counseling given: Not Answered   Clinical Intake:  Pre-visit preparation completed: Yes  Pain : No/denies pain     Diabetes: Yes CBG done?: No Did pt. bring in CBG monitor from home?: No  How often do you need to have someone help you when you read instructions, pamphlets, or other written materials from your doctor or pharmacy?: 1 - Never  Interpreter Needed?: No  Information entered by :: Kandis Fantasia LPN   Activities of Daily Living    03/06/2023    9:54 AM  In your present state of health, do you have any difficulty performing the following activities:  Hearing? 0  Vision? 0  Difficulty concentrating or making decisions? 0  Walking or climbing stairs? 0  Dressing or bathing? 0  Doing errands, shopping? 0  Preparing Food and eating ? N  Using the Toilet? N  In the past six months, have you accidently leaked urine? N  Do you have problems with loss of bowel control? N  Managing your Medications? N  Managing your Finances? N  Housekeeping or managing your Housekeeping? N    Patient Care Team: Bennie Pierini, FNP as PCP - General (Family Medicine) Darnell Level, MD as Consulting Physician (General Surgery) West Bali, MD (Inactive) as Consulting Physician (Gastroenterology) Dalbert Mayotte, MD (Orthopedic Surgery) Shirlean Kelly, MD as Consulting Physician (Neurosurgery) Hilda Lias, MD as Attending  Physician (Neurosurgery)  Indicate any recent Medical Services you may have received from other than [facility] providers in the past year (date may be approximate).     Assessment:   This is a routine wellness examination for Cormac.  Hearing/Vision screen Hearing Screening - Comments:: Denies hearing difficulties   Vision Screening - Comments:: Wears rx glasses - up to date with routine eye exams with Dr. Conley Rolls     Goals Addressed             This Visit's Progress    COMPLETED: Patient Stated       02/28/2020 AWV Goal: Fall Prevention  Over the next year, patient will decrease their risk for falls by: Using assistive devices, such as a cane or walker, as needed Identifying fall risks within their home and correcting them by: Removing throw rugs Adding handrails to stairs or ramps Removing clutter and keeping a clear pathway throughout the home Increasing light, especially at night Adding shower handles/bars Raising toilet seat Identifying potential personal risk factors for falls: Medication side effects Incontinence/urgency Vestibular dysfunction Hearing loss Musculoskeletal disorders Neurological disorders Orthostatic hypotension        Depression Screen    03/06/2023    9:56 AM 01/02/2023    9:34 AM 08/27/2022   12:07 PM 07/01/2022    9:48 AM 04/01/2022    9:39 AM 03/04/2022    8:44 AM 02/22/2022   10:30 AM  PHQ 2/9 Scores  PHQ - 2 Score 0 0 0 0 0 0 0  PHQ- 9 Score  2 0 0 0 0 0    Fall Risk    03/06/2023   10:28 AM 01/02/2023    9:34 AM 08/27/2022   12:13 PM 08/27/2022   12:07 PM 07/01/2022    9:47 AM  Fall Risk   Falls in the past year? 0 1 0 0 0  Number falls in past yr: 0 0     Injury with Fall? 0 0     Risk for fall due to : No Fall Risks History of fall(s)     Follow up Falls prevention discussed;Education provided;Falls evaluation completed Education provided       MEDICARE RISK AT HOME: Medicare Risk at Home Any stairs in or around the home?:  No If so, are there any without handrails?: No Home free of loose throw rugs in walkways, pet beds, electrical cords, etc?: Yes Adequate lighting in your home to reduce risk of falls?: Yes Life alert?: No Use of a cane, walker or w/c?: No Grab bars in the bathroom?: Yes Shower chair or bench in shower?: No Elevated toilet seat or a handicapped toilet?: Yes  TIMED UP AND GO:  Was the test performed?  No    Cognitive Function:    03/27/2017    9:25 AM 03/22/2016    8:19 AM 03/21/2016    3:48 PM 03/09/2015    8:43 AM  MMSE - Mini Mental State Exam  Orientation to time 5  5 5   Orientation to Place 5  5 5   Registration 3  3 3   Attention/ Calculation 5  3 4   Recall 3  3 3   Language- name 2 objects 2  2 2   Language- repeat 1  1 1   Language- follow 3 step command 3  3 3   Language- read & follow direction 1  1 1   Write a sentence 1  1 1   Copy design 1 1 1 1   Total score 30  28  29        03/06/2023   10:28 AM 03/04/2022    8:45 AM 03/01/2021    8:29 AM 02/28/2020    8:35 AM 08/18/2018    2:41 PM  6CIT Screen  What Year? 0 points 0 points 0 points  0 points  What month? 0 points 0 points 0 points  0 points  What time? 0 points 0 points 0 points 0 points 0 points  Count back from 20 0 points 0 points 0 points 0 points 0 points  Months in reverse 0 points 0 points 0 points 0 points 0 points  Repeat phrase 0 points 0 points 0 points 2 points 0 points  Total Score 0 points 0 points 0 points  0 points    Immunizations Immunization History  Administered Date(s) Administered   Fluad Quad(high Dose 65+) 11/15/2019, 12/26/2020, 12/04/2021   Fluad Trivalent(High Dose 65+) 01/02/2023   Influenza,inj,Quad PF,6+ Mos 11/24/2013, 01/13/2015, 11/24/2015, 11/26/2016, 12/02/2017, 11/12/2018   Moderna Sars-Covid-2 Vaccination 03/28/2019, 04/25/2019, 12/30/2019   Pneumococcal Conjugate-13 12/02/2017   Pneumococcal Polysaccharide-23 08/19/2019   Tdap 04/01/2022   Zoster Recombinant(Shingrix)  03/29/2021, 09/28/2021    TDAP status: Up to date  Flu Vaccine status: Up to date  Pneumococcal vaccine status: Up to date  Covid-19 vaccine status: Information provided on how to obtain vaccines.   Qualifies for Shingles Vaccine? Yes   Zostavax completed No   Shingrix Completed?: Yes  Screening Tests Health Maintenance  Topic Date Due   Colonoscopy  07/03/2022   Fecal DNA (Cologuard)  07/06/2022   COVID-19 Vaccine (4 - 2024-25 season) 10/13/2022   Diabetic kidney evaluation - Urine ACR  07/01/2023   HEMOGLOBIN A1C  07/02/2023   OPHTHALMOLOGY EXAM  10/31/2023   Diabetic kidney evaluation - eGFR measurement  01/02/2024   FOOT EXAM  01/02/2024   Medicare Annual Wellness (AWV)  03/05/2024   DTaP/Tdap/Td (2 - Td or Tdap) 04/01/2032   Pneumonia Vaccine 23+ Years old  Completed   INFLUENZA VACCINE  Completed   Hepatitis C Screening  Completed   Zoster Vaccines- Shingrix  Completed   HPV VACCINES  Aged Out    Health Maintenance  Health Maintenance Due  Topic Date Due   Colonoscopy  07/03/2022   Fecal DNA (Cologuard)  07/06/2022   COVID-19 Vaccine (4 - 2024-25 season) 10/13/2022    Colorectal cancer screening:  Cologuard ordered for patient   Lung Cancer Screening: (Low Dose CT Chest recommended if Age 44-80 years, 20 pack-year currently smoking OR have quit w/in 15years.) does not qualify.   Lung Cancer Screening Referral: n/a  Additional Screening:  Hepatitis C Screening: does qualify; Completed 03/09/15  Vision Screening: Recommended annual ophthalmology exams for early detection of glaucoma and other disorders of the eye. Is the patient up to date with their annual eye exam?  Yes  Who is the provider or what is the name of the office in which the patient attends annual eye exams? Dr. Conley Rolls  If pt is not established with a provider, would they like to be referred to a provider to establish care? No .   Dental Screening: Recommended annual dental exams for proper oral  hygiene  Diabetic Foot Exam: Diabetic Foot Exam: Completed 01/02/23  Community Resource Referral / Chronic Care Management: CRR required this visit?  No   CCM required this visit?  No     Plan:     I have personally reviewed and noted the following in the patient's chart:  Medical and social history Use of alcohol, tobacco or illicit drugs  Current medications and supplements including opioid prescriptions. Patient is not currently taking opioid prescriptions. Functional ability and status Nutritional status Physical activity Advanced directives List of other physicians Hospitalizations, surgeries, and ER visits in previous 12 months Vitals Screenings to include cognitive, depression, and falls Referrals and appointments  In addition, I have reviewed and discussed with patient certain preventive protocols, quality metrics, and best practice recommendations. A written personalized care plan for preventive services as well as general preventive health recommendations were provided to patient.     Kandis Fantasia [place], [place]   1/61/0960   After Visit Summary: (MyChart) Due to this being a telephonic visit, the after visit summary with patients personalized plan was offered to patient via MyChart   Nurse Notes: No concerns at this time

## 2023-04-01 DIAGNOSIS — E1142 Type 2 diabetes mellitus with diabetic polyneuropathy: Secondary | ICD-10-CM | POA: Diagnosis not present

## 2023-04-01 DIAGNOSIS — L84 Corns and callosities: Secondary | ICD-10-CM | POA: Diagnosis not present

## 2023-04-01 DIAGNOSIS — B351 Tinea unguium: Secondary | ICD-10-CM | POA: Diagnosis not present

## 2023-04-01 DIAGNOSIS — M79676 Pain in unspecified toe(s): Secondary | ICD-10-CM | POA: Diagnosis not present

## 2023-05-06 DIAGNOSIS — Z1211 Encounter for screening for malignant neoplasm of colon: Secondary | ICD-10-CM | POA: Diagnosis not present

## 2023-05-09 LAB — COLOGUARD: COLOGUARD: NEGATIVE

## 2023-06-10 DIAGNOSIS — M79674 Pain in right toe(s): Secondary | ICD-10-CM | POA: Diagnosis not present

## 2023-06-10 DIAGNOSIS — B351 Tinea unguium: Secondary | ICD-10-CM | POA: Diagnosis not present

## 2023-06-10 DIAGNOSIS — M79675 Pain in left toe(s): Secondary | ICD-10-CM | POA: Diagnosis not present

## 2023-06-20 ENCOUNTER — Encounter (HOSPITAL_COMMUNITY): Payer: Self-pay

## 2023-06-27 ENCOUNTER — Ambulatory Visit: Payer: Self-pay | Admitting: Nurse Practitioner

## 2023-06-27 ENCOUNTER — Encounter: Payer: Self-pay | Admitting: Nurse Practitioner

## 2023-06-27 ENCOUNTER — Ambulatory Visit: Payer: Medicare HMO | Admitting: Nurse Practitioner

## 2023-06-27 ENCOUNTER — Ambulatory Visit (INDEPENDENT_AMBULATORY_CARE_PROVIDER_SITE_OTHER)

## 2023-06-27 VITALS — BP 128/74 | HR 81 | Temp 98.0°F | Ht 68.0 in | Wt 296.0 lb

## 2023-06-27 DIAGNOSIS — K2100 Gastro-esophageal reflux disease with esophagitis, without bleeding: Secondary | ICD-10-CM | POA: Diagnosis not present

## 2023-06-27 DIAGNOSIS — E782 Mixed hyperlipidemia: Secondary | ICD-10-CM

## 2023-06-27 DIAGNOSIS — Z6841 Body Mass Index (BMI) 40.0 and over, adult: Secondary | ICD-10-CM | POA: Diagnosis not present

## 2023-06-27 DIAGNOSIS — Z7984 Long term (current) use of oral hypoglycemic drugs: Secondary | ICD-10-CM | POA: Diagnosis not present

## 2023-06-27 DIAGNOSIS — Z0000 Encounter for general adult medical examination without abnormal findings: Secondary | ICD-10-CM

## 2023-06-27 DIAGNOSIS — G629 Polyneuropathy, unspecified: Secondary | ICD-10-CM | POA: Diagnosis not present

## 2023-06-27 DIAGNOSIS — I10 Essential (primary) hypertension: Secondary | ICD-10-CM | POA: Diagnosis not present

## 2023-06-27 DIAGNOSIS — E1142 Type 2 diabetes mellitus with diabetic polyneuropathy: Secondary | ICD-10-CM

## 2023-06-27 DIAGNOSIS — Z0001 Encounter for general adult medical examination with abnormal findings: Secondary | ICD-10-CM | POA: Diagnosis not present

## 2023-06-27 LAB — LIPID PANEL

## 2023-06-27 LAB — BAYER DCA HB A1C WAIVED: HB A1C (BAYER DCA - WAIVED): 5.7 % — ABNORMAL HIGH (ref 4.8–5.6)

## 2023-06-27 MED ORDER — SIMVASTATIN 40 MG PO TABS: 40.0000 mg | ORAL_TABLET | Freq: Every day | ORAL | 1 refills | Status: DC

## 2023-06-27 MED ORDER — METFORMIN HCL ER 500 MG PO TB24: ORAL_TABLET | ORAL | 1 refills | Status: DC

## 2023-06-27 MED ORDER — GABAPENTIN 400 MG PO CAPS: 400.0000 mg | ORAL_CAPSULE | Freq: Three times a day (TID) | ORAL | 1 refills | Status: DC

## 2023-06-27 MED ORDER — LISINOPRIL-HYDROCHLOROTHIAZIDE 20-25 MG PO TABS: 1.0000 | ORAL_TABLET | Freq: Every day | ORAL | 1 refills | Status: DC

## 2023-06-27 NOTE — Progress Notes (Signed)
 Subjective:    Patient ID: Evan Smith, male    DOB: May 30, 1955, 68 y.o.   MRN: [id number]   Chief Complaint: annual physical   HPI:  Evan Smith is a 68 y.o. who identifies as a male who was assigned male at birth.   Social history: Lives with: alone Work history: disability   Comes in today for follow up of the following chronic medical issues:  1. Essential hypertension Does not check BP at home. Denies chest pain, sob, headaches. BP Readings from Last 3 Encounters:  01/02/23 123/74  08/27/22 119/74  07/01/22 133/75     2. Hyperlipidemia associated with type 2 diabetes mellitus (HCC) Somewhat tries to watch diet; no dedicated exercise. Lab Results  Component Value Date   CHOL 140 01/02/2023   HDL 49 01/02/2023   LDLCALC 69 01/02/2023   TRIG 126 01/02/2023   CHOLHDL 2.9 01/02/2023     3. Type 2 diabetes mellitus with diabetic polyneuropathy, without long-term current use of insulin (HCC) Does not check blood sugars daily; somewhat tries to watch diet. No problems with low sugar that he is aware of Lab Results  Component Value Date   HGBA1C 5.9 (H) 01/02/2023     4. Screening for prostate cancer No issues.   5. Gastroesophageal reflux disease with esophagitis without hemorrhage Doing well on daily omeprazole .  6. Obstructive sleep apnea syndrome Uses CPAP at night and doing well.  7. Neuropathy Feels like things are crawling up his legs; some days are better than other.  8. Severe obesity (BMI >= 40) (HCC) No recent weight change.  Wt Readings from Last 3 Encounters:  06/27/23 296 lb (134.3 kg)  03/06/23 297 lb (134.7 kg)  01/02/23 297 lb (134.7 kg)    New complaints: Patient having trouble with walking short distances due to his health. Needs hover round to use went going out of his house. 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  Allergies  Allergen Reactions   Hydrocodone      Other reaction(s): itch   Hydrocodeine  [Dihydrocodeine] Nausea And Vomiting   Outpatient Encounter Medications as of 06/27/2023  Medication Sig   Alcohol Swabs (DROPSAFE ALCOHOL PREP) 70 % PADS Check BS QID Dx E11.9   amLODipine  (NORVASC ) 5 MG tablet Take 1 tablet (5 mg total) by mouth daily.   aspirin  81 MG tablet Take 1 tablet (81 mg total) by mouth daily.   Blood Glucose Calibration (TRUE METRIX LEVEL 1) Low SOLN USE AS DIRECTED   Blood Glucose Monitoring Suppl (TRUE METRIX AIR GLUCOSE METER) w/Device KIT 1 Device by Does not apply route daily. Dx E11.9   Blood Glucose Monitoring Suppl (TRUE METRIX AIR GLUCOSE METER) w/Device KIT USE UP TO FOUR TIMES DAILY AS DIRECTED.   Blood Glucose Monitoring Suppl DEVI 1 each by Does not apply route in the morning, at noon, and at bedtime. May substitute to any manufacturer covered by patient's insurance.   cetirizine  (ZYRTEC  ALLERGY) 10 MG tablet Take 1 tablet (10 mg total) by mouth daily.   Cholecalciferol (VITAMIN D3) 1000 UNITS CAPS Take 1,000 Units by mouth daily.   fluticasone  (FLONASE ) 50 MCG/ACT nasal spray USE 2 SPRAYS IN EACH NOSTRIL EVERY DAY   gabapentin  (NEURONTIN ) 300 MG capsule Take 1 capsule (300 mg total) by mouth 3 (three) times daily.   glucose blood (TRUE METRIX BLOOD GLUCOSE TEST) test strip Check BS QID Dx E11.9   lisinopril -hydrochlorothiazide  (ZESTORETIC ) 20-25 MG tablet Take 1 tablet by  mouth daily.   loratadine  (CLARITIN ) 10 MG tablet TAKE 1 TABLET EVERY DAY   meloxicam  (MOBIC ) 15 MG tablet TAKE 1 TABLET EVERY DAY   metFORMIN  (GLUCOPHAGE -XR) 500 MG 24 hr tablet TAKE 1 TABLET EVERY DAY WITH BREAKFAST   omeprazole  (PRILOSEC) 40 MG capsule TAKE 1 CAPSULE EVERY MORNING   potassium gluconate 595 (99 K) MG TABS tablet Take 99 mg by mouth daily.   predniSONE  (DELTASONE ) 20 MG tablet 2 po at sametime daily for 5 days- (Patient not taking: Reported on 03/06/2023)   sildenafil  (REVATIO ) 20 MG tablet TAKE 1-2 TABLETS BY MOUTH DAILY AS NEEDED   simvastatin  (ZOCOR ) 40 MG tablet  Take 1 tablet (40 mg total) by mouth at bedtime.   sodium chloride  (OCEAN) 0.65 % SOLN nasal spray Place 1 spray into both nostrils as needed for congestion.   TRUEplus Lancets 33G MISC Check BS QID Dx E11.9   No facility-administered encounter medications on file as of 06/27/2023.    Past Surgical History:  Procedure Laterality Date   ANTERIOR CERVICAL DECOMP/DISCECTOMY FUSION N/A 02/15/2020   Procedure: ANTERIOR CERVICAL DECOMPRESSON FUSION CERVICAL THREE-FOUR, CERVICAL FOUR-FIVE,CERVICAL FIVE-SIX.;  Surgeon: Augusto Blonder, MD;  Location: [facility] OR;  Service: Neurosurgery;  Laterality: N/A;  anterior   CERVICAL FUSION  1990's   EVALUATION UNDER ANESTHESIA WITH ANAL FISTULECTOMY N/A 08/03/2012   Procedure: EXAM UNDER ANESTHESIA WITH ANAL FISTULECTOMY ;  Surgeon: Joyce Nixon, MD;  Location: [facility];  Service: General;  Laterality: N/A;   INCISION AND DRAINAGE PERIRECTAL ABSCESS  02/13/2012   Procedure: IRRIGATION AND DEBRIDEMENT PERIRECTAL ABSCESS;  Surgeon: Keitha Pata, MD;  Location: [facility] OR;  Service: General;  Laterality: N/A;   LUMBAR FUSION  2010   has had 2 low back surgeries    RIGHT HYDROCELECTOMY  07-28-2001   SHOULDER ARTHROSCOPY WITH ROTATOR CUFF REPAIR Left 1990's    Family History  Problem Relation Age of Onset   Brain cancer Mother        tumor   Diabetes Mother    Kidney Stones Father    Diabetes Sister    Heart disease Sister    GI problems Sister    Cancer Brother        prostate   Cancer Brother        lymphoma    Heart disease Brother    Blindness Son    Arthritis Son        trouble with legs   Heart disease Paternal Uncle    Arthritis Son        shoulder and back       Controlled substance contract: n/a     Review of Systems  Constitutional:  Negative for appetite change and fatigue.  HENT:  Negative for ear pain.   Eyes:  Negative for pain and visual disturbance.  Respiratory:  Negative for chest tightness and shortness of  breath.   Cardiovascular:  Positive for leg swelling. Negative for chest pain and palpitations.  Gastrointestinal:  Negative for abdominal pain.  Endocrine: Negative for cold intolerance and heat intolerance.  Genitourinary:  Negative for difficulty urinating.  Musculoskeletal:  Positive for back pain.       Chronic back pain; uses cane  Skin:  Negative for color change.  Neurological:  Negative for dizziness and headaches.       Neuropathy makes it feel like something is crawling up and down his legs  Hematological:  Negative for adenopathy.  Psychiatric/Behavioral:  Negative for confusion and  sleep disturbance.        Objective:   Physical Exam Vitals and nursing note reviewed.  Constitutional:      General: He is not in acute distress.    Appearance: Normal appearance. He is well-developed and well-groomed. He is obese. He is not ill-appearing.  HENT:     Head: Normocephalic and atraumatic.     Right Ear: Tympanic membrane normal.     Left Ear: Tympanic membrane normal.     Nose: Nose normal.     Mouth/Throat:     Mouth: Mucous membranes are moist.     Pharynx: Oropharynx is clear.  Eyes:     Conjunctiva/sclera: Conjunctivae normal.     Pupils: Pupils are equal, round, and reactive to light.  Neck:     Thyroid : No thyroid  mass, thyromegaly or thyroid  tenderness.     Vascular: No carotid bruit or JVD.  Cardiovascular:     Rate and Rhythm: Normal rate and regular rhythm.     Pulses: Normal pulses.     Heart sounds: Normal heart sounds.  Pulmonary:     Effort: Pulmonary effort is normal. No respiratory distress.     Breath sounds: Normal breath sounds. No wheezing, rhonchi or rales.  Abdominal:     General: Bowel sounds are normal. There is no distension.     Palpations: Abdomen is soft. There is no hepatomegaly, splenomegaly or mass.     Tenderness: There is no abdominal tenderness. There is no guarding.     Hernia: A hernia (soft umbilical hernia) is present.   Musculoskeletal:        General: Normal range of motion.     Cervical back: Normal range of motion and neck supple. No tenderness.     Right lower leg: 2+ Pitting Edema present.     Left lower leg: 2+ Pitting Edema present.  Lymphadenopathy:     Cervical: No cervical adenopathy.  Skin:    General: Skin is warm and dry.     Capillary Refill: Capillary refill takes less than 2 seconds.  Neurological:     General: No focal deficit present.     Mental Status: He is alert and oriented to person, place, and time.     Comments: Ambulates with cane  Psychiatric:        Mood and Affect: Mood normal.        Behavior: Behavior normal.    BP 128/74   Pulse 81   Temp 98 F (36.7 C) (Temporal)   Ht 5\' 8"  (1.727 m)   Wt 296 lb (134.3 kg)   SpO2 100%   BMI 45.01 kg/m   A1C today 5.7  EKG- NSR-Mary-Margaret Gaylyn Keas, FNP  Cg hest xray clear-Preliminary reading by Irvine Mantis, FNP  [facility]        Assessment & Plan:   Evan Smith comes in today with chief complaint of annual physical  Diagnosis and orders addressed:  1. Essential hypertension Low sodium diet - CBC with Differential/Platelet - CMP14+EGFR  2. Hyperlipidemia associated with type 2 diabetes mellitus [facility]) Lab Results  Component Value Date   CHOL 140 01/02/2023   HDL 49 01/02/2023   LDLCALC 69 01/02/2023   TRIG 126 01/02/2023   CHOLHDL 2.9 01/02/2023    - Lipid panel  3. Type 2 diabetes mellitus with diabetic polyneuropathy, without long-term current use of insulin (HCC) Continue  to watch carbs in diet - Bayer DCA Hb A1c Waived - Microalbumin / creatinine urine ratio  4. Screening for prostate cancer Labs pending - PSA, total and free  5. Gastroesophageal reflux disease with esophagitis without hemorrhage Avoid spicy foods Do not eat 2 hours prior to bedtime   6. Obstructive sleep apnea syndrome Wear cpap nightly  7. Neuropathy Continue neurontin - increased to 400mg  TID Keep check of feet  daily  8. Severe obesity (BMI >= 40) (HCC) Discussed diet and exercise for person with BMI >25 Will recheck weight in 3-6 months    Labs pending Health Maintenance reviewed Diet and exercise encouraged  Follow up plan: 6 months   Mary-Margaret Gaylyn Keas, FNP

## 2023-06-27 NOTE — Patient Instructions (Signed)
 Neuropathic Pain Neuropathic pain is pain caused by damage to the nerves that are responsible for certain sensations in your body (sensory nerves). Neuropathic pain can make you more sensitive to pain. Even a minor sensation can feel very painful. This is usually a long-term (chronic) condition that can be difficult to treat. The type of pain differs from person to person. It may: Start suddenly (acute), or it may develop slowly and become chronic. Come and go as damaged nerves heal, or it may stay at the same level for years. Cause emotional distress, loss of sleep, and a lower quality of life. What are the causes? The most common cause of this condition is diabetes. Many other diseases and conditions can also cause neuropathic pain. Causes of neuropathic pain can be classified as: Toxic. This is caused by medicines and chemicals. The most common causes of toxic neuropathic pain is damage from medicines that kill cancer cells (chemotherapy) or alcohol abuse. Metabolic. This can be caused by: Diabetes. Lack of vitamins like B12. Traumatic. Any injury that cuts, crushes, or stretches a nerve can cause damage and pain. Compression-related. If a sensory nerve gets trapped or compressed for a long period of time, the blood supply to the nerve can be cut off. Vascular. Many blood vessel diseases can cause neuropathic pain by decreasing blood supply and oxygen to nerves. Autoimmune. This type of pain results from diseases in which the body's defense system (immune system) mistakenly attacks sensory nerves. Examples of autoimmune diseases that can cause neuropathic pain include lupus and multiple sclerosis. Infectious. Many types of viral infections can damage sensory nerves and cause pain. Shingles infection is a common cause of this type of pain. Inherited. Neuropathic pain can be a symptom of many diseases that are passed down through families (genetic). What increases the risk? You are more likely to  develop this condition if: You have diabetes. You smoke. You drink too much alcohol. You are taking certain medicines, including chemotherapy or medicines that treat immune system disorders. What are the signs or symptoms? The main symptom is pain. Neuropathic pain is often described as: Burning. Shock-like. Stinging. Hot or cold. Itching. How is this diagnosed? No single test can diagnose neuropathic pain. It is diagnosed based on: A physical exam and your symptoms. Your health care provider will ask you about your pain. You may be asked to use a pain scale to describe how bad your pain is. Tests. These may be done to see if you have a cause and location of any nerve damage. They include: Nerve conduction studies and electromyography to test how well nerve signals travel through your nerves and muscles (electrodiagnostic testing). Skin biopsy to evaluate for small fiber neuropathy. Imaging studies, such as: X-rays. CT scan. MRI. How is this treated? Treatment for neuropathic pain may change over time. You may need to try different treatment options or a combination of treatments. Some options include: Treating the underlying cause of the neuropathy, such as diabetes, kidney disease, or vitamin deficiencies. Stopping medicines that can cause neuropathy, such as chemotherapy. Medicine to relieve pain. Medicines may include: Prescription or over-the-counter pain medicine. Anti-seizure medicine. Antidepressant medicines. Pain-relieving patches or creams that are applied to painful areas of skin. A medicine to numb the area (local anesthetic), which can be injected as a nerve block. Transcutaneous nerve stimulation. This uses electrical currents to block painful nerve signals. The treatment is painless. Alternative treatments, such as: Acupuncture. Meditation. Massage. Occupational or physical therapy. Pain management programs. Counseling. Follow  these instructions at  home: Medicines  Take over-the-counter and prescription medicines only as told by your health care provider. Ask your health care provider if the medicine prescribed to you: Requires you to avoid driving or using machinery. Can cause constipation. You may need to take these actions to prevent or treat constipation: Drink enough fluid to keep your urine pale yellow. Take over-the-counter or prescription medicines. Eat foods that are high in fiber, such as beans, whole grains, and fresh fruits and vegetables. Limit foods that are high in fat and processed sugars, such as fried or sweet foods. Lifestyle  Have a good support system at home. Consider joining a chronic pain support group. Do not use any products that contain nicotine or tobacco. These products include cigarettes, chewing tobacco, and vaping devices, such as e-cigarettes. If you need help quitting, ask your health care provider. Do not drink alcohol. General instructions Learn as much as you can about your condition. Work closely with all your health care providers to find the treatment plan that works best for you. Ask your health care provider what activities are safe for you. Keep all follow-up visits. This is important. Contact a health care provider if: Your pain treatments are not working. You are having side effects from your medicines. You are struggling with tiredness (fatigue), mood changes, depression, or anxiety. Get help right away if: You have thoughts of hurting yourself. Get help right away if you feel like you may hurt yourself or others, or have thoughts about taking your own life. Go to your nearest emergency room or: Call 911. Call the National Suicide Prevention Lifeline at 947-472-5826 or 988. This is open 24 hours a day. Text the Crisis Text Line at 657-070-3362. Summary Neuropathic pain is pain caused by damage to the nerves that are responsible for certain sensations in your body (sensory  nerves). Neuropathic pain may come and go as damaged nerves heal, or it may stay at the same level for years. Neuropathic pain is usually a long-term condition that can be difficult to treat. Consider joining a chronic pain support group. This information is not intended to replace advice given to you by your health care provider. Make sure you discuss any questions you have with your health care provider. Document Revised: 09/25/2020 Document Reviewed: 09/25/2020 Elsevier Patient Education  2024 [employer].

## 2023-06-28 LAB — CBC WITH DIFFERENTIAL/PLATELET
Basophils Absolute: 0 10*3/uL (ref 0.0–0.2)
Basos: 1 %
EOS (ABSOLUTE): 0.1 10*3/uL (ref 0.0–0.4)
Eos: 3 %
Hematocrit: 38 % (ref 37.5–51.0)
Hemoglobin: 12.6 g/dL — ABNORMAL LOW (ref 13.0–17.7)
Immature Grans (Abs): 0 10*3/uL (ref 0.0–0.1)
Immature Granulocytes: 0 %
Lymphocytes Absolute: 1 10*3/uL (ref 0.7–3.1)
Lymphs: 31 %
MCH: 32.9 pg (ref 26.6–33.0)
MCHC: 33.2 g/dL (ref 31.5–35.7)
MCV: 99 fL — ABNORMAL HIGH (ref 79–97)
Monocytes Absolute: 0.4 10*3/uL (ref 0.1–0.9)
Monocytes: 11 %
Neutrophils Absolute: 1.9 10*3/uL (ref 1.4–7.0)
Neutrophils: 54 %
Platelets: 160 10*3/uL (ref 150–450)
RBC: 3.83 x10E6/uL — ABNORMAL LOW (ref 4.14–5.80)
RDW: 12.6 % (ref 11.6–15.4)
WBC: 3.4 10*3/uL (ref 3.4–10.8)

## 2023-06-28 LAB — MICROALBUMIN / CREATININE URINE RATIO
Creatinine, Urine: 194.2 mg/dL
Microalb/Creat Ratio: 2 mg/g{creat} (ref 0–29)
Microalbumin, Urine: 3.8 ug/mL

## 2023-06-28 LAB — LIPID PANEL
Chol/HDL Ratio: 3.4 ratio (ref 0.0–5.0)
Cholesterol, Total: 154 mg/dL (ref 100–199)
HDL: 45 mg/dL (ref >39)
LDL Chol Calc (NIH): 52 mg/dL (ref 0–99)
Triglycerides: 377 mg/dL — ABNORMAL HIGH (ref 0–149)
VLDL Cholesterol Cal: 57 mg/dL — ABNORMAL HIGH (ref 5–40)

## 2023-06-28 LAB — CMP14+EGFR
ALT: 15 IU/L (ref 0–44)
AST: 21 IU/L (ref 0–40)
Albumin: 4.6 g/dL (ref 3.9–4.9)
Alkaline Phosphatase: 73 IU/L (ref 44–121)
BUN/Creatinine Ratio: 13 (ref 10–24)
BUN: 20 mg/dL (ref 8–27)
Bilirubin Total: <0.2 mg/dL (ref 0.0–1.2)
CO2: 17 mmol/L — ABNORMAL LOW (ref 20–29)
Calcium: 9.5 mg/dL (ref 8.6–10.2)
Chloride: 103 mmol/L (ref 96–106)
Creatinine, Ser: 1.57 mg/dL — ABNORMAL HIGH (ref 0.76–1.27)
Globulin, Total: 2.6 g/dL (ref 1.5–4.5)
Glucose: 95 mg/dL (ref 70–99)
Potassium: 4.5 mmol/L (ref 3.5–5.2)
Sodium: 143 mmol/L (ref 134–144)
Total Protein: 7.2 g/dL (ref 6.0–8.5)
eGFR: 47 mL/min/{1.73_m2} — ABNORMAL LOW (ref >59)

## 2023-06-28 LAB — VITAMIN B12: Vitamin B-12: 281 pg/mL (ref 232–1245)

## 2023-07-09 ENCOUNTER — Other Ambulatory Visit: Payer: Self-pay | Admitting: Nurse Practitioner

## 2023-07-09 DIAGNOSIS — K2100 Gastro-esophageal reflux disease with esophagitis, without bleeding: Secondary | ICD-10-CM

## 2023-07-09 DIAGNOSIS — I10 Essential (primary) hypertension: Secondary | ICD-10-CM

## 2023-07-24 ENCOUNTER — Telehealth: Payer: Self-pay

## 2023-07-24 NOTE — Telephone Encounter (Signed)
 I spoke to pt and advised he should not take meloxicam  due to his last kidney function results per MMM. I did discontinue it on his med list as well and advised pt to contact mail in pharmacy to cancel his meloxicam .

## 2023-07-24 NOTE — Addendum Note (Signed)
 Addended by: Genene Kennel on: 07/24/2023 03:39 PM   Modules accepted: Orders

## 2023-07-24 NOTE — Telephone Encounter (Signed)
 Copied from CRM [id number]. Topic: Clinical - Medication Question >> Jul 24, 2023  2:35 PM Tiffany S wrote: Reason for CRM: meloxicam  (MOBIC ) 15 MG tablet [[id number]]  Order   Patient is asking if he is suppose to continue the medication above please follow up with the patient

## 2023-08-14 ENCOUNTER — Ambulatory Visit: Payer: Self-pay

## 2023-08-14 NOTE — Telephone Encounter (Signed)
 FYI Only or Action Required?: Action required by provider: request for appointment.  Patient was last seen in primary care on 06/27/2023 by Evan Mustard, FNP. Called Nurse Triage reporting Sinusitis. Symptoms began about a month ago. Interventions attempted: OTC medications: allergy medication and nasal spray. Symptoms are: unchanged.  Triage Disposition: See PCP in  4 days.   Patient/caregiver understands and will follow disposition?: YesCopied from CRM [id number]. Topic: Clinical - Red Word Triage >> Aug 14, 2023  8:17 AM [name] wrote: Red Word that prompted transfer to Nurse Triage:  He is getting worse with congestion, running nose, a lot of mucus, watering eyes, stopped up, sneezing. He wants to see what to take and do for this. It has going on for a month. Reason for Disposition  [1] Sinus congestion (pressure, fullness) AND [2] present > 10 days  Answer Assessment - Initial Assessment Questions 1. LOCATION: Where does it hurt?      temples 2. ONSET: When did the sinus pain start?  (e.g., hours, days)      Month ago  3. SEVERITY: How bad is the pain?   (Scale 1-10; mild, moderate or severe)   - MILD (1-3): doesn't interfere with normal activities    - MODERATE (4-7): interferes with normal activities (e.g., work or school) or awakens from sleep   - SEVERE (8-10): excruciating pain and patient unable to do any normal activities        Denies  4. RECURRENT SYMPTOM: Have you ever had sinus problems before? If Yes, ask: When was the last time? and What happened that time?      Na  5. NASAL CONGESTION: Is the nose blocked? If Yes, ask: Can you open it or must you breathe through your mouth?     yes 6. NASAL DISCHARGE: Do you have discharge from your nose? If so ask, What color?     clear 7. FEVER: Do you have a fever? If Yes, ask: What is it, how was it measured, and when did it start?      denies 8. OTHER SYMPTOMS: Do you have any other symptoms?  (e.g., sore throat, cough, earache, difficulty breathing)     Sneezing, watery eyes, cough     Pt has tried allergy medication and nasal spray that was prescribed. Nothing is hleping pt.  Protocols used: Sinus Pain or Congestion-A-AH

## 2023-08-14 NOTE — Telephone Encounter (Signed)
 Appt scheduled for 08/18/2023

## 2023-08-18 ENCOUNTER — Encounter: Payer: Self-pay | Admitting: Nurse Practitioner

## 2023-08-18 ENCOUNTER — Ambulatory Visit (INDEPENDENT_AMBULATORY_CARE_PROVIDER_SITE_OTHER): Admitting: Nurse Practitioner

## 2023-08-18 VITALS — BP 121/65 | HR 91 | Temp 98.1°F | Ht 68.0 in | Wt 301.0 lb

## 2023-08-18 DIAGNOSIS — J069 Acute upper respiratory infection, unspecified: Secondary | ICD-10-CM

## 2023-08-18 MED ORDER — AZITHROMYCIN 250 MG PO TABS: ORAL_TABLET | ORAL | 0 refills | Status: DC

## 2023-08-18 NOTE — Progress Notes (Signed)
 Subjective:    Patient ID: Evan Smith, male    DOB: [birth year], 68 y.o.   MRN: [id number]  Chief Complaint: Nasal Congestion (Runny nose/)   URI  This is a new problem. The current episode started 1 to 4 weeks ago. The problem has been waxing and waning. Associated symptoms include congestion, coughing, rhinorrhea, sinus pain and sneezing. Pertinent negatives include no sore throat. He has tried antihistamine for the symptoms. The treatment provided mild relief.    Patient Active Problem List   Diagnosis Date Noted   Gastroesophageal reflux disease with esophagitis 04/01/2022   Erectile dysfunction due to arterial insufficiency 04/01/2022   Stenosis of cervical spine with myelopathy (HCC) 02/15/2020   Neck pain 01/19/2020   Myelopathy Smith diseases classified elsewhere (HCC) 12/20/2019   Essential hypertension 11/15/2019   Chronic midline low back pain without sciatica 11/15/2019   Cervical radiculopathy at C6 11/03/2019   Allergic rhinitis 11/24/2015   Severe obesity (BMI >= 40) (HCC) 11/24/2015   Neuropathy 03/24/2015   Diabetes (HCC) 03/09/2015   Hyperlipidemia 02/13/2012   Sleep apnea 02/13/2012       Review of Systems  Constitutional:  Negative for chills and fever.  HENT:  Positive for congestion, rhinorrhea, sinus pain and sneezing. Negative for sore throat.   Respiratory:  Positive for cough.        Objective:   Physical Exam Constitutional:      Appearance: Normal appearance. He is obese.  Cardiovascular:     Rate and Rhythm: Normal rate and regular rhythm.     Heart sounds: Normal heart sounds.  Pulmonary:     Breath sounds: Normal breath sounds.  Skin:    General: Skin is warm.  Neurological:     General: No focal deficit present.     Mental Status: He is alert and oriented to person, place, and time.  Psychiatric:        Mood and Affect: Mood normal.        Behavior: Behavior normal.    BP 121/65   Pulse 91   Temp 98.1 F (36.7 C)  (Temporal)   Ht 5' 8 (1.727 m)   Wt (!) 301 lb (136.5 kg)   SpO2 95%   BMI 45.77 kg/m         Assessment & Plan:   Evan Smith today with chief complaint of Nasal Congestion (Runny nose/)   1. URI with cough and congestion (Primary) 1. Take meds as prescribed 2. Use a cool mist humidifier especially during the winter months and when heat has been humid. 3. Use saline nose sprays frequently 4. Saline irrigations of the nose can be very helpful if done frequently.  * 4X daily for 1 week*  * Use of a nettie pot can be helpful with this. Follow directions with this* 5. Drink plenty of fluids 6. Keep thermostat turn down low 7.For any cough or congestion- flonase  Smith mornings and claritin  at night 8. For fever or aces or pains- take tylenol  or ibuprofen appropriate for age and weight.  * for fevers greater than 101 orally you may alternate ibuprofen and tylenol  every  3 hours.    - azithromycin  (ZITHROMAX  Z-PAK) 250 MG tablet; As directed  Dispense: 6 tablet; Refill: 0    The above assessment and management plan was discussed with the patient. The patient verbalized understanding of and has agreed to the management plan. Patient is aware to call the clinic if symptoms persist or worsen.  Patient is aware when to return to the clinic for a follow-up visit. Patient educated on when it is appropriate to go to the emergency department.   Evan Gladis, FNP

## 2023-08-18 NOTE — Patient Instructions (Signed)
 1. Take meds as prescribed 2. Use a cool mist humidifier especially during the winter months and when heat has been humid. 3. Use saline nose sprays frequently 4. Saline irrigations of the nose can be very helpful if done frequently.  * 4X daily for 1 week*  * Use of a nettie pot can be helpful with this. Follow directions with this* 5. Drink plenty of fluids 6. Keep thermostat turn down low 7.For any cough or congestion- flonase  in mornings and clartitin at night 8. For fever or aces or pains- take tylenol  or ibuprofen appropriate for age and weight.  * for fevers greater than 101 orally you may alternate ibuprofen and tylenol  every  3 hours.

## 2023-09-10 ENCOUNTER — Encounter: Payer: Self-pay | Admitting: Nurse Practitioner

## 2023-09-10 ENCOUNTER — Ambulatory Visit: Admitting: Nurse Practitioner

## 2023-09-10 VITALS — BP 110/61 | HR 95 | Temp 98.7°F | Ht 68.0 in | Wt 301.0 lb

## 2023-09-10 DIAGNOSIS — Z09 Encounter for follow-up examination after completed treatment for conditions other than malignant neoplasm: Secondary | ICD-10-CM

## 2023-09-10 DIAGNOSIS — R410 Disorientation, unspecified: Secondary | ICD-10-CM

## 2023-09-10 DIAGNOSIS — R55 Syncope and collapse: Secondary | ICD-10-CM | POA: Diagnosis not present

## 2023-09-10 DIAGNOSIS — R7989 Other specified abnormal findings of blood chemistry: Secondary | ICD-10-CM

## 2023-09-10 NOTE — Progress Notes (Signed)
 Established Patient Office Visit  Subjective  Patient ID: Evan Smith, male    DOB: 01-Jan-1956  Age: 68 y.o. MRN: [id number]  Chief Complaint  Patient presents with   Hospitalization Follow-up    Altered mental status dehydration    HPI Evan Smith is a 68 year old male who presents on October 01, 2023, for a hospital follow-up visit. He was admitted to the hospital on August 31, 2023, and discharged on September 01, 2023. Although the discharge summary indicates that he left against medical advice (AMA), the patient denies this and states he was formally discharged after being informed that his vital signs had improved.  The patient was admitted due to a syncopal episode that occurred while watching TV at home. He reports that he had not eaten that morning, as he was planning to eat later at a cookout, but took his medications on an empty stomach. He is currently wearing a heart monitor that was mailed back earlier this week. He has been informed that a cardiology appointment will be scheduled once the device is received and analyzed.  During hospitalization, troponin levels were noted to be elevated at 68. CTA chest with contrast ruled out pulmonary embolism and showed aortic and coronary atherosclerosis as well as cholelithiasis. CT head was unremarkable. Chest X-ray revealed no acute cardiopulmonary abnormality, with chronic changes noted.  Carotid Doppler indicated widely patent carotid arteries with small foci of plaque calcification in the distal left common carotid artery and bifurcation. Echocardiogram findings revealed: Normal left ventricular systolic function with an estimated LVEF of 55-60% Normal left atrial size Mildly thickened aortic valve leaflets with normal excursion No significant valvular regurgitation or transvalvular gradients Technically limited study overall but no significant abnormalities identified   He admits to consuming alcohol socially, typically 1-2  drinks weekly. Patient Active Problem List   Diagnosis Date Noted   Hospital discharge follow-up 09/10/2023   Gastroesophageal reflux disease with esophagitis 04/01/2022   Erectile dysfunction due to arterial insufficiency 04/01/2022   Stenosis of cervical spine with myelopathy (HCC) 02/15/2020   Neck pain 01/19/2020   Myelopathy in diseases classified elsewhere (HCC) 12/20/2019   Essential hypertension 11/15/2019   Chronic midline low back pain without sciatica 11/15/2019   Cervical radiculopathy at C6 11/03/2019   Allergic rhinitis 11/24/2015   Severe obesity (BMI >= 40) (HCC) 11/24/2015   Neuropathy 03/24/2015   Diabetes (HCC) 03/09/2015   Hyperlipidemia 02/13/2012   Sleep apnea 02/13/2012   Past Medical History:  Diagnosis Date   Allergic rhinitis    by referral on 11/02/19/   Arthritis    Cataract    Chronic left shoulder pain    From [facility] received 11/02/19/   DDD (degenerative disc disease)    Dyspnea    on exertion    GERD (gastroesophageal reflux disease)    Hyperlipidemia    Hypertension    Intestinal flu 2000   OSA on CPAP    MODERATE OSA PER STUDY 2010   Perianal fistula    PONV (postoperative nausea and vomiting)    and hear to wake   Severe obesity (BMI >= 40) (HCC)    Received from [facility] on 11/02/19/   Type 2 diabetes mellitus with diabetic polyneuropathy, without long-term current use of insulin (HCC)    pt. states he's borderline   Past Surgical History:  Procedure Laterality Date   ANTERIOR CERVICAL DECOMP/DISCECTOMY FUSION N/A 02/15/2020   Procedure: ANTERIOR CERVICAL DECOMPRESSON FUSION CERVICAL THREE-FOUR, CERVICAL FOUR-FIVE,CERVICAL FIVE-SIX.;  Surgeon: Lanis,  Gerldine, MD;  Location: [facility] OR;  Service: Neurosurgery;  Laterality: N/A;  anterior   CERVICAL FUSION  1990's   EVALUATION UNDER ANESTHESIA WITH ANAL FISTULECTOMY N/A 08/03/2012   Procedure: EXAM UNDER ANESTHESIA WITH ANAL FISTULECTOMY ;  Surgeon: Bernarda Ned, MD;  Location: [facility]  [place];  Service: General;  Laterality: N/A;   INCISION AND DRAINAGE PERIRECTAL ABSCESS  02/13/2012   Procedure: IRRIGATION AND DEBRIDEMENT PERIRECTAL ABSCESS;  Surgeon: Krystal CHRISTELLA Spinner, MD;  Location: [facility] OR;  Service: General;  Laterality: N/A;   LUMBAR FUSION  2010   has had 2 low back surgeries    RIGHT HYDROCELECTOMY  07-28-2001   SHOULDER ARTHROSCOPY WITH ROTATOR CUFF REPAIR Left 1990's   Social History   Tobacco Use   Smoking status: Former    Current packs/day: 0.00    Average packs/day: 0.5 packs/day for 25.0 years (12.5 ttl pk-yrs)    Types: Cigarettes    Start date: 02/12/1979    Quit date: 02/12/2004    Years since quitting: 19.5   Smokeless tobacco: Never  Vaping Use   Vaping status: Never Used  Substance Use Topics   Alcohol use: Yes    Comment: occassional   Drug use: No   Social History   Socioeconomic History   Marital status: Divorced    Spouse name: Not on file   Number of children: 2   Years of education: Not on file   Highest education level: 11th grade  Occupational History   Occupation: Disabled    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   Tobacco Use   Smoking status: Former    Current packs/day: 0.00    Average packs/day: 0.5 packs/day for 25.0 years (12.5 ttl pk-yrs)    Types: Cigarettes    Start date: 02/12/1979    Quit date: 02/12/2004    Years since quitting: 19.5   Smokeless tobacco: Never  Vaping Use   Vaping status: Never Used  Substance and Sexual Activity   Alcohol use: Yes    Comment: occassional   Drug use: No   Sexual activity: Yes  Other Topics Concern   Not on file  Social History Narrative   Lives by self   No caffeine, very seldom   Right handed   Lives alone on ground level senior apartment   Social Drivers of Health   Financial Resource Strain: Medium Risk (09/01/2023)   Received from [facility]   Overall Financial Resource Strain (CARDIA)    How hard is it for you to pay for the very basics like food,  housing, medical care, and heating?: Somewhat hard  Food Insecurity: Food Insecurity Present (09/01/2023)   Received from [facility]   Hunger Vital Sign    Within the past 12 months, you worried that your food would run out before you got the money to buy more.: Sometimes true    Within the past 12 months, the food you bought just didn't last and you didn't have money to get more.: Sometimes true  Transportation Needs: No Transportation Needs (09/01/2023)   Received from [facility]   PRAPARE - Transportation    Lack of Transportation (Medical): No    Lack of Transportation (Non-Medical): No  Physical Activity: Insufficiently Active (09/01/2023)   Received from [facility]   Exercise Vital Sign    On average, how many days per week do you engage in moderate to strenuous exercise (like a brisk walk)?: 2 days  On average, how many minutes do you engage in exercise at this level?: 20 min  Stress: No Stress Concern Present (09/01/2023)   Received from [facility] - Occupational Stress Questionnaire    Do you feel stress - tense, restless, nervous, or anxious, or unable to sleep at night because your mind is troubled all the time - these days?: Not at all  Social Connections: Moderately Isolated (09/01/2023)   Received from [facility]   Social Connection and Isolation Panel    In a typical week, how many times do you talk on the phone with family, friends, or neighbors?: More than three times a week    How often do you get together with friends or relatives?: Twice a week    How often do you attend church or religious services?: More than 4 times per year    Do you belong to any clubs or organizations such as church groups, unions, fraternal or athletic groups, or school groups?: No    How often do you attend meetings of the clubs or organizations you belong to?: Never    Are you married, widowed, divorced, separated, never married,  or living with a partner?: Divorced  Intimate Partner Violence: Unknown (09/01/2023)   Received from [facility]   Humiliation, Afraid, Rape, and Kick questionnaire    Within the last year, have you been afraid of your partner or ex-partner?: No    Within the last year, have you been humiliated or emotionally abused in other ways by your partner or ex-partner?: No    Within the last year, have you been kicked, hit, slapped, or otherwise physically hurt by your partner or ex-partner?: No    Sexually Abused: Not on file   Family Status  Relation Name Status   Mother  Deceased at age 64       brain tumor   Father  Deceased at age 58   Sister nancy Alive   Brother Film/video editor Deceased at age 70       MVI   Sister Chief Financial Officer Alive   Sister viola Alive   Sister faye Alive   Brother robert Deceased   Brother sammy Alive   Son derick Alive   Pat Uncle  Deceased   Son Griffen Alive  No partnership data on file   Family History  Problem Relation Age of Onset   Brain cancer Mother        tumor   Diabetes Mother    Kidney Stones Father    Diabetes Sister    Heart disease Sister    GI problems Sister    Cancer Brother        prostate   Cancer Brother        lymphoma    Heart disease Brother    Blindness Son    Arthritis Son        trouble with legs   Heart disease Paternal Uncle    Arthritis Son        shoulder and back    Allergies  Allergen Reactions   Hydrocodone      Other reaction(s): itch   Hydrocodeine [Dihydrocodeine] Nausea And Vomiting      Review of Systems  Constitutional:  Negative for chills and fever.  HENT:  Negative for congestion and sore throat.   Respiratory:  Negative for cough, shortness of breath and wheezing.   Cardiovascular:  Negative for chest pain and leg swelling.  Gastrointestinal:  Negative for blood in stool, melena, nausea and vomiting.  Musculoskeletal:  Negative for falls.  Skin:  Negative for itching and rash.  Neurological:  Negative for  dizziness and headaches.   Negative unless indicated in HPI   Objective:     BP 110/61   Pulse 95   Temp 98.7 F (37.1 C)   Ht 5' 8 (1.727 m)   Wt (!) 301 lb (136.5 kg)   SpO2 96%   BMI 45.77 kg/m  BP Readings from Last 3 Encounters:  09/10/23 110/61  08/18/23 121/65  06/27/23 128/74   Wt Readings from Last 3 Encounters:  09/10/23 (!) 301 lb (136.5 kg)  08/18/23 (!) 301 lb (136.5 kg)  06/27/23 296 lb (134.3 kg)      Physical Exam Vitals and nursing note reviewed.  HENT:     Head: Normocephalic and atraumatic.     Right Ear: Tympanic membrane, ear canal and external ear normal.     Left Ear: Tympanic membrane and external ear normal. There is no impacted cerumen.     Nose: Nose normal.     Mouth/Throat:     Mouth: Mucous membranes are moist.  Eyes:     General: No scleral icterus.    Extraocular Movements: Extraocular movements intact.     Conjunctiva/sclera: Conjunctivae normal.     Pupils: Pupils are equal, round, and reactive to light.  Cardiovascular:     Heart sounds: Normal heart sounds.  Pulmonary:     Effort: Pulmonary effort is normal.     Breath sounds: Normal breath sounds.  Abdominal:     General: Bowel sounds are normal.     Palpations: Abdomen is soft.  Musculoskeletal:        General: Normal range of motion.     Right lower leg: No edema.     Left lower leg: No edema.  Skin:    General: Skin is warm and dry.     Findings: No rash.  Neurological:     Mental Status: He is alert and oriented to person, place, and time.     Comments: Ambulate with slow steady gait with a cane  Psychiatric:        Mood and Affect: Mood normal.        Behavior: Behavior normal.        Thought Content: Thought content normal.        Judgment: Judgment normal.      No results found for any visits on 09/10/23.  Last CBC Lab Results  Component Value Date   WBC 3.4 06/27/2023   HGB 12.6 (L) 06/27/2023   HCT 38.0 06/27/2023   MCV 99 (H) 06/27/2023   MCH  32.9 06/27/2023   RDW 12.6 06/27/2023   PLT 160 06/27/2023   Last metabolic panel Lab Results  Component Value Date   GLUCOSE 95 06/27/2023   NA 143 06/27/2023   K 4.5 06/27/2023   CL 103 06/27/2023   CO2 17 (L) 06/27/2023   BUN 20 06/27/2023   CREATININE 1.57 (H) 06/27/2023   EGFR 47 (L) 06/27/2023   CALCIUM  9.5 06/27/2023   PROT 7.2 06/27/2023   ALBUMIN 4.6 06/27/2023   LABGLOB 2.6 06/27/2023   AGRATIO 1.7 07/01/2022   BILITOT <0.2 06/27/2023   ALKPHOS 73 06/27/2023   AST 21 06/27/2023   ALT 15 06/27/2023   ANIONGAP 12 02/16/2020   Last lipids Lab Results  Component Value Date   CHOL 154 06/27/2023   HDL  45 06/27/2023   LDLCALC 52 06/27/2023   TRIG 377 (H) 06/27/2023   CHOLHDL 3.4 06/27/2023   Last hemoglobin A1c Lab Results  Component Value Date   HGBA1C 5.7 (H) 06/27/2023   Last thyroid  functions Lab Results  Component Value Date   TSH 3.430 09/22/2014   T4TOTAL 7.0 09/22/2014        Assessment & Plan:  Syncope, unspecified syncope type  Delirium  Hospital discharge follow-up  Elevated troponin   Wallis is a 68 year old African-American male seen today for hospital discharge follow-up, no acute distress Sent monitor on 09/14/2023 as scheduled an follow through with cardiology referral Continue taking al previously prescribed medications, no medications refills need today     The above assessment and management plan was discussed with the patient. The patient verbalized understanding of and has agreed to the management plan. Patient is aware to call the clinic if they develop any new symptoms or if symptoms persist or worsen. Patient is aware when to return to the clinic for a follow-up visit. Patient educated on when it is appropriate to go to the emergency department.  Return if symptoms worsen or fail to improve.    Evan Havlin [place] Thompson, DNP [facility] [address] [place], [place] [address] [phone]    Note: This document was prepared by [employer] voice dictation technology and any errors that results from this process are unintentional.

## 2023-09-25 DIAGNOSIS — Z008 Encounter for other general examination: Secondary | ICD-10-CM | POA: Diagnosis not present

## 2023-09-25 DIAGNOSIS — E1169 Type 2 diabetes mellitus with other specified complication: Secondary | ICD-10-CM | POA: Diagnosis not present

## 2023-09-25 DIAGNOSIS — I129 Hypertensive chronic kidney disease with stage 1 through stage 4 chronic kidney disease, or unspecified chronic kidney disease: Secondary | ICD-10-CM | POA: Diagnosis not present

## 2023-09-25 DIAGNOSIS — F17211 Nicotine dependence, cigarettes, in remission: Secondary | ICD-10-CM | POA: Diagnosis not present

## 2023-09-25 DIAGNOSIS — E785 Hyperlipidemia, unspecified: Secondary | ICD-10-CM | POA: Diagnosis not present

## 2023-09-25 DIAGNOSIS — N1832 Chronic kidney disease, stage 3b: Secondary | ICD-10-CM | POA: Diagnosis not present

## 2023-09-25 DIAGNOSIS — R269 Unspecified abnormalities of gait and mobility: Secondary | ICD-10-CM | POA: Diagnosis not present

## 2023-09-25 DIAGNOSIS — Z6841 Body Mass Index (BMI) 40.0 and over, adult: Secondary | ICD-10-CM | POA: Diagnosis not present

## 2023-09-25 DIAGNOSIS — E1122 Type 2 diabetes mellitus with diabetic chronic kidney disease: Secondary | ICD-10-CM | POA: Diagnosis not present

## 2023-09-29 ENCOUNTER — Other Ambulatory Visit: Payer: Self-pay | Admitting: Nurse Practitioner

## 2023-09-29 DIAGNOSIS — G629 Polyneuropathy, unspecified: Secondary | ICD-10-CM

## 2023-09-29 DIAGNOSIS — J309 Allergic rhinitis, unspecified: Secondary | ICD-10-CM

## 2023-09-29 NOTE — Telephone Encounter (Unsigned)
 Copied from CRM #[id number]. Topic: Clinical - Medication Refill >> Sep 29, 2023  2:30 PM Yolanda T wrote: Medication: gabapentin  (NEURONTIN ) 400 MG capsule, loratadine  (CLARITIN ) 10 MG tablet, potassium gluconate 595 (99 K) MG TABS tablet and Cholecalciferol (VITAMIN D3) 1000 UNITS CAPS  Has the patient contacted their pharmacy? Yes  This is the patient's preferred pharmacy:  [facility] - [place], [place] - [address] [place] [address] Phone: [phone] Fax: [phone]  Is this the correct pharmacy for this prescription? Yes  Has the prescription been filled recently? Yes  Is the patient out of the medication? Yes  Has the patient been seen for an appointment in the last year OR does the patient have an upcoming appointment? Yes  Can we respond through MyChart? Yes  Agent: Please be advised that Rx refills may take up to 3 business days. We ask that you follow-up with your pharmacy.

## 2023-09-30 DIAGNOSIS — R55 Syncope and collapse: Secondary | ICD-10-CM | POA: Diagnosis not present

## 2023-09-30 MED ORDER — LORATADINE 10 MG PO TABS: 10.0000 mg | ORAL_TABLET | Freq: Every day | ORAL | 1 refills | Status: DC

## 2023-09-30 MED ORDER — GABAPENTIN 400 MG PO CAPS: 400.0000 mg | ORAL_CAPSULE | Freq: Three times a day (TID) | ORAL | 0 refills | Status: DC

## 2023-09-30 MED ORDER — VITAMIN D3 25 MCG (1000 UT) PO CAPS: 1000.0000 [IU] | ORAL_CAPSULE | Freq: Every day | ORAL | 0 refills | Status: DC

## 2023-09-30 MED ORDER — POTASSIUM GLUCONATE 595 (99 K) MG PO TABS: 595.0000 mg | ORAL_TABLET | Freq: Every day | ORAL | 1 refills | Status: DC

## 2023-10-31 ENCOUNTER — Other Ambulatory Visit: Payer: Self-pay | Admitting: Family

## 2023-10-31 DIAGNOSIS — N5201 Erectile dysfunction due to arterial insufficiency: Secondary | ICD-10-CM

## 2023-11-26 ENCOUNTER — Telehealth: Payer: Self-pay

## 2023-11-26 ENCOUNTER — Other Ambulatory Visit: Payer: Self-pay | Admitting: Nurse Practitioner

## 2023-11-26 DIAGNOSIS — I10 Essential (primary) hypertension: Secondary | ICD-10-CM

## 2023-11-26 DIAGNOSIS — K2100 Gastro-esophageal reflux disease with esophagitis, without bleeding: Secondary | ICD-10-CM

## 2023-11-26 DIAGNOSIS — E1142 Type 2 diabetes mellitus with diabetic polyneuropathy: Secondary | ICD-10-CM

## 2023-11-26 DIAGNOSIS — G629 Polyneuropathy, unspecified: Secondary | ICD-10-CM

## 2023-11-26 DIAGNOSIS — N5201 Erectile dysfunction due to arterial insufficiency: Secondary | ICD-10-CM

## 2023-11-26 DIAGNOSIS — H6993 Unspecified Eustachian tube disorder, bilateral: Secondary | ICD-10-CM

## 2023-11-26 DIAGNOSIS — E782 Mixed hyperlipidemia: Secondary | ICD-10-CM

## 2023-11-26 DIAGNOSIS — J309 Allergic rhinitis, unspecified: Secondary | ICD-10-CM

## 2023-11-26 MED ORDER — OMEPRAZOLE 40 MG PO CPDR: 40.0000 mg | DELAYED_RELEASE_CAPSULE | Freq: Every morning | ORAL | 0 refills | Status: DC

## 2023-11-26 MED ORDER — SIMVASTATIN 40 MG PO TABS
40.0000 mg | ORAL_TABLET | Freq: Every day | ORAL | 0 refills | Status: DC
Start: 2023-11-26 — End: 2023-12-23

## 2023-11-26 MED ORDER — LORATADINE 10 MG PO TABS
10.0000 mg | ORAL_TABLET | Freq: Every day | ORAL | 0 refills | Status: AC
Start: 2023-11-26 — End: ?

## 2023-11-26 MED ORDER — POTASSIUM GLUCONATE 595 (99 K) MG PO TABS: 595.0000 mg | ORAL_TABLET | Freq: Every day | ORAL | 0 refills | Status: AC

## 2023-11-26 MED ORDER — TRUEPLUS LANCETS 33G MISC: 3 refills | Status: DC

## 2023-11-26 MED ORDER — DROPSAFE ALCOHOL PREP 70 % PADS: MEDICATED_PAD | 3 refills | Status: AC

## 2023-11-26 MED ORDER — GABAPENTIN 400 MG PO CAPS: 400.0000 mg | ORAL_CAPSULE | Freq: Three times a day (TID) | ORAL | 0 refills | Status: DC

## 2023-11-26 MED ORDER — SILDENAFIL CITRATE 20 MG PO TABS: 20.0000 mg | ORAL_TABLET | ORAL | 0 refills | Status: AC | PRN

## 2023-11-26 MED ORDER — AMLODIPINE BESYLATE 5 MG PO TABS
5.0000 mg | ORAL_TABLET | Freq: Every day | ORAL | 0 refills | Status: DC
Start: 2023-11-26 — End: 2023-12-23

## 2023-11-26 MED ORDER — TRUE METRIX BLOOD GLUCOSE TEST VI STRP: ORAL_STRIP | 3 refills | Status: AC

## 2023-11-26 MED ORDER — LISINOPRIL-HYDROCHLOROTHIAZIDE 20-25 MG PO TABS
1.0000 | ORAL_TABLET | Freq: Every day | ORAL | 0 refills | Status: DC
Start: 2023-11-26 — End: 2023-12-23

## 2023-11-26 MED ORDER — VITAMIN D3 25 MCG (1000 UT) PO CAPS: 1000.0000 [IU] | ORAL_CAPSULE | Freq: Every day | ORAL | 0 refills | Status: AC

## 2023-11-26 MED ORDER — METFORMIN HCL ER 500 MG PO TB24
ORAL_TABLET | ORAL | 0 refills | Status: DC
Start: 2023-11-26 — End: 2023-12-23

## 2023-11-26 NOTE — Telephone Encounter (Signed)
 Copied from CRM [id number]. Topic: Clinical - Medication Refill >> Nov 26, 2023  1:42 PM Yolanda T wrote: Medication: Alcohol Swabs (DROPSAFE ALCOHOL PREP) 70 % PADS, amLODipine  (NORVASC ) 5 MG tablet, fluticasone  (FLONASE ) 50 MCG/ACT nasal spray, gabapentin  (NEURONTIN ) 400 MG capsule, glucose blood (TRUE METRIX BLOOD GLUCOSE TEST) test strip, lisinopril -hydrochlorothiazide  (ZESTORETIC ) 20-25 MG tablet, metFORMIN  (GLUCOPHAGE -XR) 500 MG 24 hr tablet, omeprazole  (PRILOSEC) 40 MG capsule, simvastatin  (ZOCOR ) 40 MG tablet and TRUEplus Lancets 33G MISC  Has the patient contacted their pharmacy? Yes (Agent: If no, request that the patient contact the pharmacy for the refill. If patient does not wish to contact the pharmacy document the reason why and proceed with request.) (Agent: If yes, when and what did the pharmacy advise?)  This is the patient's preferred pharmacy:  [facility] - [place], [place] - [address] [facility] AT Portal to Reg[facility] [address] [place] [address] Phone: [phone] Fax: [phone]  Is this the correct pharmacy for this prescription? Yes  Has the prescription been filled recently? Yes  Is the patient out of the medication? No  Has the patient been seen for an appointment in the last year OR does the patient have an upcoming appointment? Yes  Can we respond through MyChart? Yes  Agent: Please be advised that Rx refills may take up to 3 business days. We ask that you follow-up with your pharmacy.

## 2023-11-26 NOTE — Telephone Encounter (Signed)
 90 day supply of medications that Evan Smith prescribes sent to [facility].

## 2023-11-26 NOTE — Telephone Encounter (Signed)
 Copied from CRM #[id number]. Topic: Clinical - Medication Question >> Nov 26, 2023  1:39 PM Myrick T wrote: Reason for CRM: Bobbette S from [facility] called to have all patients medication sent to: [facility] - [place], [place] - [facility] AT Portal to Registered [facility] Sites  Phone: [phone] Fax: [phone]

## 2023-12-01 ENCOUNTER — Other Ambulatory Visit (HOSPITAL_COMMUNITY): Payer: Self-pay

## 2023-12-01 ENCOUNTER — Telehealth: Payer: Self-pay

## 2023-12-01 NOTE — Telephone Encounter (Signed)
*  [facility]  Pharmacy Patient Advocate Encounter   Received notification from Fax that prior authorization for Sildenafil  20mg  is required/requested.   Insurance verification completed.   The patient is insured through [facility].   Per test claim: PA required; PA submitted to above mentioned insurance via Latent Key/confirmation #/EOC [id number] Status is pending

## 2023-12-02 NOTE — Telephone Encounter (Signed)
 Pharmacy Patient Advocate Encounter  Received notification from [facility] that Prior Authorization for Sildenafil  has been DENIED.  Full denial letter will be uploaded to the media tab. See denial reason below.  Patient does not meet PAH diagnostic criteria required for SILDENAFIL  CITRATE Tablet; prior authorization requirements not met.

## 2023-12-03 NOTE — Telephone Encounter (Unsigned)
 Copied from CRM [id number]. Topic: Clinical - Medication Question >> Dec 02, 2023  5:57 PM Nathanel BROCKS wrote: Reason for CRM: sildenafil  (REVATIO ) 20 MG tablet  Pt requested an appeal for this medication through [facility].   Please call back with diagnosis, clinical infor, [phone] case [id number]

## 2023-12-05 ENCOUNTER — Telehealth: Payer: Self-pay | Admitting: Family Medicine

## 2023-12-05 ENCOUNTER — Other Ambulatory Visit (HOSPITAL_COMMUNITY): Payer: Self-pay

## 2023-12-05 NOTE — Telephone Encounter (Signed)
 Copied from CRM [id number]. Topic: Clinical - Medication Question >> Dec 02, 2023  5:57 PM Nathanel BROCKS wrote: Reason for CRM: sildenafil  (REVATIO ) 20 MG tablet  Pt requested an appeal for this medication through [facility].   Please call back with diagnosis, clinical infor, [phone] case [id number] >> Dec 05, 2023 10:27 AM Emylou G wrote: [facility] is the [facility].. wants to know what the sildenafil  (REVATIO ) 20 MG tablet is used for?  It requires Prior Auth so they want to know what is it used for? Pls call her back [phone].

## 2023-12-05 NOTE — Telephone Encounter (Signed)
 Pt informed to use Goodrx. Insurnace will not cover. LS

## 2023-12-22 ENCOUNTER — Ambulatory Visit: Payer: Self-pay | Admitting: Nurse Practitioner

## 2023-12-23 ENCOUNTER — Encounter: Payer: Self-pay | Admitting: Nurse Practitioner

## 2023-12-23 ENCOUNTER — Ambulatory Visit (INDEPENDENT_AMBULATORY_CARE_PROVIDER_SITE_OTHER): Payer: Self-pay | Admitting: Nurse Practitioner

## 2023-12-23 VITALS — BP 125/70 | HR 90 | Temp 98.6°F | Ht 68.0 in | Wt 301.0 lb

## 2023-12-23 DIAGNOSIS — E1142 Type 2 diabetes mellitus with diabetic polyneuropathy: Secondary | ICD-10-CM

## 2023-12-23 DIAGNOSIS — K2100 Gastro-esophageal reflux disease with esophagitis, without bleeding: Secondary | ICD-10-CM

## 2023-12-23 DIAGNOSIS — E782 Mixed hyperlipidemia: Secondary | ICD-10-CM

## 2023-12-23 DIAGNOSIS — G4733 Obstructive sleep apnea (adult) (pediatric): Secondary | ICD-10-CM

## 2023-12-23 DIAGNOSIS — Z23 Encounter for immunization: Secondary | ICD-10-CM | POA: Diagnosis not present

## 2023-12-23 DIAGNOSIS — G629 Polyneuropathy, unspecified: Secondary | ICD-10-CM

## 2023-12-23 DIAGNOSIS — I10 Essential (primary) hypertension: Secondary | ICD-10-CM | POA: Diagnosis not present

## 2023-12-23 DIAGNOSIS — Z7984 Long term (current) use of oral hypoglycemic drugs: Secondary | ICD-10-CM

## 2023-12-23 LAB — LIPID PANEL

## 2023-12-23 LAB — BAYER DCA HB A1C WAIVED: HB A1C (BAYER DCA - WAIVED): 5.6 % (ref 4.8–5.6)

## 2023-12-23 MED ORDER — METFORMIN HCL ER 500 MG PO TB24: ORAL_TABLET | ORAL | 1 refills | Status: AC

## 2023-12-23 MED ORDER — GABAPENTIN 400 MG PO CAPS: 400.0000 mg | ORAL_CAPSULE | Freq: Three times a day (TID) | ORAL | 1 refills | Status: AC

## 2023-12-23 MED ORDER — SIMVASTATIN 40 MG PO TABS: 40.0000 mg | ORAL_TABLET | Freq: Every day | ORAL | 1 refills | Status: AC

## 2023-12-23 MED ORDER — OMEPRAZOLE 40 MG PO CPDR
40.0000 mg | DELAYED_RELEASE_CAPSULE | Freq: Every morning | ORAL | 1 refills | Status: AC
Start: 2023-12-23 — End: ?

## 2023-12-23 MED ORDER — LISINOPRIL-HYDROCHLOROTHIAZIDE 20-25 MG PO TABS: 1.0000 | ORAL_TABLET | Freq: Every day | ORAL | 1 refills | Status: AC

## 2023-12-23 MED ORDER — AMLODIPINE BESYLATE 5 MG PO TABS: 5.0000 mg | ORAL_TABLET | Freq: Every day | ORAL | 1 refills | Status: AC

## 2023-12-23 NOTE — Patient Instructions (Signed)

## 2023-12-23 NOTE — Progress Notes (Signed)
 Subjective:    Patient ID: Evan Smith, male    DOB: 07-09-1955, 68 y.o.   MRN: [id number]   Chief Complaint: medical management of chronic issues    HPI:  Evan Smith is a 68 y.o. who identifies as a male who was assigned male at birth.   Social history: Lives with: alone Work history: disability   Comes in today for follow up of the following chronic medical issues:  1. Essential hypertension Does not check BP at home. Denies chest pain, sob, headaches. BP Readings from Last 3 Encounters:  09/10/23 110/61  08/18/23 121/65  06/27/23 128/74     2. Hyperlipidemia associated with type 2 diabetes mellitus (HCC) Somewhat tries to watch diet; no dedicated exercise. Lab Results  Component Value Date   CHOL 154 06/27/2023   HDL 45 06/27/2023   LDLCALC 52 06/27/2023   TRIG 377 (H) 06/27/2023   CHOLHDL 3.4 06/27/2023     3. Type 2 diabetes mellitus with diabetic polyneuropathy, without long-term current use of insulin (HCC) Does not check blood sugars daily; somewhat tries to watch diet. No problems with low sugar that he is aware of Lab Results  Component Value Date   HGBA1C 5.7 (H) 06/27/2023     4. Screening for prostate cancer No issues.  Lab Results  Component Value Date   PSA1 0.5 07/01/2022   PSA1 0.4 06/13/2020   PSA1 0.5 03/09/2015   PSA 0.4 04/12/2013      5. Gastroesophageal reflux disease with esophagitis without hemorrhage Doing well on daily omeprazole .  6. Obstructive sleep apnea syndrome Uses CPAP at night and doing well.  7. Neuropathy Feels like things are crawling up his legs; some days are better than other.  8. Severe obesity (BMI >= 40) (HCC) No recent weight change.  Wt Readings from Last 3 Encounters:  12/23/23 (!) 301 lb (136.5 kg)  09/10/23 (!) 301 lb (136.5 kg)  08/18/23 (!) 301 lb (136.5 kg)   BMI Readings from Last 3 Encounters:  12/23/23 45.77 kg/m  09/10/23 45.77 kg/m  08/18/23 45.77 kg/m     New  complaints: None today  Allergies  Allergen Reactions   Hydrocodone      Other reaction(s): itch   Hydrocodeine [Dihydrocodeine] Nausea And Vomiting   Outpatient Encounter Medications as of 12/23/2023  Medication Sig   Alcohol Swabs (DROPSAFE ALCOHOL PREP) 70 % PADS Check BS QID Dx E11.9   amLODipine  (NORVASC ) 5 MG tablet Take 1 tablet (5 mg total) by mouth daily.   aspirin  81 MG tablet Take 1 tablet (81 mg total) by mouth daily.   azithromycin  (ZITHROMAX  Z-PAK) 250 MG tablet As directed   Blood Glucose Calibration (TRUE METRIX LEVEL 1) Low SOLN USE AS DIRECTED   Blood Glucose Monitoring Suppl (TRUE METRIX AIR GLUCOSE METER) w/Device KIT 1 Device by Does not apply route daily. Dx E11.9   Blood Glucose Monitoring Suppl (TRUE METRIX AIR GLUCOSE METER) w/Device KIT USE UP TO FOUR TIMES DAILY AS DIRECTED.   Blood Glucose Monitoring Suppl DEVI 1 each by Does not apply route in the morning, at noon, and at bedtime. May substitute to any manufacturer covered by patient's insurance.   cetirizine  (ZYRTEC  ALLERGY) 10 MG tablet Take 1 tablet (10 mg total) by mouth daily.   Cholecalciferol (VITAMIN D3) 25 MCG (1000 UT) CAPS Take 1 capsule (1,000 Units total) by mouth daily.   fluticasone  (FLONASE ) 50 MCG/ACT nasal spray USE 2 SPRAYS IN EACH NOSTRIL EVERY DAY  gabapentin  (NEURONTIN ) 400 MG capsule Take 1 capsule (400 mg total) by mouth 3 (three) times daily.   glucose blood (TRUE METRIX BLOOD GLUCOSE TEST) test strip Check BS QID Dx E11.9   lisinopril -hydrochlorothiazide  (ZESTORETIC ) 20-25 MG tablet Take 1 tablet by mouth daily.   loratadine  (CLARITIN ) 10 MG tablet Take 1 tablet (10 mg total) by mouth daily.   metFORMIN  (GLUCOPHAGE -XR) 500 MG 24 hr tablet TAKE 1 TABLET EVERY DAY WITH BREAKFAST   omeprazole  (PRILOSEC) 40 MG capsule Take 1 capsule (40 mg total) by mouth every morning.   potassium gluconate 595 (99 K) MG TABS tablet Take 1 tablet (595 mg total) by mouth daily.   sildenafil  (REVATIO ) 20  MG tablet Take 1-2 tablets (20-40 mg total) by mouth as needed.   simvastatin  (ZOCOR ) 40 MG tablet Take 1 tablet (40 mg total) by mouth at bedtime.   sodium chloride  (OCEAN) 0.65 % SOLN nasal spray Place 1 spray into both nostrils as needed for congestion.   TRUEplus Lancets 33G MISC Check BS QID Dx E11.9   No facility-administered encounter medications on file as of 12/23/2023.    Past Surgical History:  Procedure Laterality Date   ANTERIOR CERVICAL DECOMP/DISCECTOMY FUSION N/A 02/15/2020   Procedure: ANTERIOR CERVICAL DECOMPRESSON FUSION CERVICAL THREE-FOUR, CERVICAL FOUR-FIVE,CERVICAL FIVE-SIX.;  Surgeon: Lanis Pupa, MD;  Location: [facility] OR;  Service: Neurosurgery;  Laterality: N/A;  anterior   CERVICAL FUSION  1990's   EVALUATION UNDER ANESTHESIA WITH ANAL FISTULECTOMY N/A 08/03/2012   Procedure: EXAM UNDER ANESTHESIA WITH ANAL FISTULECTOMY ;  Surgeon: Bernarda Ned, MD;  Location: [facility] [place];  Service: General;  Laterality: N/A;   INCISION AND DRAINAGE PERIRECTAL ABSCESS  02/13/2012   Procedure: IRRIGATION AND DEBRIDEMENT PERIRECTAL ABSCESS;  Surgeon: Krystal CHRISTELLA Spinner, MD;  Location: [facility] OR;  Service: General;  Laterality: N/A;   LUMBAR FUSION  2010   has had 2 low back surgeries    RIGHT HYDROCELECTOMY  07-28-2001   SHOULDER ARTHROSCOPY WITH ROTATOR CUFF REPAIR Left 1990's    Family History  Problem Relation Age of Onset   Brain cancer Mother        tumor   Diabetes Mother    Kidney Stones Father    Diabetes Sister    Heart disease Sister    GI problems Sister    Cancer Brother        prostate   Cancer Brother        lymphoma    Heart disease Brother    Blindness Son    Arthritis Son        trouble with legs   Heart disease Paternal Uncle    Arthritis Son        shoulder and back       Controlled substance contract: n/a     Review of Systems  Constitutional:  Negative for appetite change and fatigue.  HENT:  Negative for ear pain.   Eyes:   Negative for pain and visual disturbance.  Respiratory:  Negative for chest tightness and shortness of breath.   Cardiovascular:  Positive for leg swelling. Negative for chest pain and palpitations.  Gastrointestinal:  Negative for abdominal pain.  Endocrine: Negative for cold intolerance and heat intolerance.  Genitourinary:  Negative for difficulty urinating.  Musculoskeletal:  Positive for back pain.       Chronic back pain; uses cane  Skin:  Negative for color change.  Neurological:  Negative for dizziness and headaches.       Neuropathy  makes it feel like something is crawling up and down his legs  Hematological:  Negative for adenopathy.  Psychiatric/Behavioral:  Negative for confusion and sleep disturbance.        Objective:   Physical Exam Vitals and nursing note reviewed.  Constitutional:      General: He is not in acute distress.    Appearance: Normal appearance. He is well-developed and well-groomed. He is obese. He is not ill-appearing.  HENT:     Head: Normocephalic and atraumatic.     Right Ear: Tympanic membrane normal.     Left Ear: Tympanic membrane normal.     Nose: Nose normal.     Mouth/Throat:     Mouth: Mucous membranes are moist.     Pharynx: Oropharynx is clear.  Eyes:     Conjunctiva/sclera: Conjunctivae normal.     Pupils: Pupils are equal, round, and reactive to light.  Neck:     Thyroid : No thyroid  mass, thyromegaly or thyroid  tenderness.     Vascular: No carotid bruit or JVD.  Cardiovascular:     Rate and Rhythm: Normal rate and regular rhythm.     Pulses: Normal pulses.     Heart sounds: Normal heart sounds.  Pulmonary:     Effort: Pulmonary effort is normal. No respiratory distress.     Breath sounds: Normal breath sounds. No wheezing, rhonchi or rales.  Abdominal:     General: Bowel sounds are normal. There is no distension.     Palpations: Abdomen is soft. There is no hepatomegaly, splenomegaly or mass.     Tenderness: There is no  abdominal tenderness. There is no guarding.     Hernia: A hernia (soft umbilical hernia) is present.  Musculoskeletal:        General: Normal range of motion.     Cervical back: Normal range of motion and neck supple. No tenderness.     Right lower leg: 2+ Pitting Edema present.     Left lower leg: 2+ Pitting Edema present.  Lymphadenopathy:     Cervical: No cervical adenopathy.  Skin:    General: Skin is warm and dry.     Capillary Refill: Capillary refill takes less than 2 seconds.  Neurological:     General: No focal deficit present.     Mental Status: He is alert and oriented to person, place, and time.     Comments: Ambulates with cane  Psychiatric:        Mood and Affect: Mood normal.        Behavior: Behavior normal.    BP 125/70   Pulse 90   Temp 98.6 F (37 C) (Temporal)   Ht 5' 8 (1.727 m)   Wt (!) 301 lb (136.5 kg)   SpO2 98%   BMI 45.77 kg/m    A1C today 5.6%      Assessment & Plan:   Evan Smith comes in today with chief complaint of annual physical  Diagnosis and orders addressed:  1. Essential hypertension Low sodium diet - CBC with Differential/Platelet - CMP14+EGFR  2. Hyperlipidemia associated with type 2 diabetes mellitus (HCC) Low fat diet - Lipid panel  3. Type 2 diabetes mellitus with diabetic polyneuropathy, without long-term current use of insulin (HCC) Continue  to watch carbs in diet - Bayer DCA Hb A1c Waived - Microalbumin / creatinine urine ratio  4. Screening for prostate cancer Labs pending - PSA, total and free  5. Gastroesophageal reflux disease with esophagitis without hemorrhage Avoid spicy  foods Do not eat 2 hours prior to bedtime   6. Obstructive sleep apnea syndrome Wear cpap nightly  7. Neuropathy Continue neurontin - increased to 400mg  TID Keep check of feet daily  8. Severe obesity (BMI >= 40) (HCC) Discussed diet and exercise for person with BMI >25 Will recheck weight in 3-6 months    Labs  pending Health Maintenance reviewed Diet and exercise encouraged  Follow up plan: 6 months   Mary-Margaret Gladis, FNP

## 2023-12-24 LAB — CBC WITH DIFFERENTIAL/PLATELET
Basophils Absolute: 0 x10E3/uL (ref 0.0–0.2)
Basos: 1 %
EOS (ABSOLUTE): 0.1 x10E3/uL (ref 0.0–0.4)
Eos: 3 %
Hematocrit: 40.4 % (ref 37.5–51.0)
Hemoglobin: 13.4 g/dL (ref 13.0–17.7)
Immature Grans (Abs): 0 x10E3/uL (ref 0.0–0.1)
Immature Granulocytes: 0 %
Lymphocytes Absolute: 1.3 x10E3/uL (ref 0.7–3.1)
Lymphs: 34 %
MCH: 32 pg (ref 26.6–33.0)
MCHC: 33.2 g/dL (ref 31.5–35.7)
MCV: 96 fL (ref 79–97)
Monocytes Absolute: 0.4 x10E3/uL (ref 0.1–0.9)
Monocytes: 10 %
Neutrophils Absolute: 2.1 x10E3/uL (ref 1.4–7.0)
Neutrophils: 51 %
Platelets: 199 x10E3/uL (ref 150–450)
RBC: 4.19 x10E6/uL (ref 4.14–5.80)
RDW: 12.8 % (ref 11.6–15.4)
WBC: 3.9 x10E3/uL (ref 3.4–10.8)

## 2023-12-24 LAB — CMP14+EGFR
ALT: 22 IU/L (ref 0–44)
AST: 30 IU/L (ref 0–40)
Albumin: 4.3 g/dL (ref 3.9–4.9)
Alkaline Phosphatase: 69 IU/L (ref 47–123)
BUN/Creatinine Ratio: 10 (ref 10–24)
BUN: 18 mg/dL (ref 8–27)
Bilirubin Total: 0.2 mg/dL (ref 0.0–1.2)
CO2: 19 mmol/L — AB (ref 20–29)
Calcium: 9.3 mg/dL (ref 8.6–10.2)
Chloride: 98 mmol/L (ref 96–106)
Creatinine, Ser: 1.79 mg/dL — AB (ref 0.76–1.27)
Globulin, Total: 2.8 g/dL (ref 1.5–4.5)
Glucose: 96 mg/dL (ref 70–99)
Potassium: 5 mmol/L (ref 3.5–5.2)
Sodium: 136 mmol/L (ref 134–144)
Total Protein: 7.1 g/dL (ref 6.0–8.5)
eGFR: 41 mL/min/1.73 — AB (ref >59)

## 2023-12-24 LAB — LIPID PANEL
Cholesterol, Total: 143 mg/dL (ref 100–199)
HDL: 44 mg/dL (ref >39)
LDL CALC COMMENT:: 3.3 ratio (ref 0.0–5.0)
LDL Chol Calc (NIH): 37 mg/dL (ref 0–99)
Triglycerides: 431 mg/dL — AB (ref 0–149)
VLDL Cholesterol Cal: 62 mg/dL — AB (ref 5–40)

## 2023-12-25 ENCOUNTER — Ambulatory Visit: Payer: Self-pay | Admitting: Nurse Practitioner

## 2024-02-23 ENCOUNTER — Other Ambulatory Visit: Payer: Self-pay | Admitting: Nurse Practitioner

## 2024-03-09 ENCOUNTER — Ambulatory Visit

## 2024-03-09 VITALS — Ht 68.0 in | Wt 301.0 lb

## 2024-03-09 DIAGNOSIS — Z0000 Encounter for general adult medical examination without abnormal findings: Secondary | ICD-10-CM | POA: Diagnosis not present

## 2024-03-09 NOTE — Patient Instructions (Signed)
 [name],  Thank you for taking the time for your Medicare Wellness Visit. I appreciate your continued commitment to your health goals. Please review the care plan we discussed, and feel free to reach out if I can assist you further.  Please note that Annual Wellness Visits do not include a physical exam. Some assessments may be limited, especially if the visit was conducted virtually. If needed, we may recommend an in-person follow-up with your provider.  Ongoing Care Seeing your primary care provider every 3 to 6 months helps us  monitor your health and provide consistent, personalized care.   Referrals If a referral was made during today's visit and you haven't received any updates within two weeks, please contact the referred provider directly to check on the status.  Recommended Screenings:  Health Maintenance  Topic Date Due   Eye exam for diabetics  01/11/2021   COVID-19 Vaccine (4 - 2025-26 season) 10/13/2023   Hemoglobin A1C  06/21/2024   Kidney health urinalysis for diabetes  06/26/2024   Yearly kidney function blood test for diabetes  12/22/2024   Complete foot exam   12/22/2024   Medicare Annual Wellness Visit  03/09/2025   Cologuard (Stool DNA test)  05/06/2026   DTaP/Tdap/Td vaccine (2 - Td or Tdap) 04/01/2032   Colon Cancer Screening  05/05/2033   Pneumococcal Vaccine for age over 9  Completed   Flu Shot  Completed   Hepatitis C Screening  Completed   Zoster (Shingles) Vaccine  Completed   Meningitis B Vaccine  Aged Out       03/09/2024    4:00 PM  Advanced Directives  Does Patient Have a Medical Advance Directive? No  Would patient like information on creating a medical advance directive? Yes (MAU/Ambulatory/Procedural Areas - Information given)   Information on Advanced Care Planning can be found at [place]  Secretary of [facility] Advance Health Care Directives Advance Health Care Directives ([email or web address] .  Vision: Annual vision screenings are recommended  for early detection of glaucoma, cataracts, and diabetic retinopathy. These exams can also reveal signs of chronic conditions such as diabetes and high blood pressure.  Dental: Annual dental screenings help detect early signs of oral cancer, gum disease, and other conditions linked to overall health, including heart disease and diabetes.  Please see the attached documents for additional preventive care recommendations.

## 2024-03-09 NOTE — Progress Notes (Signed)
 "  Chief Complaint  Patient presents with   Medicare Wellness     Subjective:   Evan Smith is a 69 y.o. male who presents for a Medicare Annual Wellness Visit.  Visit info / Clinical Intake: Medicare Wellness Visit Type:: Subsequent Annual Wellness Visit Persons participating in visit and providing information:: patient Medicare Wellness Visit Mode:: Telephone If telephone:: video declined Since this visit was completed virtually, some vitals may be partially provided or unavailable. Missing vitals are due to the limitations of the virtual format.: Documented vitals are patient reported If Telephone or Video please confirm:: I connected with patient using audio/video enable telemedicine. I verified patient identity with two identifiers, discussed telehealth limitations, and patient agreed to proceed. Patient Location:: home Provider Location:: home office Interpreter Needed?: No Pre-visit prep was completed: yes AWV questionnaire completed by patient prior to visit?: no Living arrangements:: (!) lives alone Patient's Overall Health Status Rating: good Typical amount of pain: some Does pain affect daily life?: no Are you currently prescribed opioids?: no  Dietary Habits and Nutritional Risks How many meals a day?: 3 Eats fruit and vegetables daily?: yes Most meals are obtained by: preparing own meals; eating out In the last 2 weeks, have you had any of the following?: none Diabetic:: (!) yes Any non-healing wounds?: no How often do you check your BS?: 4 Would you like to be referred to a Nutritionist or for Diabetic Management? : no  Functional Status Activities of Daily Living (to include ambulation/medication): Independent Ambulation: Independent with device- listed below Home Assistive Devices/Equipment: Wheelchair; Walker (specify Type); Cane Medication Administration: Independent Home Management (perform basic housework or laundry): Independent Manage your own  finances?: yes Primary transportation is: driving Concerns about vision?: no *vision screening is required for WTM* Concerns about hearing?: no  Fall Screening Falls in the past year?: 0 Number of falls in past year: 0 Was there an injury with Fall?: 0 Fall Risk Category Calculator: 0 Patient Fall Risk Level: Low Fall Risk  Fall Risk Patient at Risk for Falls Due to: Impaired balance/gait; Impaired mobility Fall risk Follow up: Education provided; Falls prevention discussed; Falls evaluation completed  Home and Transportation Safety: All rugs have non-skid backing?: N/A, no rugs All stairs or steps have railings?: N/A, no stairs Grab bars in the bathtub or shower?: yes Have non-skid surface in bathtub or shower?: yes Good home lighting?: yes Regular seat belt use?: yes Hospital stays in the last year:: (!) yes How many hospital stays:: 1 Reason: Confusion  Cognitive Assessment Difficulty concentrating, remembering, or making decisions? : no Will 6CIT or Mini Cog be Completed: yes What year is it?: 0 points What month is it?: 0 points Give patient an address phrase to remember (5 components): [id number] [place] About what time is it?: 0 points Count backwards from 20 to 1: 0 points Say the months of the year in reverse: 0 points Repeat the address phrase from earlier: 2 points 6 CIT Score: 2 points  Advance Directives (For Healthcare) Does Patient Have a Medical Advance Directive?: No Would patient like information on creating a medical advance directive?: Yes (MAU/Ambulatory/Procedural Areas - Information given)  Reviewed/Updated  Reviewed/Updated: Reviewed All (Medical, Surgical, Family, Medications, Allergies, Care Teams, Patient Goals)    Allergies (verified) Hydrocodone  and Hydrocodeine [dihydrocodeine]   Current Medications (verified) Outpatient Encounter Medications as of 03/09/2024  Medication Sig   Accu-Chek FastClix Lancets MISC Check BS QID Dx  E11.9   Alcohol  Swabs (DROPSAFE ALCOHOL  PREP)  70 % PADS Check BS QID Dx E11.9   amLODipine  (NORVASC ) 5 MG tablet Take 1 tablet (5 mg total) by mouth daily.   aspirin  81 MG tablet Take 1 tablet (81 mg total) by mouth daily.   Blood Glucose Calibration (TRUE METRIX LEVEL 1) Low SOLN USE AS DIRECTED   Blood Glucose Monitoring Suppl (TRUE METRIX AIR GLUCOSE METER) w/Device KIT 1 Device by Does not apply route daily. Dx E11.9   Blood Glucose Monitoring Suppl (TRUE METRIX AIR GLUCOSE METER) w/Device KIT USE UP TO FOUR TIMES DAILY AS DIRECTED.   Blood Glucose Monitoring Suppl DEVI 1 each by Does not apply route in the morning, at noon, and at bedtime. May substitute to any manufacturer covered by patient's insurance.   cetirizine  (ZYRTEC  ALLERGY) 10 MG tablet Take 1 tablet (10 mg total) by mouth daily.   Cholecalciferol (VITAMIN D3) 25 MCG (1000 UT) CAPS Take 1 capsule (1,000 Units total) by mouth daily.   fluticasone  (FLONASE ) 50 MCG/ACT nasal spray USE 2 SPRAYS IN EACH NOSTRIL EVERY DAY   gabapentin  (NEURONTIN ) 400 MG capsule Take 1 capsule (400 mg total) by mouth 3 (three) times daily.   glucose blood (TRUE METRIX BLOOD GLUCOSE TEST) test strip Check BS QID Dx E11.9   lisinopril -hydrochlorothiazide  (ZESTORETIC ) 20-25 MG tablet Take 1 tablet by mouth daily.   loratadine  (CLARITIN ) 10 MG tablet Take 1 tablet (10 mg total) by mouth daily.   metFORMIN  (GLUCOPHAGE -XR) 500 MG 24 hr tablet TAKE 1 TABLET EVERY DAY WITH BREAKFAST   omeprazole  (PRILOSEC) 40 MG capsule Take 1 capsule (40 mg total) by mouth every morning.   potassium gluconate 595 (99 K) MG TABS tablet Take 1 tablet (595 mg total) by mouth daily.   sildenafil  (REVATIO ) 20 MG tablet Take 1-2 tablets (20-40 mg total) by mouth as needed.   simvastatin  (ZOCOR ) 40 MG tablet Take 1 tablet (40 mg total) by mouth at bedtime.   sodium chloride  (OCEAN) 0.65 % SOLN nasal spray Place 1 spray into both nostrils as needed for congestion.   No  facility-administered encounter medications on file as of 03/09/2024.    History: Past Medical History:  Diagnosis Date   Allergic rhinitis    by referral on 11/02/19/   Arthritis    Cataract    Chronic left shoulder pain    From [facility] received 11/02/19/   DDD (degenerative disc disease)    Dyspnea    on exertion    GERD (gastroesophageal reflux disease)    Hyperlipidemia    Hypertension    Intestinal flu 2000   OSA on CPAP    MODERATE OSA PER STUDY 2010   Perianal fistula    PONV (postoperative nausea and vomiting)    and hear to wake   Severe obesity (BMI >= 40) (HCC)    Received from [facility] on 11/02/19/   Type 2 diabetes mellitus with diabetic polyneuropathy, without long-term current use of insulin (HCC)    pt. states he's borderline   Past Surgical History:  Procedure Laterality Date   ANTERIOR CERVICAL DECOMP/DISCECTOMY FUSION N/A 02/15/2020   Procedure: ANTERIOR CERVICAL DECOMPRESSON FUSION CERVICAL THREE-FOUR, CERVICAL FOUR-FIVE,CERVICAL FIVE-SIX.;  Surgeon: Lanis Pupa, MD;  Location: [facility] OR;  Service: Neurosurgery;  Laterality: N/A;  anterior   CERVICAL FUSION  1990's   EVALUATION UNDER ANESTHESIA WITH ANAL FISTULECTOMY N/A 08/03/2012   Procedure: EXAM UNDER ANESTHESIA WITH ANAL FISTULECTOMY ;  Surgeon: Bernarda Ned, MD;  Location[facility];  Service: General;  Laterality: N/A;  INCISION AND DRAINAGE PERIRECTAL ABSCESS  02/13/2012   Procedure: IRRIGATION AND DEBRIDEMENT PERIRECTAL ABSCESS;  Surgeon: Krystal CHRISTELLA Spinner, MD;  Location: [facility] OR;  Service: General;  Laterality: N/A;   LUMBAR FUSION  2010   has had 2 low back surgeries    RIGHT HYDROCELECTOMY  07-28-2001   SHOULDER ARTHROSCOPY WITH ROTATOR CUFF REPAIR Left 1990's   Family History  Problem Relation Age of Onset   Brain cancer Mother        tumor   Diabetes Mother    Kidney Stones Father    Diabetes Sister    Heart disease Sister    GI problems Sister    Cancer Brother         prostate   Cancer Brother        lymphoma    Heart disease Brother    Blindness Son    Arthritis Son        trouble with legs   Heart disease Paternal Uncle    Arthritis Son        shoulder and back    Social History   Occupational History   Occupation: Disabled    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   Tobacco Use   Smoking status: Former    Current packs/day: 0.00    Average packs/day: 0.5 packs/day for 25.0 years (12.5 ttl pk-yrs)    Types: Cigarettes    Start date: 02/12/1979    Quit date: 02/12/2004    Years since quitting: 20.0   Smokeless tobacco: Never  Vaping Use   Vaping status: Never Used  Substance and Sexual Activity   Alcohol  use: Yes    Comment: occassional   Drug use: No   Sexual activity: Yes   Tobacco Counseling Counseling given: Not Answered  SDOH Screenings   Food Insecurity: No Food Insecurity (03/09/2024)  Housing: Low Risk (03/09/2024)  Transportation Needs: No Transportation Needs (03/09/2024)  Utilities: Not At Risk (03/09/2024)  Alcohol  Screen: Low Risk (08/17/2023)  Depression (PHQ2-9): Low Risk (03/09/2024)  Financial Resource Strain: Medium Risk (09/01/2023)   Received from [facility]  Physical Activity: Inactive (03/09/2024)  Social Connections: Moderately Isolated (03/09/2024)  Stress: No Stress Concern Present (03/09/2024)  Tobacco Use: Medium Risk (03/09/2024)  Health Literacy: Adequate Health Literacy (03/09/2024)   See flowsheets for full screening details  Depression Screen PHQ 2 & 9 Depression Scale- Over the past 2 weeks, how often have you been bothered by any of the following problems? Little interest or pleasure in doing things: 0 Feeling down, depressed, or hopeless (PHQ Adolescent also includes...irritable): 0 PHQ-2 Total Score: 0     Goals Addressed             This Visit's Progress    Prevent falls   On track            Objective:    Today's Vitals   03/09/24 1553  Weight: (!) 301 lb (136.5 kg)   Height: 5' 8 (1.727 m)   Body mass index is 45.77 kg/m.  Hearing/Vision screen No results found. Immunizations and Health Maintenance Health Maintenance  Topic Date Due   OPHTHALMOLOGY EXAM  01/11/2021   COVID-19 Vaccine (4 - 2025-26 season) 10/13/2023   HEMOGLOBIN A1C  06/21/2024   Diabetic kidney evaluation - Urine ACR  06/26/2024   Diabetic kidney evaluation - eGFR measurement  12/22/2024   FOOT EXAM  12/22/2024   Medicare Annual Wellness (AWV)  03/09/2025   Fecal DNA (Cologuard)  05/06/2026  DTaP/Tdap/Td (2 - Td or Tdap) 04/01/2032   Colonoscopy  05/05/2033   Pneumococcal Vaccine: 50+ Years  Completed   Influenza Vaccine  Completed   Hepatitis C Screening  Completed   Zoster Vaccines- Shingrix   Completed   Meningococcal B Vaccine  Aged Out        Assessment/Plan:  This is a routine wellness examination for Deonte.  Patient Care Team: Gladis Mustard, FNP as PCP - General (Family Medicine) Eletha Boas, MD as Consulting Physician (General Surgery) Gust Wolm BRAVO, MD (Orthopedic Surgery) Alix Charleston, MD as Consulting Physician (Neurosurgery) Leeann Hover, MD as Attending Physician (Neurosurgery) Roddie Bring, DPM as Consulting Physician (Podiatry)  I have personally reviewed and noted the following in the patients chart:   Medical and social history Use of alcohol , tobacco or illicit drugs  Current medications and supplements including opioid prescriptions. Functional ability and status Nutritional status Physical activity Advanced directives List of other physicians Hospitalizations, surgeries, and ER visits in previous 12 months Vitals Screenings to include cognitive, depression, and falls Referrals and appointments  No orders of the defined types were placed in this encounter.  In addition, I have reviewed and discussed with patient certain preventive protocols, quality metrics, and best practice recommendations. A written personalized  care plan for preventive services as well as general preventive health recommendations were provided to patient.   Lavelle Charmaine Browner, LPN   8/72/7973   Return in 1 year (on 03/09/2025).  After Visit Summary: (MyChart) Due to this being a telephonic visit, the after visit summary with patients personalized plan was offered to patient via MyChart   Nurse Notes: No voiced or noted concerns at this time Patient advised to keep follow-up appointment with PCP (06/21/24)  "

## 2024-06-21 ENCOUNTER — Ambulatory Visit: Admitting: Nurse Practitioner
